# Patient Record
Sex: Female | Born: 1995 | Race: White | Hispanic: No | Marital: Single | Smoking: Current every day smoker
Health system: Southern US, Community
[De-identification: ages and names within clinical notes are randomized; demographics above are authoritative.]

## PROBLEM LIST (undated history)

## (undated) DIAGNOSIS — N390 Urinary tract infection, site not specified: Secondary | ICD-10-CM

## (undated) DIAGNOSIS — F4310 Post-traumatic stress disorder, unspecified: Secondary | ICD-10-CM

## (undated) DIAGNOSIS — I10 Essential (primary) hypertension: Secondary | ICD-10-CM

## (undated) DIAGNOSIS — T7840XA Allergy, unspecified, initial encounter: Secondary | ICD-10-CM

## (undated) DIAGNOSIS — F603 Borderline personality disorder: Secondary | ICD-10-CM

## (undated) DIAGNOSIS — F319 Bipolar disorder, unspecified: Secondary | ICD-10-CM

## (undated) DIAGNOSIS — F32A Depression, unspecified: Secondary | ICD-10-CM

## (undated) DIAGNOSIS — F419 Anxiety disorder, unspecified: Secondary | ICD-10-CM

## (undated) DIAGNOSIS — H1010 Acute atopic conjunctivitis, unspecified eye: Secondary | ICD-10-CM

## (undated) DIAGNOSIS — H52209 Unspecified astigmatism, unspecified eye: Secondary | ICD-10-CM

## (undated) DIAGNOSIS — F329 Major depressive disorder, single episode, unspecified: Secondary | ICD-10-CM

## (undated) DIAGNOSIS — E669 Obesity, unspecified: Secondary | ICD-10-CM

## (undated) HISTORY — DX: Unspecified astigmatism, unspecified eye: H52.209

## (undated) HISTORY — DX: Depression, unspecified: F32.A

## (undated) HISTORY — DX: Obesity, unspecified: E66.9

## (undated) HISTORY — DX: Allergy, unspecified, initial encounter: T78.40XA

## (undated) HISTORY — DX: Anxiety disorder, unspecified: F41.9

## (undated) HISTORY — DX: Urinary tract infection, site not specified: N39.0

## (undated) HISTORY — DX: Post-traumatic stress disorder, unspecified: F43.10

## (undated) HISTORY — DX: Essential (primary) hypertension: I10

## (undated) HISTORY — DX: Major depressive disorder, single episode, unspecified: F32.9

---
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
## 2015-04-05 ENCOUNTER — Emergency Department (HOSPITAL_COMMUNITY)
Admission: EM | Admit: 2015-04-05 | Discharge: 2015-04-05 | Disposition: A | Discharge status: Home / Self Care | Payer: Self-pay | Source: Home / Self Care | Attending: Emergency Medicine | Admitting: Emergency Medicine

## 2015-04-05 ENCOUNTER — Encounter (HOSPITAL_COMMUNITY): Payer: Self-pay | Admitting: *Deleted

## 2015-04-05 ENCOUNTER — Emergency Department (HOSPITAL_COMMUNITY)
Admission: EM | Admit: 2015-04-05 | Discharge: 2015-04-05 | Disposition: A | Discharge status: Home / Self Care | Payer: Self-pay | Attending: Emergency Medicine | Admitting: Emergency Medicine

## 2015-04-05 DIAGNOSIS — E669 Obesity, unspecified: Secondary | ICD-10-CM | POA: Insufficient documentation

## 2015-04-05 DIAGNOSIS — F419 Anxiety disorder, unspecified: Secondary | ICD-10-CM

## 2015-04-05 DIAGNOSIS — F4310 Post-traumatic stress disorder, unspecified: Secondary | ICD-10-CM

## 2015-04-05 DIAGNOSIS — F410 Panic disorder [episodic paroxysmal anxiety] without agoraphobia: Secondary | ICD-10-CM | POA: Insufficient documentation

## 2015-04-05 MED ORDER — HYDROXYZINE HCL 50 MG PO TABS: 50.0000 mg | ORAL_TABLET | Freq: Four times a day (QID) | ORAL | Status: DC

## 2015-04-05 MED ORDER — QUETIAPINE FUMARATE ER 300 MG PO TB24
300.0000 mg | ORAL_TABLET | Freq: Every day | ORAL | Status: DC
Start: 2015-04-05 — End: 2016-11-07
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
## 2015-04-05 NOTE — ED Provider Notes (Signed)
CSN: [id number]     Arrival date & time 04/05/15  1405 History   First MD Initiated Contact with Patient 04/05/15 1514     Chief Complaint  Patient presents with  . Panic Attack   (Consider location/radiation/quality/duration/timing/severity/associated sxs/prior Treatment) HPI History of PTSD, anxiety. Moved from [place]. Out of meds. Not sleeping. Anxiety attack/panic attack last night.  Continues to feel some left over this symptoms today. Unable to see PCP, as she does not have one at this time. States she has not been seen at mental health. Looking for 2 meds. Seroquerl, and visteril.  Past Medical History  Diagnosis Date  . Allergy   . Obesity   . Urinary tract infection 11/16/93    recurrent, nl U/S, [facility])  . Anxiety 06/04/2011    panic attacks  . Depression   . Astigmatism     glasses  . Post traumatic stress disorder    Past Surgical History  Procedure Laterality Date  . Inguinal hernia repair  7/95    bilat exploration, left repair  . Umbilical hernia repair  7/95    repaired as infant   Family History  Problem Relation Age of Onset  . Hypertension Mother   . Hypothyroidism Mother   . Hypertension Father   . Brain cancer Maternal Grandfather   . Cancer Other    Social History  Substance Use Topics  . Smoking status: Passive Smoke Exposure - Never Smoker  . Smokeless tobacco: Never Used  . Alcohol Use: No   OB History    No data available     Review of Systems Anxiety, ptsd, medication refill Allergies  Penicillins  Home Medications   Prior to Admission medications   Medication Sig Start Date End Date Taking? Authorizing Provider  aspirin 325 MG tablet Take 325 mg by mouth every 6 (six) hours as needed. For pain.    Historical Provider, MD  citalopram (CELEXA) 40 MG tablet Take 40 mg by mouth daily.    Historical Provider, MD  hydrOXYzine (ATARAX/VISTARIL) 50 MG tablet Take 1 tablet (50 mg total) by mouth every 6 (six) hours. 04/05/15   Tharon Aquas, PA   [facility] 1/35 1-35 MG-MCG tablet take 1 tablet by mouth once daily 09/27/11   Preston Fleeting, MD  Olopatadine HCl 0.2 % SOLN Apply 1 drop to eye 2 (two) times daily as needed (for itchy , watery, irritated eyes). 10/10/11   Preston Fleeting, MD  PRESCRIPTION MEDICATION Take 1 capsule by mouth at bedtime. "Nightmare" medication, starts with a P.    Historical Provider, MD  QUEtiapine (SEROQUEL XR) 300 MG 24 hr tablet Take 1 tablet (300 mg total) by mouth at bedtime. 04/05/15   Tharon Aquas, PA  traZODone (DESYREL) 100 MG tablet Take 100 mg by mouth at bedtime.    Historical Provider, MD   Meds Ordered and Administered this Visit  Medications - No data to display  BP 110/70 mmHg  Pulse 107  Temp(Src) 98.6 F (37 C) (Oral)  Resp 18  SpO2 98%  LMP 04/05/2015 No data found.   Physical Exam NURSES NOTES AND VITAL SIGNS REVIEWED. CONSTITUTIONAL: Well developed, well nourished, no acute distress HEENT: normocephalic, atraumatic, right and left TM's are normal EYES: Conjunctiva normal NECK:normal ROM, supple, no adenopathy PULMONARY:No respiratory distress, normal effort, Lungs: CTAb/l, no wheezes, or increased work of breathing CARDIOVASCULAR: RRR, no murmur ABDOMEN: soft, ND, NT, +'ve BS MUSCULOSKELETAL: Normal ROM of all extremities,  SKIN: warm and dry  without rash PSYCHIATRIC: Mood and affect, behavior are normal, may be a bit depressed, no suidical or homicidal thoughts. No desire to hurt self.  ED Course  Procedures (including critical care time)  Labs Review Labs Reviewed - No data to display  Imaging Review No results found.   Visual Acuity Review  Right Eye Distance:   Left Eye Distance:   Bilateral Distance:    Right Eye Near:   Left Eye Near:    Bilateral Near:         MDM   1. Anxiety   2. PTSD (post-traumatic stress disorder)     Patient is reassured that there are no issues that require transfer to higher level of care at this time or additional  tests. Patient is advised to continue home symptomatic treatment. Patient is advised that if there are new or worsening symptoms to attend the emergency department, contact primary care provider, or return to UC. Instructions of care provided discharged home in stable condition. Return to work/school note provided.   THIS NOTE WAS GENERATED USING A VOICE RECOGNITION SOFTWARE PROGRAM. ALL REASONABLE EFFORTS  WERE MADE TO PROOFREAD THIS DOCUMENT FOR ACCURACY.  I have verbally reviewed the discharge instructions with the patient. A printed AVS was given to the patient.  All questions were answered prior to discharge.      Tharon AquasFrank C Donja Tipping, PA 04/05/15 1742
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
## 2015-04-05 NOTE — ED Notes (Signed)
Pt reports    Feeling  Anxious  Reports  Recent  Stressors to  [employer]  Financial    Domestic  And      Not   On  Her meds    For  About  1  Month      Pt   Reports  A  Sensation of  heavyness in her  Chest    And  Nervous   She  Was  In the  Er     Today  And  Left  Without  Being  Seen  Due  To the  Long  Wait

## 2016-08-17 ENCOUNTER — Emergency Department: Payer: Self-pay

## 2016-08-17 ENCOUNTER — Emergency Department
Admission: EM | Admit: 2016-08-17 | Discharge: 2016-08-17 | Disposition: A | Discharge status: Home / Self Care | Payer: Self-pay | Attending: Emergency Medicine | Admitting: Emergency Medicine

## 2016-08-17 ENCOUNTER — Encounter: Payer: Self-pay | Admitting: Emergency Medicine

## 2016-08-17 DIAGNOSIS — R109 Unspecified abdominal pain: Secondary | ICD-10-CM

## 2016-08-17 DIAGNOSIS — N3000 Acute cystitis without hematuria: Secondary | ICD-10-CM | POA: Insufficient documentation

## 2016-08-17 DIAGNOSIS — R300 Dysuria: Secondary | ICD-10-CM | POA: Insufficient documentation

## 2016-08-17 DIAGNOSIS — F17210 Nicotine dependence, cigarettes, uncomplicated: Secondary | ICD-10-CM | POA: Insufficient documentation

## 2016-08-17 DIAGNOSIS — Z79899 Other long term (current) drug therapy: Secondary | ICD-10-CM | POA: Insufficient documentation

## 2016-08-17 DIAGNOSIS — Z7982 Long term (current) use of aspirin: Secondary | ICD-10-CM | POA: Insufficient documentation

## 2016-08-17 HISTORY — DX: Acute atopic conjunctivitis, unspecified eye: H10.10

## 2016-08-17 LAB — URINALYSIS, COMPLETE (UACMP) WITH MICROSCOPIC
BACTERIA UA: NONE SEEN
BILIRUBIN URINE: NEGATIVE
GLUCOSE, UA: NEGATIVE mg/dL
HGB URINE DIPSTICK: NEGATIVE
Ketones, ur: 20 mg/dL — AB
LEUKOCYTES UA: NEGATIVE
NITRITE: NEGATIVE
PH: 5 (ref 5.0–8.0)
Protein, ur: 30 mg/dL — AB
Specific Gravity, Urine: 1.026 (ref 1.005–1.030)

## 2016-08-17 LAB — BASIC METABOLIC PANEL
Anion gap: 10 (ref 5–15)
BUN: 14 mg/dL (ref 6–20)
CO2: 24 mmol/L (ref 22–32)
Calcium: 9.5 mg/dL (ref 8.9–10.3)
Chloride: 105 mmol/L (ref 101–111)
Creatinine, Ser: 0.74 mg/dL (ref 0.44–1.00)
GFR calc Af Amer: >60 mL/min (ref >60)
GFR calc non Af Amer: >60 mL/min (ref >60)
GLUCOSE: 88 mg/dL (ref 65–99)
Potassium: 3.9 mmol/L (ref 3.5–5.1)
Sodium: 139 mmol/L (ref 135–145)

## 2016-08-17 LAB — CBC
HEMATOCRIT: 41.1 % (ref 35.0–47.0)
Hemoglobin: 14.3 g/dL (ref 12.0–16.0)
MCH: 32 pg (ref 26.0–34.0)
MCHC: 34.8 g/dL (ref 32.0–36.0)
MCV: 91.9 fL (ref 80.0–100.0)
Platelets: 245 10*3/uL (ref 150–440)
RBC: 4.48 MIL/uL (ref 3.80–5.20)
RDW: 13.5 % (ref 11.5–14.5)
WBC: 8.3 10*3/uL (ref 3.6–11.0)

## 2016-08-17 LAB — POCT PREGNANCY, URINE: Preg Test, Ur: NEGATIVE

## 2016-08-17 MED ORDER — CIPROFLOXACIN HCL 500 MG PO TABS
500.0000 mg | ORAL_TABLET | Freq: Two times a day (BID) | ORAL | 0 refills | Status: DC
Start: 2016-08-17 — End: 2016-11-07

## 2016-08-17 NOTE — ED Triage Notes (Signed)
Pt c/o left flank pain that started about 3 days ago.  C/o dysuria with urination. Pt states she has trouble urinating as well.  Pt c/o nausea as well.  Denies any vomiting

## 2016-08-17 NOTE — ED Notes (Signed)
Pt also states she may be pregnant as well

## 2016-08-17 NOTE — ED Provider Notes (Signed)
[facility] Emergency Department Provider Note   ____________________________________________    I have reviewed the triage vital signs and the nursing notes.   HISTORY  Chief Complaint Flank Pain     HPI [name] is a 21 y.o. female complains of dysuria and mild right flank pain. Patient reports the symptoms started 2 days ago. She has had urinary tract infections in the past but reports this was different because she has no discomfort in her right side. No history of abdominal surgeries. No fevers or chills. No nausea or vomiting. No history of kidney stones. She describes the pain as cramping and mild to moderate. She does report urinary frequency dysuria and urgency   Past Medical History:  Diagnosis Date  . Allergic conjunctivitis   . Allergy   . Anxiety 06/04/2011   panic attacks  . Astigmatism    glasses  . Depression   . Obesity   . Post traumatic stress disorder   . Urinary tract infection 01/31/1993   recurrent, nl [facility], [facility])    Patient Active Problem List   Diagnosis Date Noted  . Allergic conjunctivitis 10/13/2011  . Well adolescent visit 07/14/2011  . PTSD (post-traumatic stress disorder) 07/14/2011  . Dysmenorrhea in the adolescent 07/14/2011  . Allergic rhinitis, mild 07/14/2011  . Anxiety 06/04/2011  . Depression 06/04/2011  . BMI (body mass index), pediatric, 95-99% for age 105/08/2011    Past Surgical History:  Procedure Laterality Date  . INGUINAL HERNIA REPAIR  7/95   bilat exploration, left repair  . UMBILICAL HERNIA REPAIR  7/95   repaired as infant    Prior to Admission medications   Medication Sig Start Date End Date Taking? Authorizing Provider  aspirin 325 MG tablet Take 325 mg by mouth every 6 (six) hours as needed. For pain.    [provider]  ciprofloxacin (CIPRO) 500 MG tablet Take 1 tablet (500 mg total) by mouth 2 (two) times daily. 08/17/16   Jene Every, MD  citalopram (CELEXA) 40  MG tablet Take 40 mg by mouth daily.    [provider]  hydrOXYzine (ATARAX/VISTARIL) 50 MG tablet Take 1 tablet (50 mg total) by mouth every 6 (six) hours. 04/05/15   Tharon Aquas, PA  [facility] 1/35 1-35 MG-MCG tablet take 1 tablet by mouth once daily 09/27/11   Preston Fleeting, MD  Olopatadine HCl 0.2 % SOLN Apply 1 drop to eye 2 (two) times daily as needed (for itchy , watery, irritated eyes). 10/10/11   Preston Fleeting, MD  PRESCRIPTION MEDICATION Take 1 capsule by mouth at bedtime. "Nightmare" medication, starts with a P.    [provider]  QUEtiapine (SEROQUEL XR) 300 MG 24 hr tablet Take 1 tablet (300 mg total) by mouth at bedtime. 04/05/15   Tharon Aquas, PA  traZODone (DESYREL) 100 MG tablet Take 100 mg by mouth at bedtime.    [provider]     Allergies Penicillins  Family History  Problem Relation Age of Onset  . Hypertension Mother   . Hypothyroidism Mother   . Hypertension Father   . Brain cancer Maternal Grandfather   . Cancer Other     Social History Social History  Substance Use Topics  . Smoking status: Current Every Day Smoker    Packs/day: 1.00    Types: Cigarettes  . Smokeless tobacco: Never Used  . Alcohol use No    Review of Systems  Constitutional: No fever/chills Eyes:  No visual changes.  ENT: No sore throat. Cardiovascular: Denies chest pain. Respiratory: Denies shortness of breath. Gastrointestinal No nausea, no vomiting.   Genitourinary:As above Musculoskeletal: Negative for back pain. Skin: Negative for rash. Neurological: Negative for headaches   ____________________________________________   PHYSICAL EXAM:  VITAL SIGNS: ED Triage Vitals  Enc Vitals Group     BP 08/17/16 0941 125/84     Pulse Rate 08/17/16 0941 90     Resp 08/17/16 0941 16     Temp 08/17/16 0941 98.6 F (37 C)     Temp Source 08/17/16 0941 Oral     SpO2 08/17/16 0941 100 %     Weight 08/17/16 0941 59 kg (130 lb)     Height  08/17/16 0941 1.651 m (5\' 5" )     Head Circumference --      Peak Flow --      Pain Score 08/17/16 1319 0     Pain Loc --      Pain Edu? --      Excl. in GC? --     Constitutional: Alert and oriented. No acute distress. Pleasant and interactive Eyes: Conjunctivae are normal.   Nose: No congestion/rhinnorhea. Mouth/Throat: Mucous membranes are moist.    Cardiovascular: Normal rate, regular rhythm.   Good peripheral circulation. Respiratory: Normal respiratory effort.  No retractions. Gastrointestinal: Soft and nontender. No distention.  No CVA tenderness Genitourinary: deferred Musculoskeletal: No lower extremity tenderness nor edema.  Warm and well perfused Neurologic:  Normal speech and language. No gross focal neurologic deficits are appreciated.  Skin:  Skin is warm, dry and intact. No rash noted. Psychiatric: Mood and affect are normal. Speech and behavior are normal.  ____________________________________________   LABS (all labs ordered are listed, but only abnormal results are displayed)  Labs Reviewed  URINALYSIS, COMPLETE (UACMP) WITH MICROSCOPIC - Abnormal; Notable for the following:       Result Value   Color, Urine YELLOW (*)    APPearance HAZY (*)    Ketones, ur 20 (*)    Protein, ur 30 (*)    Squamous Epithelial / LPF 0-5 (*)    All other components within normal limits  URINE CULTURE  BASIC METABOLIC PANEL  CBC  POC URINE PREG, ED  POCT PREGNANCY, URINE   ____________________________________________  EKG  None ____________________________________________  RADIOLOGY  CT renal stone study normal ____________________________________________   PROCEDURES  Procedure(s) performed: No    Critical Care performed: No ____________________________________________   INITIAL IMPRESSION / ASSESSMENT AND PLAN / ED COURSE  Pertinent labs & imaging results that were available during my care of the patient were reviewed by me and considered in my medical  decision making (see chart for details).  Patient well-appearing no acute distress, lab work is reassuring, urinalysis shows some white blood cells, no obvious bacteria however her symptoms are consistent with UTI. CT stone study is negative will treat with antibiotics and have her follow-up with PCP. Return precautions discussed    ____________________________________________   FINAL CLINICAL IMPRESSION(S) / ED DIAGNOSES  Final diagnoses:  Acute right flank pain  Acute cystitis without hematuria      NEW MEDICATIONS STARTED DURING THIS VISIT:  Discharge Medication List as of 08/17/2016  1:46 PM    START taking these medications   Details  ciprofloxacin (CIPRO) 500 MG tablet Take 1 tablet (500 mg total) by mouth 2 (two) times daily., Starting Sun 08/17/2016, Print         Note:  This document was  prepared using Conservation officer, historic buildings and may include unintentional dictation errors.    Jene Every, MD 08/17/16 2013

## 2016-08-19 LAB — URINE CULTURE

## 2016-11-06 ENCOUNTER — Encounter (HOSPITAL_COMMUNITY): Payer: Self-pay

## 2016-11-06 ENCOUNTER — Inpatient Hospital Stay (HOSPITAL_COMMUNITY)
Admission: RE | Admit: 2016-11-06 | Discharge: 2016-11-11 | DRG: 885 | Disposition: A | Discharge status: Home / Self Care | Payer: Federal, State, Local not specified - Other | Attending: Psychiatry | Admitting: Psychiatry

## 2016-11-06 DIAGNOSIS — F17210 Nicotine dependence, cigarettes, uncomplicated: Secondary | ICD-10-CM | POA: Diagnosis present

## 2016-11-06 DIAGNOSIS — R45851 Suicidal ideations: Secondary | ICD-10-CM | POA: Diagnosis present

## 2016-11-06 DIAGNOSIS — F3132 Bipolar disorder, current episode depressed, moderate: Secondary | ICD-10-CM | POA: Diagnosis not present

## 2016-11-06 DIAGNOSIS — F1210 Cannabis abuse, uncomplicated: Secondary | ICD-10-CM | POA: Diagnosis present

## 2016-11-06 DIAGNOSIS — Z818 Family history of other mental and behavioral disorders: Secondary | ICD-10-CM | POA: Diagnosis not present

## 2016-11-06 DIAGNOSIS — F314 Bipolar disorder, current episode depressed, severe, without psychotic features: Principal | ICD-10-CM | POA: Diagnosis present

## 2016-11-06 DIAGNOSIS — F4310 Post-traumatic stress disorder, unspecified: Secondary | ICD-10-CM | POA: Diagnosis present

## 2016-11-06 DIAGNOSIS — G4700 Insomnia, unspecified: Secondary | ICD-10-CM | POA: Diagnosis present

## 2016-11-06 DIAGNOSIS — Z560 Unemployment, unspecified: Secondary | ICD-10-CM

## 2016-11-06 DIAGNOSIS — F419 Anxiety disorder, unspecified: Secondary | ICD-10-CM | POA: Diagnosis not present

## 2016-11-06 DIAGNOSIS — F329 Major depressive disorder, single episode, unspecified: Secondary | ICD-10-CM | POA: Diagnosis present

## 2016-11-06 DIAGNOSIS — Z23 Encounter for immunization: Secondary | ICD-10-CM

## 2016-11-06 DIAGNOSIS — Z915 Personal history of self-harm: Secondary | ICD-10-CM

## 2016-11-06 DIAGNOSIS — K5900 Constipation, unspecified: Secondary | ICD-10-CM | POA: Diagnosis present

## 2016-11-06 DIAGNOSIS — F410 Panic disorder [episodic paroxysmal anxiety] without agoraphobia: Secondary | ICD-10-CM | POA: Diagnosis present

## 2016-11-06 DIAGNOSIS — F39 Unspecified mood [affective] disorder: Secondary | ICD-10-CM | POA: Diagnosis not present

## 2016-11-06 MED ORDER — ALUM & MAG HYDROXIDE-SIMETH 200-200-20 MG/5ML PO SUSP: 30.0000 mL | ORAL | Status: DC | PRN

## 2016-11-06 MED ORDER — ACETAMINOPHEN 325 MG PO TABS
650.0000 mg | ORAL_TABLET | Freq: Four times a day (QID) | ORAL | Status: DC | PRN
  Administered 2016-11-09 – 2016-11-11 (×2): 650 mg via ORAL
  Filled 2016-11-06 (×2): qty 2

## 2016-11-06 MED ORDER — MAGNESIUM HYDROXIDE 400 MG/5ML PO SUSP
30.0000 mL | Freq: Every day | ORAL | Status: DC | PRN
  Administered 2016-11-10: 30 mL via ORAL
  Filled 2016-11-06: qty 30

## 2016-11-06 MED ORDER — TRAZODONE HCL 50 MG PO TABS
50.0000 mg | ORAL_TABLET | Freq: Every evening | ORAL | Status: DC | PRN
  Administered 2016-11-06 – 2016-11-10 (×4): 50 mg via ORAL
  Filled 2016-11-06: qty 1
  Filled 2016-11-06: qty 7
  Filled 2016-11-06 (×2): qty 1

## 2016-11-06 MED ORDER — HYDROXYZINE HCL 25 MG PO TABS
25.0000 mg | ORAL_TABLET | Freq: Three times a day (TID) | ORAL | Status: DC | PRN
  Administered 2016-11-06 – 2016-11-10 (×4): 25 mg via ORAL
  Filled 2016-11-06 (×2): qty 1
  Filled 2016-11-06: qty 10
  Filled 2016-11-06: qty 1

## 2016-11-06 NOTE — H&P (Signed)
Behavioral Health Medical Screening Exam  [name] is an 20 y.o. female.  Total Time spent with patient: 15 minutes  Psychiatric Specialty Exam: Physical Exam  Constitutional: She is oriented to person, place, and time. She appears well-developed and well-nourished. No distress.  HENT:  Head: Normocephalic and atraumatic.  Right Ear: External ear normal.  Left Ear: External ear normal.  Eyes: Conjunctivae are normal. Right eye exhibits no discharge. Left eye exhibits no discharge. No scleral icterus.  Cardiovascular: Normal rate, regular rhythm and normal heart sounds.   Respiratory: Breath sounds normal. No respiratory distress.  Musculoskeletal: Normal range of motion.  Neurological: She is alert and oriented to person, place, and time.  Skin: Skin is warm and dry. She is not diaphoretic.  Psychiatric: Her mood appears anxious. Her affect is blunt. Her affect is not labile. She is not aggressive, not hyperactive and not actively hallucinating. Thought content is not paranoid and not delusional. She expresses impulsivity and inappropriate judgment. She exhibits a depressed mood. She expresses suicidal ideation. She expresses no homicidal ideation. She expresses suicidal plans. She is attentive.    Review of Systems  Psychiatric/Behavioral: Positive for depression, substance abuse and suicidal ideas. Negative for hallucinations and memory loss. The patient is nervous/anxious and has insomnia.   All other systems reviewed and are negative.   Blood pressure 115/72, pulse (!) 110, resp. rate 18, SpO2 100 %.There is no height or weight on file to calculate BMI.  General Appearance: Casual and Well Groomed  Eye Contact:  Fair  Speech:  Clear and Coherent and Normal Rate  Volume:  Decreased  Mood:  Anxious, Depressed, Dysphoric, Hopeless, Irritable and Worthless  Affect:  Blunt  Thought Process:  Coherent and Goal Directed  Orientation:  Full (Time, Place, and Person)  Thought  Content:  Logical and Hallucinations: None  Suicidal Thoughts:  Yes.  with intent/plan  Homicidal Thoughts:  No  Memory:  Immediate;   Good Recent;   Good Remote;   Good  Judgement:  Fair  Insight:  Fair  Psychomotor Activity:  Normal  Concentration: Concentration: Fair and Attention Span: Fair  Recall:  Good  Fund of Knowledge:Good  Language: Good  Akathisia:  No  Handed:  Right  AIMS (if indicated):     Assets:  Communication Skills Desire for Improvement Financial Resources/Insurance Housing Intimacy Leisure Time Physical Health  Sleep:       Musculoskeletal: Strength & Muscle Tone: within normal limits Gait & Station: normal   Blood pressure 115/72, pulse (!) 110, resp. rate 18, SpO2 100 %.  Recommendations:  Based on my evaluation the patient does not appear to have an emergency medical condition.  Jackelyn Poling, NP 11/06/2016, 9:57 PM
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
## 2016-11-07 DIAGNOSIS — R4587 Impulsiveness: Secondary | ICD-10-CM

## 2016-11-07 DIAGNOSIS — F410 Panic disorder [episodic paroxysmal anxiety] without agoraphobia: Secondary | ICD-10-CM

## 2016-11-07 DIAGNOSIS — Z62811 Personal history of psychological abuse in childhood: Secondary | ICD-10-CM

## 2016-11-07 DIAGNOSIS — Z62810 Personal history of physical and sexual abuse in childhood: Secondary | ICD-10-CM

## 2016-11-07 DIAGNOSIS — F1210 Cannabis abuse, uncomplicated: Secondary | ICD-10-CM

## 2016-11-07 DIAGNOSIS — R450 Nervousness: Secondary | ICD-10-CM

## 2016-11-07 DIAGNOSIS — Z91410 Personal history of adult physical and sexual abuse: Secondary | ICD-10-CM

## 2016-11-07 DIAGNOSIS — G4700 Insomnia, unspecified: Secondary | ICD-10-CM

## 2016-11-07 DIAGNOSIS — F17210 Nicotine dependence, cigarettes, uncomplicated: Secondary | ICD-10-CM

## 2016-11-07 DIAGNOSIS — R5383 Other fatigue: Secondary | ICD-10-CM

## 2016-11-07 DIAGNOSIS — F3132 Bipolar disorder, current episode depressed, moderate: Secondary | ICD-10-CM

## 2016-11-07 DIAGNOSIS — F419 Anxiety disorder, unspecified: Secondary | ICD-10-CM

## 2016-11-07 DIAGNOSIS — F4010 Social phobia, unspecified: Secondary | ICD-10-CM

## 2016-11-07 DIAGNOSIS — Z560 Unemployment, unspecified: Secondary | ICD-10-CM

## 2016-11-07 LAB — CBC
HEMATOCRIT: 42.7 % (ref 36.0–46.0)
HEMOGLOBIN: 14.4 g/dL (ref 12.0–15.0)
MCH: 31.9 pg (ref 26.0–34.0)
MCHC: 33.7 g/dL (ref 30.0–36.0)
MCV: 94.7 fL (ref 78.0–100.0)
Platelets: 260 10*3/uL (ref 150–400)
RBC: 4.51 MIL/uL (ref 3.87–5.11)
RDW: 13 % (ref 11.5–15.5)
WBC: 7.7 10*3/uL (ref 4.0–10.5)

## 2016-11-07 LAB — COMPREHENSIVE METABOLIC PANEL
ALBUMIN: 4.6 g/dL (ref 3.5–5.0)
ALK PHOS: 57 U/L (ref 38–126)
ALT: 10 U/L — ABNORMAL LOW (ref 14–54)
ANION GAP: 11 (ref 5–15)
AST: 13 U/L — ABNORMAL LOW (ref 15–41)
BILIRUBIN TOTAL: 1.8 mg/dL — AB (ref 0.3–1.2)
BUN: 21 mg/dL — ABNORMAL HIGH (ref 6–20)
CALCIUM: 9.6 mg/dL (ref 8.9–10.3)
CO2: 28 mmol/L (ref 22–32)
Chloride: 101 mmol/L (ref 101–111)
Creatinine, Ser: 0.82 mg/dL (ref 0.44–1.00)
GFR calc Af Amer: >60 mL/min (ref >60)
GLUCOSE: 108 mg/dL — AB (ref 65–99)
POTASSIUM: 3.9 mmol/L (ref 3.5–5.1)
Sodium: 140 mmol/L (ref 135–145)
TOTAL PROTEIN: 7.3 g/dL (ref 6.5–8.1)

## 2016-11-07 LAB — LIPID PANEL
CHOLESTEROL: 168 mg/dL (ref 0–200)
HDL: 54 mg/dL (ref >40)
LDL CALC: 96 mg/dL (ref 0–99)
Total CHOL/HDL Ratio: 3.1 RATIO
Triglycerides: 92 mg/dL (ref <150)
VLDL: 18 mg/dL (ref 0–40)

## 2016-11-07 LAB — HEMOGLOBIN A1C
HEMOGLOBIN A1C: 4.9 % (ref 4.8–5.6)
Mean Plasma Glucose: 93.93 mg/dL

## 2016-11-07 LAB — TSH: TSH: 1.554 u[IU]/mL (ref 0.350–4.500)

## 2016-11-07 MED ORDER — LORAZEPAM 0.5 MG PO TABS
0.5000 mg | ORAL_TABLET | Freq: Four times a day (QID) | ORAL | Status: DC | PRN
  Administered 2016-11-09: 0.5 mg via ORAL
  Filled 2016-11-07: qty 1

## 2016-11-07 MED ORDER — BOOST / RESOURCE BREEZE PO LIQD
1.0000 | Freq: Two times a day (BID) | ORAL | Status: DC
  Administered 2016-11-07 – 2016-11-09 (×4): 1 via ORAL
  Filled 2016-11-07 (×13): qty 1

## 2016-11-07 MED ORDER — VENLAFAXINE HCL ER 37.5 MG PO CP24
37.5000 mg | ORAL_CAPSULE | Freq: Every day | ORAL | Status: DC
  Administered 2016-11-07 – 2016-11-11 (×5): 37.5 mg via ORAL
  Filled 2016-11-07 (×3): qty 1
  Filled 2016-11-07: qty 7
  Filled 2016-11-07 (×4): qty 1

## 2016-11-07 MED ORDER — NICOTINE POLACRILEX 2 MG MT GUM
2.0000 mg | CHEWING_GUM | OROMUCOSAL | Status: DC | PRN
  Administered 2016-11-07 – 2016-11-09 (×3): 2 mg via ORAL
  Filled 2016-11-07 (×2): qty 1

## 2016-11-07 MED ORDER — ZIPRASIDONE HCL 20 MG PO CAPS
20.0000 mg | ORAL_CAPSULE | Freq: Two times a day (BID) | ORAL | Status: DC
  Administered 2016-11-07 – 2016-11-11 (×8): 20 mg via ORAL
  Filled 2016-11-07 (×6): qty 1
  Filled 2016-11-07: qty 14
  Filled 2016-11-07 (×2): qty 1
  Filled 2016-11-07: qty 14
  Filled 2016-11-07 (×2): qty 1

## 2016-11-07 NOTE — Tx Team (Signed)
Interdisciplinary Treatment and Diagnostic Plan Update 11/07/2016 Time of Session: 9:30am  [name]  MRN: [id number]  Principal Diagnosis: Bipolar 1 disorder, depressed, severe ([place])  Secondary Diagnoses: Principal Problem:   Bipolar 1 disorder, depressed, severe ([place]) Active Problems:   Bipolar affective disorder, currently depressed, moderate ([place])   Current Medications:  Current Facility-Administered Medications  Medication Dose Route Frequency Provider Last Rate Last Dose  . acetaminophen (TYLENOL) tablet 650 mg  650 mg Oral Q6H PRN Lindon Romp A, NP      . alum & mag hydroxide-simeth (MAALOX/MYLANTA) 200-200-20 MG/5ML suspension 30 mL  30 mL Oral Q4H PRN Lindon Romp A, NP      . feeding supplement (BOOST / RESOURCE BREEZE) liquid 1 Container  1 Container Oral BID BM Cobos, Myer Peer, MD   1 Container at 11/07/16 0949  . hydrOXYzine (ATARAX/VISTARIL) tablet 25 mg  25 mg Oral TID PRN Rozetta Nunnery, NP   25 mg at 11/06/16 2347  . LORazepam (ATIVAN) tablet 0.5 mg  0.5 mg Oral Q6H PRN Cobos, Fernando A, MD      . magnesium hydroxide (MILK OF MAGNESIA) suspension 30 mL  30 mL Oral Daily PRN Lindon Romp A, NP      . nicotine polacrilex (NICORETTE) gum 2 mg  2 mg Oral PRN Cobos, Myer Peer, MD   2 mg at 11/07/16 1255  . traZODone (DESYREL) tablet 50 mg  50 mg Oral QHS PRN Lindon Romp A, NP   50 mg at 11/06/16 2347  . venlafaxine XR (EFFEXOR-XR) 24 hr capsule 37.5 mg  37.5 mg Oral Q breakfast Cobos, Myer Peer, MD   37.5 mg at 11/07/16 1253  . ziprasidone (GEODON) capsule 20 mg  20 mg Oral BID WC Cobos, Myer Peer, MD        PTA Medications: Prescriptions Prior to Admission  Medication Sig Dispense Refill Last Dose  . traZODone (DESYREL) 100 MG tablet Take 100 mg by mouth at bedtime.   Past Month at Unknown time    Treatment Modalities: Medication Management, Group therapy, Case management,  1 to 1 session with clinician, Psychoeducation, Recreational therapy.  Patient  Stressors: Arts development officer issue Medication change or noncompliance Occupational concerns Patient Strengths: Capable of independent living Armed forces logistics/support/administrative officer Physical Health Supportive family/friends  Physician Treatment Plan for Primary Diagnosis: Bipolar 1 disorder, depressed, severe ([place]) Long Term Goal(s): Improvement in symptoms so as ready for discharge Short Term Goals: Ability to identify and develop effective coping behaviors will improve Compliance with prescribed medications will improve Ability to verbalize feelings will improve Ability to disclose and discuss suicidal ideas  Medication Management: Evaluate patient's response, side effects, and tolerance of medication regimen.  Therapeutic Interventions: 1 to 1 sessions, Unit Group sessions and Medication administration.  Evaluation of Outcomes: Not Met  Physician Treatment Plan for Secondary Diagnosis: Principal Problem:   Bipolar 1 disorder, depressed, severe ([place]) Active Problems:   Bipolar affective disorder, currently depressed, moderate ([place])  Long Term Goal(s): Improvement in symptoms so as ready for discharge  Short Term Goals: Ability to identify and develop effective coping behaviors will improve Compliance with prescribed medications will improve Ability to verbalize feelings will improve Ability to disclose and discuss suicidal ideas  Medication Management: Evaluate patient's response, side effects, and tolerance of medication regimen.  Therapeutic Interventions: 1 to 1 sessions, Unit Group sessions and Medication administration.  Evaluation of Outcomes: Not Met  RN Treatment Plan for Primary Diagnosis: Bipolar 1 disorder, depressed, severe ([place]) Long  Term Goal(s): Knowledge of disease and therapeutic regimen to maintain health will improve  Short Term Goals: Ability to disclose and discuss suicidal ideas and Compliance with prescribed medications will improve  Medication Management:  RN will administer medications as ordered by provider, will assess and evaluate patient's response and provide education to patient for prescribed medication. RN will report any adverse and/or side effects to prescribing provider.  Therapeutic Interventions: 1 on 1 counseling sessions, Psychoeducation, Medication administration, Evaluate responses to treatment, Monitor vital signs and CBGs as ordered, Perform/monitor CIWA, COWS, AIMS and Fall Risk screenings as ordered, Perform wound care treatments as ordered.  Evaluation of Outcomes: Not Met  LCSW Treatment Plan for Primary Diagnosis: Bipolar 1 disorder, depressed, severe ([place]) Long Term Goal(s): Safe transition to appropriate next level of care at discharge, Engage patient in therapeutic group addressing interpersonal concerns. Short Term Goals: Engage patient in aftercare planning with referrals and resources, Increase emotional regulation, Identify triggers associated with mental health/substance abuse issues and Increase skills for wellness and recovery  Therapeutic Interventions: Assess for all discharge needs, 1 to 1 time with Social worker, Explore available resources and support systems, Assess for adequacy in community support network, Educate family and significant other(s) on suicide prevention, Complete Psychosocial Assessment, Interpersonal group therapy.  Evaluation of Outcomes: Not Met  Progress in Treatment: Attending groups: Pt is new to milieu, continuing to assess  Participating in groups: Pt is new to milieu, continuing to assess  Taking medication as prescribed: Yes, MD continues to assess for medication changes as needed Toleration medication: Yes, no side effects reported at this time Family/Significant other contact made: No, CSW assessing for appropriate contact Patient understands diagnosis: Continuing to assess Discussing patient identified problems/goals with staff: Yes Medical problems stabilized or resolved:  Yes Denies suicidal/homicidal ideation: No, pt recently admitted with SI. Issues/concerns per patient self-inventory: None Other: N/A  New problem(s) identified: None identified at this time.   New Short Term/Long Term Goal(s): None identified at this time.   Discharge Plan or Barriers: CSW still assessing for an appropriate plan.  Reason for Continuation of Hospitalization:  Anxiety  Depression Medication stabilization Suicidal ideation  Estimated Length of Stay: 3-5 days; Estimated discharge date 11/12/16  Attendees: Patient: 11/07/2016 1:22 PM  Physician: Dr. Parke Poisson 11/07/2016 1:22 PM  Nursing: Kieth Brightly, RN 11/07/2016 1:22 PM  RN Care Manager: Lars Pinks, RN 11/07/2016 1:22 PM  Social Worker: Matthew Saras, [place] 11/07/2016 1:22 PM  Recreational Therapist:  11/07/2016 1:22 PM  Other: Lindell Spar, NP 11/07/2016 1:22 PM  Other:  11/07/2016 1:22 PM  Other: 11/07/2016 1:22 PM  Scribe for Treatment Team: Georga Kaufmann, MSW,LCSWA 11/07/2016 1:22 PM
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
## 2016-11-07 NOTE — BHH Group Notes (Signed)
LCSW Group Therapy Note  11/07/2016 1:15pm  Type of Therapy and Topic:  Group Therapy:  Feelings around Relapse and Recovery  Participation Level: Active   Description of Group:    Patients in this group will discuss emotions they experience before and after a relapse. They will process how experiencing these feelings, or avoidance of experiencing them, relates to having a relapse. Facilitator will guide patients to explore emotions they have related to recovery. Patients will be encouraged to process which emotions are more powerful. They will be guided to discuss the emotional reaction significant others in their lives may have to their relapse or recovery. Patients will be assisted in exploring ways to respond to the emotions of others without this contributing to a relapse.  Therapeutic Goals: 1. Patient will identify two or more emotions that lead to a relapse for them 2. Patient will identify two emotions that result when they relapse 3. Patient will identify two emotions related to recovery 4. Patient will demonstrate ability to communicate their needs through discussion and/or role plays   Summary of Patient Progress: Pt was present for the duration of the group and was an active participant. Pt states that she continues to relapse with her mental health because she feels she doesn't have a support system that truly understands what she is going through. Pt states that she has struggled with Anxiety and Borderline Personality Disorder for years and she feels like she is in a "black hole" because of them. Pt showed a great deal of insight and her affect was appropriate.     Therapeutic Modalities:   Cognitive Behavioral Therapy Solution-Focused Therapy Assertiveness Training Relapse Prevention Therapy   Jonathon Jordan, MSW, LCSWA 11/07/2016 3:20 PM

## 2016-11-07 NOTE — Tx Team (Signed)
Initial Treatment Plan 11/07/2016 1:36 AM [name] ZOX:[id number]    PATIENT STRESSORS: Financial difficulties Legal issue Medication change or noncompliance Occupational concerns   PATIENT STRENGTHS: Capable of independent living Communication skills Physical Health Supportive family/friends   PATIENT IDENTIFIED PROBLEMS: "Get my med straight"  "Need help with my coping skills"   Depression  Anxiety  Risk for suicide             DISCHARGE CRITERIA:  Ability to meet basic life and health needs Safe-care adequate arrangements made Verbal commitment to aftercare and medication compliance  PRELIMINARY DISCHARGE PLAN: Return to previous living arrangement  PATIENT/FAMILY INVOLVEMENT: This treatment plan has been presented to and reviewed with the patient, [name], and/or family member.  The patient and family have been given the opportunity to ask questions and make suggestions.  Eveline Keto, RN 11/07/2016, 1:36 AM

## 2016-11-07 NOTE — Progress Notes (Signed)
Recreation Therapy Notes  Date: 11/07/16 Time: 0930 Location: 300 Hall Group Room  Group Topic: Stress Management  Goal Area(s) Addresses:  Patient will verbalize importance of using healthy stress management.  Patient will identify positive emotions associated with healthy stress management.   Intervention: Stress Management  Activity :  Meditation.  LRT introduced the concept of meditation to patients.  Patients were to listen and follow along as the meditation played to fully engage in the activity.  Education:  Stress Management, Discharge Planning.   Education Outcome: Acknowledges edcuation/In group clarification offered/Needs additional education  Clinical Observations/Feedback: Pt did not attend group.   [name], LRT/CTRS         [name] A 11/07/2016 12:08 PM 
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
## 2016-11-07 NOTE — Progress Notes (Signed)
Admission Note    Pt is a 21 y/o female admitted to the adult I/P unit. Pt who had been admitted several times as an adolescent at [facility] calmed to have been off her medications for about a year. Pt at the time of admission endorsed moderate anxiety and depression; Pt denied SI at this time. Pt however, admitted to feeling suicidal earlier in the day; states, "I feel much safer in here than I felt outside; I know I feel this way because I stopped taking my meds for about a year." Pt stated goal were "get my med straight" and "need help with my coping skills." Pt is an occasional drinker however, smokes about 2 packs a day. Support, encouragement, and safe environment provided.  15-minute safety checks initiated and continued. Pt remained calm and cooperative through the admission process. Pt searched and no contrabands found.

## 2016-11-07 NOTE — Progress Notes (Signed)
D:Pt has been tearful after talking with her boyfriend's mom on the phone. She talked about missing her four year old daughter that is in [place] and being upset that her boyfriend's mom for trying to adopt the child.  A:Supported pt to discuss feelings. Offered encouragement and 15 minute checks. Completed EKG as ordered. R:Safety maintained on the unit.

## 2016-11-07 NOTE — Progress Notes (Signed)
NUTRITION ASSESSMENT  Pt identified as at risk on the Malnutrition Screen Tool  INTERVENTION: 1. Educated patient on the importance of nutrition and encouraged intake of food and beverages. 2. Discussed weight goals. 3. Supplements: will order Boost Breeze BID, each supplement provides 250 kcal and 9 grams of protein   NUTRITION DIAGNOSIS: Unintentional weight loss related to sub-optimal intake as evidenced by pt report.   Goal: Pt to meet >/= 90% of their estimated nutrition needs.  Monitor:  PO intake  Assessment:  Pt admitted anxiety, depression, and SI. Pt reports being off of her medications x1 year. She smokes 2 packs/day. Per review of Care Everywhere, she weighed 168 lbs on 02/01/16 at [facility]. Compared to current weight, this indicates 43 lb weight loss (25.6% body weight) in the past 9 months; this is significant for time frame.  Will order supplements as outlined above. Continue to encourage PO intakes of meals, supplements, and snacks.    21 y.o. female  Height: Ht Readings from Last 1 Encounters:  11/06/16  (1.651 m)    Weight: Wt Readings from Last 1 Encounters:  11/06/16 125 lb (56.7 kg)    Weight Hx: Wt Readings from Last 10 Encounters:  11/06/16 125 lb (56.7 kg)  08/17/16 130 lb (59 kg)  04/05/15 205 lb 2 oz (93 kg)  10/10/11 191 lb 11.2 oz (87 kg) (97 %, Z= 1.85)*  07/14/11 185 lb 8 oz (84.1 kg) (96 %, Z= 1.77)*  06/04/11 186 lb 12.8 oz (84.7 kg) (96 %, Z= 1.79)*  05/08/11 187 lb 6.3 oz (85 kg) (96 %, Z= 1.80)*  08/08/08 139 lb 9.6 oz (63.3 kg) (82 %, Z= 0.93)*  04/13/11 188 lb (85.3 kg) (97 %, Z= 1.82)*  01/01/11 189 lb 9.6 oz (86 kg) (97 %, Z= 1.85)*   * Growth percentiles are based on CDC 2-20 Years data.    BMI:  Body mass index is 20.8 kg/m. Pt meets criteria for normal weight based on current BMI.  Estimated Nutritional Needs: Kcal: 25-30 kcal/kg Protein: > 1 gram protein/kg Fluid: 1 ml/kcal  Diet Order: Diet regular Room  service appropriate? Yes; Fluid consistency: Thin Pt is also offered choice of unit snacks mid-morning and mid-afternoon.  Pt is eating as desired.   Lab results and medications reviewed.      Trenton Gammon, MS, RD, LDN, [facility] Inpatient Clinical Dietitian Pager # [phone] After hours/weekend pager # [phone]

## 2016-11-08 DIAGNOSIS — F314 Bipolar disorder, current episode depressed, severe, without psychotic features: Principal | ICD-10-CM

## 2016-11-08 DIAGNOSIS — F39 Unspecified mood [affective] disorder: Secondary | ICD-10-CM

## 2016-11-08 LAB — RAPID URINE DRUG SCREEN, HOSP PERFORMED
Amphetamines: NOT DETECTED
Barbiturates: NOT DETECTED
Benzodiazepines: NOT DETECTED
Cocaine: NOT DETECTED
OPIATES: NOT DETECTED
Tetrahydrocannabinol: POSITIVE — AB

## 2016-11-08 LAB — PREGNANCY, URINE: PREG TEST UR: NEGATIVE

## 2016-11-08 MED ORDER — PNEUMOCOCCAL VAC POLYVALENT 25 MCG/0.5ML IJ INJ
0.5000 mL | INJECTION | INTRAMUSCULAR | Status: AC
  Administered 2016-11-09: 0.5 mL via INTRAMUSCULAR

## 2016-11-08 MED ORDER — INFLUENZA VAC SPLIT QUAD 0.5 ML IM SUSY
0.5000 mL | PREFILLED_SYRINGE | INTRAMUSCULAR | Status: AC
  Administered 2016-11-09: 0.5 mL via INTRAMUSCULAR
  Filled 2016-11-08: qty 0.5

## 2016-11-08 NOTE — Progress Notes (Signed)
B[facility] Group Notes:  (Nursing/MHT/Case Management/Adjunct)  Date:  11/08/2016  Time:  1115 Type of Therapy:  Nurse Education  Participation Level:  Active  Participation Quality:  Appropriate and Drowsy  Affect:  Flat  Cognitive:  Oriented  Insight:  Appropriate  Engagement in Group:  Developing/Improving  Modes of Intervention:  Activity, Discussion, Education, Socialization and Support  Summary of Progress/Problems:The purpose of this group is to educate and introduce patients to the benefits of aromatherapy. Pt was drowsy but participated and answered questions appropriately.   [name] 11/08/2016, 2:09 PM

## 2016-11-08 NOTE — BHH Group Notes (Signed)
B[facility] Group Notes: (Clinical Social Work)   11/08/2016      Type of Therapy:  Group Therapy   Participation Level:  Did Not Attend despite MHT prompting   Ambrose Mantle, LCSW 11/08/2016, 12:41 PM

## 2016-11-08 NOTE — Progress Notes (Signed)
[facility] MD Progress Note  11/08/2016 11:00 AM [name]  MRN:  [id number]   Subjective:  Patient reports that she slept very well and feels okay this morning. She reports being very sleepy and denies any SI/HI/AVH and contracts for safety.  Objective: Patient's chart and findings reviewed with treatment team. Patient is cooperative and very sleepy today due to starting Geodon. She does answer question of denying SI/HI/AVH and depression currently.   Principal Problem: Bipolar 1 disorder, depressed, severe ([place]) Diagnosis:   Patient Active Problem List   Diagnosis Date Noted  . Bipolar affective disorder, currently depressed, moderate ([place]) [F31.32]   . Bipolar 1 disorder, depressed, severe ([place]) [F31.4] 11/06/2016  . Allergic conjunctivitis [H10.10] 10/13/2011  . Well adolescent visit [Z00.129] 07/14/2011  . PTSD (post-traumatic stress disorder) [F43.10] 07/14/2011  . Dysmenorrhea in the adolescent [N94.6] 07/14/2011  . Allergic rhinitis, mild [J30.9] 07/14/2011  . Anxiety [F41.9] 06/04/2011  . Depression [F32.9] 06/04/2011  . BMI (body mass index), pediatric, 95-99% for age [facility] 06/04/2011   Total Time spent with patient: 15 minutes  Past Psychiatric History: See H&P  Past Medical History:  Past Medical History:  Diagnosis Date  . Allergic conjunctivitis   . Allergy   . Anxiety 06/04/2011   panic attacks  . Astigmatism    glasses  . Depression   . Obesity   . Post traumatic stress disorder   . Urinary tract infection 09/20/1993   recurrent, nl [facility], [facility])    Past Surgical History:  Procedure Laterality Date  . INGUINAL HERNIA REPAIR  7/95   bilat exploration, left repair  . UMBILICAL HERNIA REPAIR  7/95   repaired as infant   Family History:  Family History  Problem Relation Age of Onset  . Hypertension Mother   . Hypothyroidism Mother   . Hypertension Father   . Brain cancer Maternal Grandfather   . Cancer Other    Family Psychiatric  History: See  H&P Social History:  History  Alcohol Use No     History  Drug Use No    Social History   Social History  . Marital status: Single    Spouse name: N/A  . Number of children: N/A  . Years of education: N/A   Social History Main Topics  . Smoking status: Current Every Day Smoker    Packs/day: 1.00    Types: Cigarettes  . Smokeless tobacco: Never Used  . Alcohol use No  . Drug use: No  . Sexual activity: Yes    Birth control/ protection: Pill, Condom   Other Topics Concern  . None   Social History Narrative   Graduated HS   21 years old   Living with grandmother   Moving to [place]         Additional Social History:    Pain Medications: None Prescriptions: Has not been on meds for a year. Over the Counter: None History of alcohol / drug use?: Yes Negative Consequences of Use: Legal, Financial, Work / School Name of Substance 1: Marijuana 1 - Age of First Use: 21 years of age 40 - Amount (size/oz): "A few bowls a day." 1 - Frequency: Daily use  1 - Duration: On-going for last couple of years. 1 - Last Use / Amount: 11/06/16                  Sleep: Good  Appetite:  Good  Current Medications: Current Facility-Administered Medications  Medication Dose Route Frequency Provider Last  Rate Last Dose  . acetaminophen (TYLENOL) tablet 650 mg  650 mg Oral Q6H PRN Lindon Romp A, NP      . alum & mag hydroxide-simeth (MAALOX/MYLANTA) 200-200-20 MG/5ML suspension 30 mL  30 mL Oral Q4H PRN Lindon Romp A, NP      . feeding supplement (BOOST / RESOURCE BREEZE) liquid 1 Container  1 Container Oral BID BM Cobos, Myer [place], MD   1 Container at 11/07/16 1549  . hydrOXYzine (ATARAX/VISTARIL) tablet 25 mg  25 mg Oral TID PRN Lindon Romp A, NP   25 mg at 11/07/16 1549  . [START ON 11/09/2016] Influenza vac split quadrivalent PF (FLUARIX) injection 0.5 mL  0.5 mL Intramuscular Tomorrow-1000 Cobos, Fernando A, MD      . LORazepam (ATIVAN) tablet 0.5 mg  0.5 mg Oral Q6H  PRN Cobos, Fernando A, MD      . magnesium hydroxide (MILK OF MAGNESIA) suspension 30 mL  30 mL Oral Daily PRN Lindon Romp A, NP      . nicotine polacrilex (NICORETTE) gum 2 mg  2 mg Oral PRN Cobos, Myer [place], MD   2 mg at 11/07/16 1255  . [START ON 11/09/2016] pneumococcal 23 valent vaccine (PNU-IMMUNE) injection 0.5 mL  0.5 mL Intramuscular Tomorrow-1000 Cobos, Fernando A, MD      . traZODone (DESYREL) tablet 50 mg  50 mg Oral QHS PRN Lindon Romp A, NP   50 mg at 11/07/16 2253  . venlafaxine XR (EFFEXOR-XR) 24 hr capsule 37.5 mg  37.5 mg Oral Q breakfast Cobos, Myer [place], MD   37.5 mg at 11/08/16 0808  . ziprasidone (GEODON) capsule 20 mg  20 mg Oral BID WC Cobos, Myer [place], MD   20 mg at 11/08/16 0808    Lab Results:  Results for orders placed or performed during the hospital encounter of 11/06/16 (from the past 48 hour(s))  Pregnancy, urine     Status: None   Collection Time: 11/06/16  9:55 PM  Result Value Ref Range   Preg Test, Ur NEGATIVE NEGATIVE    Comment:        THE SENSITIVITY OF THIS METHODOLOGY IS >20 mIU/mL. Performed at [facility], [place] [address], [place], [place] [address]   CBC     Status: None   Collection Time: 11/07/16  6:41 AM  Result Value Ref Range   WBC 7.7 4.0 - 10.5 K/uL   RBC 4.51 3.87 - 5.11 MIL/uL   Hemoglobin 14.4 12.0 - 15.0 g/dL   HCT 42.7 36.0 - 46.0 %   MCV 94.7 78.0 - 100.0 fL   MCH 31.9 26.0 - 34.0 pg   MCHC 33.7 30.0 - 36.0 g/dL   RDW 13.0 11.5 - 15.5 %   Platelets 260 150 - 400 K/uL    Comment: Performed at [facility], [place] [address] [place], [place] [address]  Comprehensive metabolic panel     Status: Abnormal   Collection Time: 11/07/16  6:41 AM  Result Value Ref Range   Sodium 140 135 - 145 mmol/L   Potassium 3.9 3.5 - 5.1 mmol/L   Chloride 101 101 - 111 mmol/L   CO2 28 22 - 32 mmol/L   Glucose, Bld 108 (H) 65 - 99 mg/dL   BUN 21 (H) 6 - 20 mg/dL   Creatinine, Ser 0.82 0.44 - 1.00 mg/dL    Calcium 9.6 8.9 - 10.3 mg/dL   Total Protein 7.3 6.5 - 8.1 g/dL   Albumin 4.6  3.5 - 5.0 g/dL   AST 13 (L) 15 - 41 U/L   ALT 10 (L) 14 - 54 U/L   Alkaline Phosphatase 57 38 - 126 U/L   Total Bilirubin 1.8 (H) 0.3 - 1.2 mg/dL   GFR calc non Af Amer >60 >60 mL/min   GFR calc Af Amer >60 >60 mL/min    Comment: (NOTE) The eGFR has been calculated using the CKD EPI equation. This calculation has not been validated in all clinical situations. eGFR's persistently <60 mL/min signify possible Chronic Kidney Disease.    Anion gap 11 5 - 15    Comment: Performed at [facility], [place] [address] [place], [place] [address]  Hemoglobin A1c     Status: None   Collection Time: 11/07/16  6:41 AM  Result Value Ref Range   Hgb A1c MFr Bld 4.9 4.8 - 5.6 %    Comment: (NOTE) Pre diabetes:          5.7%-6.4% Diabetes:              >6.4% Glycemic control for   <7.0% adults with diabetes    Mean Plasma Glucose 93.93 mg/dL    Comment: Performed at [place] [address] [place], [place] [address]  Lipid panel     Status: None   Collection Time: 11/07/16  6:41 AM  Result Value Ref Range   Cholesterol 168 0 - 200 mg/dL   Triglycerides 92 <150 mg/dL   HDL 54 >40 mg/dL   Total CHOL/HDL Ratio 3.1 RATIO   VLDL 18 0 - 40 mg/dL   LDL Cholesterol 96 0 - 99 mg/dL    Comment:        Total Cholesterol/HDL:CHD Risk Coronary Heart Disease Risk Table                     Men   Women  1/2 Average Risk   3.4   3.3  Average Risk       5.0   4.4  2 X Average Risk   9.6   7.1  3 X Average Risk  23.4   11.0        Use the calculated Patient Ratio above and the CHD Risk Table to determine the patient's CHD Risk.        ATP III CLASSIFICATION (LDL):  <100     mg/dL   Optimal  100-129  mg/dL   Near or Above                    Optimal  130-159  mg/dL   Borderline  160-189  mg/dL   High  >190     mg/dL   Very High Performed at [place] [address]  [place], [place] [address]   TSH     Status: None   Collection Time: 11/07/16  6:41 AM  Result Value Ref Range   TSH 1.554 0.350 - 4.500 uIU/mL    Comment: Performed by a 3rd Generation assay with a functional sensitivity of <=0.01 uIU/mL. Performed at [facility], [place] [address] [place],  [address]     Blood Alcohol level:  Lab Results  Component Value Date   [facility] <11 05/08/2011   ETH <11 62/83/1517    Metabolic Disorder Labs: Lab Results  Component Value Date   HGBA1C 4.9 11/07/2016   MPG 93.93 11/07/2016   MPG 108 07/10/2007   No results  found for: PROLACTIN Lab Results  Component Value Date   CHOL 168 11/07/2016   TRIG 92 11/07/2016   HDL 54 11/07/2016   CHOLHDL 3.1 11/07/2016   VLDL 18 11/07/2016   LDLCALC 96 11/07/2016   LDLCALC  07/10/2007    109        Total Cholesterol/HDL:CHD Risk Coronary Heart Disease Risk Table                     Men   Women  1/2 Average Risk   3.4   3.3    Physical Findings: AIMS:  , ,  ,  ,    CIWA:    COWS:     Musculoskeletal: Strength & Muscle Tone: within normal limits Gait & Station: normal Patient leans: N/A  Psychiatric Specialty Exam: Physical Exam  Nursing note and vitals reviewed. Constitutional: She is oriented to person, place, and time. She appears well-developed and well-nourished.  Cardiovascular: Normal rate.   Respiratory: Effort normal.  Musculoskeletal: Normal range of motion.  Neurological: She is alert and oriented to person, place, and time.  Skin: Skin is warm.    Review of Systems  Constitutional: Negative.   HENT: Negative.   Eyes: Negative.   Respiratory: Negative.   Cardiovascular: Negative.   Gastrointestinal: Negative.   Genitourinary: Negative.   Musculoskeletal: Negative.   Skin: Negative.   Neurological: Negative.   Endo/Heme/Allergies: Negative.   Psychiatric/Behavioral: Negative for hallucinations and suicidal ideas. The patient does not have insomnia.      Blood pressure 92/63, pulse 95, temperature 98.7 F (37.1 C), temperature source Oral, resp. rate 16, height '5\' 5"'  (1.651 m), weight 56.7 kg (125 lb), last menstrual period 11/06/2016, SpO2 100 %.Body mass index is 20.8 kg/m.  General Appearance: Casual  Eye Contact:  Minimal patient is sleepy  Speech:  Clear and Coherent  Volume:  Decreased  Mood:  Depressed improving  Affect:  Flat  Thought Process:  Coherent and Descriptions of Associations: Intact  Orientation:  Full (Time, Place, and Person)  Thought Content:  WDL  Suicidal Thoughts:  No  Homicidal Thoughts:  No  Memory:  Immediate;   Good Recent;   Good Remote;   Good  Judgement:  Good  Insight:  Good  Psychomotor Activity:  Normal  Concentration:  Concentration: Good and Attention Span: Good  Recall:  Good  Fund of Knowledge:  Good  Language:  Good  Akathisia:  No  Handed:  Right  AIMS (if indicated):     Assets:  Communication Skills Desire for Improvement Financial Resources/Insurance Housing Physical Health Social Support Transportation  ADL's:  Intact  Cognition:  WNL  Sleep:  Number of Hours: 5.25     Treatment Plan Summary: Daily contact with patient to assess and evaluate symptoms and progress in treatment, Medication management and Plan is to:  -Continue Effexor-XR 37.5 mg PO Daily for mood stability -Continue Geodon 20 mg PO BID for mood stability -Continue 50 mg PO QHS PRN for insomnia -Continue Vistaril 25 mg PO TID PRN for anxiety -Continue Ativan 0.5 mg PO Q6H PRN for anxiety -Encourage group therapy participation  Lewis Shock, FNP 11/08/2016, 11:00 AM

## 2016-11-08 NOTE — Progress Notes (Signed)
D:Pt has been in her room resting much of the day. Pt did come to writer's group this morning. She was sleepy and came to group late. Pt participated appropriately. Pt reports feeling sleepy with starting a new medication yesterday. A:Offered support, encouragement and 15 minute checks. R:Pt denies si and hi. Safety maintained on the unit.

## 2016-11-08 NOTE — Plan of Care (Signed)
Problem: Medication: Goal: Compliance with prescribed medication regimen will improve Outcome: Progressing Pt is taking medications as prescribed.   

## 2016-11-08 NOTE — Progress Notes (Signed)
[name] had been up and visible in milieu, attended and participated in evening group activity. She did endorse anxiety this evening as well as depression. She was seen interacting appropriately with peers in milieu, did not verbalize any complaints of pain and was able to receive bedtime medication without incident. A. Support and encouragement provided. R. Safety maintained, will continue to monitor.

## 2016-11-09 MED ORDER — NICOTINE 21 MG/24HR TD PT24
21.0000 mg | MEDICATED_PATCH | Freq: Every day | TRANSDERMAL | Status: DC
  Administered 2016-11-10 – 2016-11-11 (×2): 21 mg via TRANSDERMAL
  Filled 2016-11-09 (×6): qty 1

## 2016-11-09 NOTE — [facility] Group Notes (Signed)
[facility] LCSW Group Therapy Note  Date/Time:    11/09/2016   10:00 - 11:00 AM  Type of Therapy and Topic:  Group Therapy:  Healthy Self Image and Positive Change  Participation Level:  Active   Description of Group:  In this group, patients compared and contrasted their current "I am...." statements to the visions they identified as desirable for their lives.  Patients discussed their tendency toward cognitive distortions, and how they can go about making positive changes in their cognitions that will positively impact their behaviors.  Many expressions of similarities and mutual support were provided among group members.  Facilitator played a motivational 3-minute speech and a discussion was held regarding reactions.  Patients were left with the task of thinking about what "I am...." statements they can start using in their lives immediately.  Therapeutic Goals: 1. Patient will state their current self-perception as expressed in an "I Am" statement 2. Patient will contrast this with their desired vision for their lives 3. Patient will discuss cognitive distortions and how these affect their ongoing "I Am" thoughts 4. Patient will verbalize statements that challenge their cognitive distortions  Summary of Patient Progress:  The patient expressed initially "I am confused", then by the end of group was able to state "I am learning" and explain why she was able to make this change.  She expressed her misgiving honestly and appreciated the group's feedback which helped her to look at things differently.   Therapeutic Modalities Cognitive Behavioral Therapy Motivational Interviewing  Ambrose Mantle, LCSW 11/09/2016 8:25 AM

## 2016-11-09 NOTE — Progress Notes (Signed)
D. Pt has been calm and cooperative throughout shift.  Pt currently denies SI/HI and AVH and agrees to contact staff before acting on any harmful thoughts.  A. Labs and vitals monitored.Pt compliant with meds. Pt supported emotionally and encouraged to express concerns and ask questions.   R. Pt remains safe with 15 minute checks. Will continue POC.

## 2016-11-09 NOTE — Progress Notes (Signed)
[facility] MD Progress Note  11/09/2016 1:42 PM [name]  MRN:  [id number]   Subjective:  Patient reports that she is doing ok today. She did not sleep well last, but admit that she was asleep most of the day yesterday and felt tired at bedtime and did not take any Trazodone last night. She reports depression at 4/10 and Anxiety 0/10. She denies any SI/HI/AVH and very happy with the medication. She contracts for safety on the unit  Objective: Patient's chart and findings reviewed and discussed with treatment team. Patient was very pleasant and alert today. She agrees to take the Trazodone tonight and will continue the current medications as well.   Principal Problem: Bipolar 1 disorder, depressed, severe ([facility]) Diagnosis:   Patient Active Problem List   Diagnosis Date Noted  . Bipolar affective disorder, currently depressed, moderate ([facility]) [F31.32]   . Bipolar 1 disorder, depressed, severe ([facility]) [F31.4] 11/06/2016  . Allergic conjunctivitis [H10.10] 10/13/2011  . Well adolescent visit [Z00.129] 07/14/2011  . PTSD (post-traumatic stress disorder) [F43.10] 07/14/2011  . Dysmenorrhea in the adolescent [N94.6] 07/14/2011  . Allergic rhinitis, mild [J30.9] 07/14/2011  . Anxiety [F41.9] 06/04/2011  . Depression [F32.9] 06/04/2011  . BMI (body mass index), pediatric, 95-99% for age [facility] 06/04/2011   Total Time spent with patient: 25 minutes  Past Psychiatric History: See H&P  Past Medical History:  Past Medical History:  Diagnosis Date  . Allergic conjunctivitis   . Allergy   . Anxiety 06/04/2011   panic attacks  . Astigmatism    glasses  . Depression   . Obesity   . Post traumatic stress disorder   . Urinary tract infection 02-14-1993   recurrent, nl [facility], [facility])    Past Surgical History:  Procedure Laterality Date  . INGUINAL HERNIA REPAIR  7/95   bilat exploration, left repair  . UMBILICAL HERNIA REPAIR  7/95   repaired as infant   Family History:  Family History   Problem Relation Age of Onset  . Hypertension Mother   . Hypothyroidism Mother   . Hypertension Father   . Brain cancer Maternal Grandfather   . Cancer Other    Family Psychiatric  History: See H&P Social History:  History  Alcohol Use No     History  Drug Use No    Social History   Social History  . Marital status: Single    Spouse name: N/A  . Number of children: N/A  . Years of education: N/A   Social History Main Topics  . Smoking status: Current Every Day Smoker    Packs/day: 1.00    Types: Cigarettes  . Smokeless tobacco: Never Used  . Alcohol use No  . Drug use: No  . Sexual activity: Yes    Birth control/ protection: Pill, Condom   Other Topics Concern  . None   Social History Narrative   Graduated HS   21 years old   Living with grandmother   Moving to [place]         Additional Social History:    Pain Medications: None Prescriptions: Has not been on meds for a year. Over the Counter: None History of alcohol / drug use?: Yes Negative Consequences of Use: Legal, Financial, Work / School Name of Substance 1: Marijuana 1 - Age of First Use: 21 years of age 71 - Amount (size/oz): "A few bowls a day." 1 - Frequency: Daily use  1 - Duration: On-going for last couple of years. 1 -  Last Use / Amount: 11/06/16                  Sleep: Fair  Appetite:  Good  Current Medications: Current Facility-Administered Medications  Medication Dose Route Frequency Provider Last Rate Last Dose  . acetaminophen (TYLENOL) tablet 650 mg  650 mg Oral Q6H PRN Nira Conn A, NP      . alum & mag hydroxide-simeth (MAALOX/MYLANTA) 200-200-20 MG/5ML suspension 30 mL  30 mL Oral Q4H PRN Nira Conn A, NP      . feeding supplement (BOOST / RESOURCE BREEZE) liquid 1 Container  1 Container Oral BID BM Cobos, Rockey Situ, MD   Stopped at 11/09/16 1151  . hydrOXYzine (ATARAX/VISTARIL) tablet 25 mg  25 mg Oral TID PRN Nira Conn A, NP   25 mg at 11/07/16 1549  .  Influenza vac split quadrivalent PF (FLUARIX) injection 0.5 mL  0.5 mL Intramuscular Tomorrow-1000 Cobos, Fernando A, MD      . LORazepam (ATIVAN) tablet 0.5 mg  0.5 mg Oral Q6H PRN Cobos, Rockey Situ, MD   0.5 mg at 11/09/16 0615  . magnesium hydroxide (MILK OF MAGNESIA) suspension 30 mL  30 mL Oral Daily PRN Nira Conn A, NP      . nicotine (NICODERM CQ - dosed in mg/24 hours) patch 21 mg  21 mg Transdermal Daily Money, Feliz Beam B, FNP      . pneumococcal 23 valent vaccine (PNU-IMMUNE) injection 0.5 mL  0.5 mL Intramuscular Tomorrow-1000 Cobos, Fernando A, MD      . traZODone (DESYREL) tablet 50 mg  50 mg Oral QHS PRN Nira Conn A, NP   50 mg at 11/07/16 2253  . venlafaxine XR (EFFEXOR-XR) 24 hr capsule 37.5 mg  37.5 mg Oral Q breakfast Cobos, Rockey Situ, MD   37.5 mg at 11/09/16 0828  . ziprasidone (GEODON) capsule 20 mg  20 mg Oral BID WC Cobos, Rockey Situ, MD   20 mg at 11/09/16 1610    Lab Results: No results found for this or any previous visit (from the past 48 hour(s)).  Blood Alcohol level:  Lab Results  Component Value Date   [facility] <11 05/08/2011   ETH <11 08/31/2010    Metabolic Disorder Labs: Lab Results  Component Value Date   HGBA1C 4.9 11/07/2016   MPG 93.93 11/07/2016   MPG 108 07/10/2007   No results found for: PROLACTIN Lab Results  Component Value Date   CHOL 168 11/07/2016   TRIG 92 11/07/2016   HDL 54 11/07/2016   CHOLHDL 3.1 11/07/2016   VLDL 18 11/07/2016   LDLCALC 96 11/07/2016   LDLCALC  07/10/2007    109        Total Cholesterol/HDL:CHD Risk Coronary Heart Disease Risk Table                     Men   Women  1/2 Average Risk   3.4   3.3    Physical Findings: AIMS:  , ,  ,  ,    CIWA:    COWS:     Musculoskeletal: Strength & Muscle Tone: within normal limits Gait & Station: normal Patient leans: N/A  Psychiatric Specialty Exam: Physical Exam  Nursing note and vitals reviewed. Constitutional: She is oriented to person, place, and time.  She appears well-developed and well-nourished.  Cardiovascular: Normal rate.   Respiratory: Effort normal.  Musculoskeletal: Normal range of motion.  Neurological: She is alert and oriented to person, place, and  time.  Skin: Skin is warm.  Psychiatric: She has a normal mood and affect. Her behavior is normal. Judgment and thought content normal.    Review of Systems  Constitutional: Negative.   HENT: Negative.   Eyes: Negative.   Respiratory: Negative.   Cardiovascular: Negative.   Gastrointestinal: Negative.   Genitourinary: Negative.   Musculoskeletal: Negative.   Skin: Negative.   Neurological: Negative.   Endo/Heme/Allergies: Negative.   Psychiatric/Behavioral: Negative for depression, hallucinations and suicidal ideas. The patient has insomnia. The patient is not nervous/anxious.     Blood pressure 114/73, pulse 63, temperature 98.4 F (36.9 C), temperature source Oral, resp. rate 16, height  (1.651 m), weight 56.7 kg (125 lb), last menstrual period 11/06/2016, SpO2 100 %.Body mass index is 20.8 kg/m.  General Appearance: Casual  Eye Contact:  Good  Speech:  Clear and Coherent and Normal Rate  Volume:  Normal  Mood:  Euthymic  Affect:  Appropriate  Thought Process:  Coherent and Descriptions of Associations: Intact  Orientation:  Full (Time, Place, and Person)  Thought Content:  WDL  Suicidal Thoughts:  No  Homicidal Thoughts:  No  Memory:  Immediate;   Good Recent;   Good Remote;   Good  Judgement:  Good  Insight:  Good  Psychomotor Activity:  Normal  Concentration:  Concentration: Good and Attention Span: Good  Recall:  Good  Fund of Knowledge:  Good  Language:  Good  Akathisia:  No  Handed:  Right  AIMS (if indicated):     Assets:  Communication Skills Desire for Improvement Financial Resources/Insurance Housing Physical Health Social Support Transportation  ADL's:  Intact  Cognition:  WNL  Sleep:  Number of Hours: 6.5     Treatment Plan  Summary: Daily contact with patient to assess and evaluate symptoms and progress in treatment, Medication management and Plan is to:  -Continue Geodon 20 mg PO BID with meals for mood stability -Continue Effexor-XR 37.5 mg PO Daily for mood stability -Continue Vistaril 25 mg PO TID PRN for anxiety -Continue Ativan 0.5 mg PO Q6H PRN for anxiety -Continue Trazodone 50 mg PO QHS PRN for insomnia -Encourage group therapy participation   Maryfrances Bunnell, FNP 11/09/2016, 1:42 PM

## 2016-11-09 NOTE — Progress Notes (Signed)
Adult Psychoeducational Group Note  Date:  11/09/2016 Time:  2:20 AM  Group Topic/Focus:  Wrap-Up Group:   The focus of this group is to help patients review their daily goal of treatment and discuss progress on daily workbooks.  Participation Level:  Active  Participation Quality:  Appropriate  Affect:  Appropriate  Cognitive:  Appropriate  Insight: Appropriate  Engagement in Group:  Engaged  Modes of Intervention:  Discussion  Additional Comments: Pt stated her goal for today was to talk with her doctor about her medication. Pt stated she accomplished her goal today. Pt rated her overall day a 5 out of 10. Pt stated he attended all groups held today.  Felipa Furnace 11/09/2016, 2:20 AM

## 2016-11-09 NOTE — Progress Notes (Signed)
[name] had been up and visible in milieu this evening, attended and participated in group activity. She has appeared drowsy today and spoke about feeling that way due to the medications that she received. She was seen interacting appropriately with peers in milieu, and retired to her room early this evening. She did not request nor receive any medications this evening and did not verbalize any complaints of pain. A. Support and encouragement provided. R. Safety maintained, will continue to monitor.

## 2016-11-10 NOTE — Progress Notes (Signed)
[facility] MD Progress Note  11/10/2016 3:00 PM [name]  MRN:  [id number]   Subjective:  Patient reports that she slept really good last night by using the Trazodone, Geodon, and Vistaril. She is happy with that combination. She reports 0/10 depression and 0/10 anxiety rating. She reports that things in her head are quiet and "as in not racing and I can think about one thing." She denies any SI/HI/AVH and contracts for safety.  Objective: Patient's chart and findings reviewed and discussed with treatment team. Patient has been attending group and participating appropriately. She has been interacting with staff and other patients a[ppropriately. She has a pleasant mood and affect upon approach. Discussed discharge planing for tomorrow possibly.  Principal Problem: Bipolar 1 disorder, depressed, severe (HCC) Diagnosis:   Patient Active Problem List   Diagnosis Date Noted  . Bipolar affective disorder, currently depressed, moderate (HCC) [F31.32]   . Bipolar 1 disorder, depressed, severe (HCC) [F31.4] 11/06/2016  . Allergic conjunctivitis [H10.10] 10/13/2011  . Well adolescent visit [Z00.129] 07/14/2011  . PTSD (post-traumatic stress disorder) [F43.10] 07/14/2011  . Dysmenorrhea in the adolescent [N94.6] 07/14/2011  . Allergic rhinitis, mild [J30.9] 07/14/2011  . Anxiety [F41.9] 06/04/2011  . Depression [F32.9] 06/04/2011  . BMI (body mass index), pediatric, 95-99% for age [facility] 06/04/2011   Total Time spent with patient: 25 minutes  Past Psychiatric History: See H&P  Past Medical History:  Past Medical History:  Diagnosis Date  . Allergic conjunctivitis   . Allergy   . Anxiety 06/04/2011   panic attacks  . Astigmatism    glasses  . Depression   . Obesity   . Post traumatic stress disorder   . Urinary tract infection 07-23-1993   recurrent, nl [facility], [facility])    Past Surgical History:  Procedure Laterality Date  . INGUINAL HERNIA REPAIR  7/95   bilat exploration, left repair   . UMBILICAL HERNIA REPAIR  7/95   repaired as infant   Family History:  Family History  Problem Relation Age of Onset  . Hypertension Mother   . Hypothyroidism Mother   . Hypertension Father   . Brain cancer Maternal Grandfather   . Cancer Other    Family Psychiatric  History: See H&P Social History:  History  Alcohol Use No     History  Drug Use No    Social History   Social History  . Marital status: Single    Spouse name: N/A  . Number of children: N/A  . Years of education: N/A   Social History Main Topics  . Smoking status: Current Every Day Smoker    Packs/day: 1.00    Types: Cigarettes  . Smokeless tobacco: Never Used  . Alcohol use No  . Drug use: No  . Sexual activity: Yes    Birth control/ protection: Pill, Condom   Other Topics Concern  . None   Social History Narrative   Graduated HS   21 years old   Living with grandmother   Moving to [place]         Additional Social History:    Pain Medications: None Prescriptions: Has not been on meds for a year. Over the Counter: None History of alcohol / drug use?: Yes Negative Consequences of Use: Legal, Financial, Work / School Name of Substance 1: Marijuana 1 - Age of First Use: 21 years of age 75 - Amount (size/oz): "A few bowls a day." 1 - Frequency: Daily use  1 - Duration: On-going for  last couple of years. 1 - Last Use / Amount: 11/06/16                  Sleep: Good  Appetite:  Good  Current Medications: Current Facility-Administered Medications  Medication Dose Route Frequency Provider Last Rate Last Dose  . acetaminophen (TYLENOL) tablet 650 mg  650 mg Oral Q6H PRN Nira Conn A, NP   650 mg at 11/09/16 1851  . alum & mag hydroxide-simeth (MAALOX/MYLANTA) 200-200-20 MG/5ML suspension 30 mL  30 mL Oral Q4H PRN Nira Conn A, NP      . feeding supplement (BOOST / RESOURCE BREEZE) liquid 1 Container  1 Container Oral BID BM Cobos, Rockey Situ, MD   1 Container at 11/09/16  1532  . hydrOXYzine (ATARAX/VISTARIL) tablet 25 mg  25 mg Oral TID PRN Jackelyn Poling, NP   25 mg at 11/09/16 2215  . LORazepam (ATIVAN) tablet 0.5 mg  0.5 mg Oral Q6H PRN Cobos, Rockey Situ, MD   0.5 mg at 11/09/16 0615  . magnesium hydroxide (MILK OF MAGNESIA) suspension 30 mL  30 mL Oral Daily PRN Nira Conn A, NP   30 mL at 11/10/16 1206  . nicotine (NICODERM CQ - dosed in mg/24 hours) patch 21 mg  21 mg Transdermal Daily Money, Gerlene Burdock, FNP   21 mg at 11/10/16 0759  . traZODone (DESYREL) tablet 50 mg  50 mg Oral QHS PRN Nira Conn A, NP   50 mg at 11/09/16 2215  . venlafaxine XR (EFFEXOR-XR) 24 hr capsule 37.5 mg  37.5 mg Oral Q breakfast Cobos, Rockey Situ, MD   37.5 mg at 11/10/16 0759  . ziprasidone (GEODON) capsule 20 mg  20 mg Oral BID WC Cobos, Rockey Situ, MD   20 mg at 11/10/16 0759    Lab Results: No results found for this or any previous visit (from the past 48 hour(s)).  Blood Alcohol level:  Lab Results  Component Value Date   [facility] <11 05/08/2011   ETH <11 08/31/2010    Metabolic Disorder Labs: Lab Results  Component Value Date   HGBA1C 4.9 11/07/2016   MPG 93.93 11/07/2016   MPG 108 07/10/2007   No results found for: PROLACTIN Lab Results  Component Value Date   CHOL 168 11/07/2016   TRIG 92 11/07/2016   HDL 54 11/07/2016   CHOLHDL 3.1 11/07/2016   VLDL 18 11/07/2016   LDLCALC 96 11/07/2016   LDLCALC  07/10/2007    109        Total Cholesterol/HDL:CHD Risk Coronary Heart Disease Risk Table                     Men   Women  1/2 Average Risk   3.4   3.3    Physical Findings: AIMS:  , ,  ,  ,    CIWA:    COWS:     Musculoskeletal: Strength & Muscle Tone: within normal limits Gait & Station: normal Patient leans: N/A  Psychiatric Specialty Exam: Physical Exam  Nursing note and vitals reviewed. Constitutional: She is oriented to person, place, and time. She appears well-developed and well-nourished.  Cardiovascular: Normal rate.   Respiratory:  Effort normal.  Musculoskeletal: Normal range of motion.  Neurological: She is alert and oriented to person, place, and time.  Skin: Skin is warm.  Psychiatric: She has a normal mood and affect. Her behavior is normal. Judgment and thought content normal.    Review of Systems  Constitutional: Negative.   HENT: Negative.   Eyes: Negative.   Respiratory: Negative.   Cardiovascular: Negative.   Gastrointestinal: Negative.   Genitourinary: Negative.   Musculoskeletal: Negative.   Skin: Negative.   Neurological: Negative.   Endo/Heme/Allergies: Negative.   Psychiatric/Behavioral: Negative for depression, hallucinations and suicidal ideas. The patient is not nervous/anxious and does not have insomnia.     Blood pressure 95/62, pulse (!) 101, temperature 98.3 F (36.8 C), temperature source Oral, resp. rate 16, height  (1.651 m), weight 56.7 kg (125 lb), last menstrual period 11/06/2016, SpO2 100 %.Body mass index is 20.8 kg/m.  General Appearance: Casual  Eye Contact:  Good  Speech:  Clear and Coherent and Normal Rate  Volume:  Normal  Mood:  Euthymic  Affect:  Appropriate  Thought Process:  Coherent and Descriptions of Associations: Intact  Orientation:  Full (Time, Place, and Person)  Thought Content:  WDL  Suicidal Thoughts:  No  Homicidal Thoughts:  No  Memory:  Immediate;   Good Recent;   Good Remote;   Good  Judgement:  Good  Insight:  Good  Psychomotor Activity:  Normal  Concentration:  Concentration: Good and Attention Span: Good  Recall:  Good  Fund of Knowledge:  Good  Language:  Good  Akathisia:  No  Handed:  Right  AIMS (if indicated):     Assets:  Communication Skills Desire for Improvement Financial Resources/Insurance Housing Physical Health Social Support Transportation  ADL's:  Intact  Cognition:  WNL  Sleep:  Number of Hours: 6.25     Treatment Plan Summary: Daily contact with patient to assess and evaluate symptoms and progress in  treatment, Medication management and Plan is to:   -Continue Geodon 20 mg PO BID for mood stability -Continue Effexor-XR 37.5 mg PO Daily for mood stability -Continue Ativan 0.5 mg PO Q6H PRN for anxiety -Continue Vistaril 25 mg PO TID PRN for anxiety -Encourage group therapy participation   Maryfrances Bunnell, FNP 11/10/2016, 3:00 PM   Agree with NP Progress Note

## 2016-11-10 NOTE — [facility] Group Notes (Signed)
LCSW Group Therapy Note   11/10/2016 1:15pm   Type of Therapy and Topic:  Group Therapy:  Overcoming Obstacles   Participation Level:  Did Not Attend   Description of Group:    In this group patients will be encouraged to explore what they see as obstacles to their own wellness and recovery. They will be guided to discuss their thoughts, feelings, and behaviors related to these obstacles. The group will process together ways to cope with barriers, with attention given to specific choices patients can make. Each patient will be challenged to identify changes they are motivated to make in order to overcome their obstacles. This group will be process-oriented, with patients participating in exploration of their own experiences as well as giving and receiving support and challenge from other group members.   Therapeutic Goals: 1. Patient will identify personal and current obstacles as they relate to admission. 2. Patient will identify barriers that currently interfere with their wellness or overcoming obstacles.  3. Patient will identify feelings, thought process and behaviors related to these barriers. 4. Patient will identify two changes they are willing to make to overcome these obstacles:       Therapeutic Modalities:   Cognitive Behavioral Therapy Solution Focused Therapy Motivational Interviewing Relapse Prevention Therapy  Verdene Lennert, LCSW 11/10/2016 4:54 PM

## 2016-11-10 NOTE — Progress Notes (Signed)
DAR NOTE: Pt present with bright affect and jovial mood in the unit. Pt has been observed playing cards with peer in the day room. Pt reported for the first time since she got here she is feel much better and medications seems to be working. Complained of onstipation. Pt denies physical pain, took all her meds as scheduled. As per self inventory, pt had a good night sleep,fair appetite, high energy, and good concentration. Pt rate depression at 0, hopeless ness at 0, and anxiety at 0. Pt's safety ensured with 15 minute and environmental checks. Pt currently denies SI/HI and A/V hallucinations. Pt verbally agrees to seek staff if SI/HI or A/VH occurs and to consult with staff before acting on these thoughts. Will continue POC.

## 2016-11-10 NOTE — Progress Notes (Signed)
Recreation Therapy Notes  Date:  11/10/16  Time: 0930 Location: [address] Dayroom  Group Topic: Stress Management  Goal Area(s) Addresses:  Patient will verbalize importance of using healthy stress management.  Patient will identify positive emotions associated with healthy stress management.   Behavioral Response: Engaged  Intervention: Stress Management  Activity :  Progressive Muscle Relaxation.  LRT introduced the stress management technique of progressive muscle relaxation.  LRT read a script to allow patients to focus on each muscle group individually to tense and then relax the muscles.  Patients were to follow along as the script was read to engage in the activity.  Education:  Stress Management, Discharge Planning.   Education Outcome: Acknowledges edcuation/In group clarification offered/Needs additional education  Clinical Observations/Feedback: Pt attended group.   Caroll Rancher, LRT/CTRS         Caroll Rancher A 11/10/2016 11:38 AM

## 2016-11-10 NOTE — Progress Notes (Signed)
[name] had been up and visible in milieu this evening, did attend and participate in group activity. She was seen using the phone as well as interacting appropriately with peers in milieu. She spoke of her day and spoke about taking a nap earlier and spoke about how her day got better as it went along. She did endorse anxiety but not any agitation as she was feeling earlier in the morning. She was able to receive all bedtime medication without incident and did not verbalize any complaints of pain. A. Support and encouragement provided. R. Safety maintained, will continue to monitor.

## 2016-11-11 MED ORDER — HYDROXYZINE HCL 25 MG PO TABS: 25.0000 mg | ORAL_TABLET | Freq: Three times a day (TID) | ORAL | 0 refills | Status: DC | PRN

## 2016-11-11 MED ORDER — TRAZODONE HCL 50 MG PO TABS: 50.0000 mg | ORAL_TABLET | Freq: Every evening | ORAL | 0 refills | Status: DC | PRN

## 2016-11-11 MED ORDER — VENLAFAXINE HCL ER 37.5 MG PO CP24: 37.5000 mg | ORAL_CAPSULE | Freq: Every day | ORAL | 0 refills | Status: DC

## 2016-11-11 MED ORDER — ONDANSETRON 4 MG PO TBDP
4.0000 mg | ORAL_TABLET | Freq: Once | ORAL | Status: AC
  Administered 2016-11-11: 4 mg via ORAL
  Filled 2016-11-11 (×2): qty 1

## 2016-11-11 MED ORDER — LINACLOTIDE 145 MCG PO CAPS
145.0000 ug | ORAL_CAPSULE | Freq: Once | ORAL | Status: AC
  Administered 2016-11-11: 145 ug via ORAL
  Filled 2016-11-11: qty 1

## 2016-11-11 MED ORDER — ZIPRASIDONE HCL 20 MG PO CAPS: 20.0000 mg | ORAL_CAPSULE | Freq: Two times a day (BID) | ORAL | 0 refills | Status: DC

## 2016-11-11 NOTE — [facility] Suicide Risk Assessment (Signed)
[facility] Discharge Suicide Risk Assessment   Principal Problem: Bipolar 1 disorder, depressed, severe (HCC) Discharge Diagnoses:  Patient Active Problem List   Diagnosis Date Noted  . Bipolar affective disorder, currently depressed, moderate (HCC) [F31.32]   . Bipolar 1 disorder, depressed, severe (HCC) [F31.4] 11/06/2016  . Allergic conjunctivitis [H10.10] 10/13/2011  . Well adolescent visit [Z00.129] 07/14/2011  . PTSD (post-traumatic stress disorder) [F43.10] 07/14/2011  . Dysmenorrhea in the adolescent [N94.6] 07/14/2011  . Allergic rhinitis, mild [J30.9] 07/14/2011  . Anxiety [F41.9] 06/04/2011  . Depression [F32.9] 06/04/2011  . BMI (body mass index), pediatric, 95-99% for age [facility] 06/04/2011    Total Time spent with patient: 30 minutes  Musculoskeletal: Strength & Muscle Tone: within normal limits Gait & Station: normal Patient leans: N/A  Psychiatric Specialty Exam: ROS no headache, no chest pain, mild nausea, no vomiting,  reports constipation  Blood pressure 101/74, pulse 77, temperature 98.7 F (37.1 C), temperature source Oral, resp. rate 16, height  (1.651 m), weight 56.7 kg (125 lb), last menstrual period 11/06/2016, SpO2 100 %.Body mass index is 20.8 kg/m.  General Appearance: Well Groomed  Eye Contact::  Good  Speech:  Normal Rate409  Volume:  Normal  Mood:  reports mood is improved , states feels " a lot better", and describes mood as 8/10  Affect:  Appropriate and reactive   Thought Process:  Linear and Descriptions of Associations: Intact  Orientation:  Full (Time, Place, and Person)  Thought Content:  denies hallucinations, no delusions   Suicidal Thoughts:  No denies suicidal or self injurious ideations, denies homicidal or violent ideations  Homicidal Thoughts:  No  Memory:  recent and remote grossly intact   Judgement:  Other:  improved   Insight:  improved   Psychomotor Activity:  Normal  Concentration:  Good  Recall:  Good  Fund of  Knowledge:Good  Language: Good  Akathisia:  Negative  Handed:  Right  AIMS (if indicated):     Assets:  Communication Skills Desire for Improvement Resilience  Sleep:  Number of Hours: 5.25  Cognition: WNL  ADL's:  Intact   Mental Status Per Nursing Assessment::   On Admission:  NA  Demographic Factors:  21 year old single female, lives with BF and aunt.   Loss Factors: Recent trip to [place] to see her daughter. Recent job loss  Historical Factors: Has history of prior psychiatric admissions for depression, history of a prior suicide attempt by overdose 2015.   Risk Reduction Factors:   resilience, sense of responsibility to family, support network  Continued Clinical Symptoms:  At this time reports improved mood and presents with improving affect. Denies suicidal or self injurious ideations, no homicidal ideations, no psychotic symptoms, future oriented .  Denies medication side effects. Has been participative in milieu, presents pleasant on approach.    Cognitive Features That Contribute To Risk:  No gross cognitive deficits noted upon discharge. Is alert , attentive, and oriented x 3     Suicide Risk:  Mild:  Suicidal ideation of limited frequency, intensity, duration, and specificity.  There are no identifiable plans, no associated intent, mild dysphoria and related symptoms, good self-control (both objective and subjective assessment), few other risk factors, and identifiable protective factors, including available and accessible social support.  Follow-up Information    [facility] Follow up on 11/13/2016.   Why:  Hospital discharge appointment 10/18 at 9:45am. Please bring a photo ID, copy of insurance. and proof of income if you have  it. Contact information: [address] [place] [address] [phone]           Plan Of Care/Follow-up recommendations:  Activity:  as tolerated  Diet:  regular Tests:  NA Other:  see  below Patient is expressing readiness for discharge and is leaving unit in good spirits  Plans to return home  Plans to follow up as above  Craige Cotta, MD 11/11/2016, 12:46 PM

## 2016-11-11 NOTE — [facility] Suicide Risk Assessment (Signed)
[facility] INPATIENT:  Family/Significant Other Suicide Prevention Education  Suicide Prevention Education:  Patient Refusal for Family/Significant Other Suicide Prevention Education: The patient [name] has refused to provide written consent for family/significant other to be provided Family/Significant Other Suicide Prevention Education during admission and/or prior to discharge.  Physician notified.  Verdene Lennert 11/11/2016, 10:23 AM

## 2016-11-11 NOTE — Progress Notes (Signed)
Patient ID: [name], female   DOB: 1995-04-01, 21 y.o.   MRN: [id number] Patient educated on all of her discharge instructions and verbalized understanding.  Patient left hospital with all of her discharge instructions, medication samples, scripts for medications and all of her belongings.  Transportation was provided by her boyfriend who met her at the entrance of the hospital.

## 2016-11-11 NOTE — Progress Notes (Signed)
Recreation Therapy Notes  Animal-Assisted Activity (AAA) Program Checklist/Progress Notes Patient Eligibility Criteria Checklist & Daily Group note for Rec TxIntervention  Date: 10.16.2018 Time: 2:45pm Location: [address]   AAA/T Program Assumption of Risk Form signed by Patient/ or Parent Legal Guardian Yes  Patient is free of allergies or sever asthma Yes  Patient reports no fear of animals Yes  Patient reports no history of cruelty to animals Yes  Patient understands his/her participation is voluntary Yes  Patient washes hands before animal contact Yes  Patient washes hands after animal contact Yes  Behavioral Response: Appropriate   Education:Hand Washing, Appropriate Animal Interaction   Education Outcome: Acknowledges education.   Clinical Observations/Feedback: Patient attended session and interacted appropriately with therapy dog and peers. Patient asked appropriate questions about therapy dog and his training.   Marykay Lex Isadore Palecek, LRT/CTRS        Shalie Schremp L 11/11/2016 3:03 PM
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
## 2016-11-11 NOTE — Plan of Care (Signed)
Problem: Safety: Goal: Ability to identify and utilize support systems that promote safety will improve Outcome: Progressing Patient denies SI/HI/AVH, took all meds as scheduled.  Pt reports that she has not had a bowel movement in 4 days, complained of nausea and abdominal cramping.  Patient was given Zofran  per orders, Linzess capsule  ordered, Pharmacist notified, currently awaiting med delivery.  Patient verbalizes readiness for discharge, will continue to monitor.

## 2016-11-11 NOTE — Progress Notes (Signed)
Adult Psychoeducational Group Note  Date:  11/11/2016 Time:  2:21 AM  Group Topic/Focus:  Wrap-Up Group:   The focus of this group is to help patients review their daily goal of treatment and discuss progress on daily workbooks.  Participation Level:  Active  Participation Quality:  Appropriate  Affect:  Appropriate  Cognitive:  Appropriate  Insight: Appropriate  Engagement in Group:  Engaged  Modes of Intervention:  Discussion  Additional Comments:  Pt stated her goal for today was to talk with her doctor about her discharge plan. Pt stated she accomplished her goal and the plan is for her to be discharged tomorrow. Pt rated her overall day a 10.  [name] 11/11/2016, 2:21 AM

## 2016-11-11 NOTE — Progress Notes (Signed)
  [facility] Adult Case Management Discharge Plan :  Will you be returning to the same living situation after discharge:  Yes,  Pt returning home At discharge, do you have transportation home?: Yes,  Pt boyfriend to pick up Do you have the ability to pay for your medications: Yes,  Pt provided with samples and prescriptions  Release of information consent forms completed and in the chart;  Patient's signature needed at discharge.  Patient to Follow up at: Follow-up Information    [facility] Follow up on 11/13/2016.   Why:  Hospital discharge appointment 10/18 at 9:45am. Please bring a photo ID, copy of insurance. and proof of income if you have it. Contact information: [address] [place] [address] [phone]           Next level of care provider has access to [facility] Link:no  Safety Planning and Suicide Prevention discussed: Yes,  with Pt; declines family contact  Have you used any form of tobacco in the last 30 days? (Cigarettes, Smokeless Tobacco, Cigars, and/or Pipes): Yes  Has patient been referred to the Quitline?: Patient refused referral  Patient has been referred for addiction treatment: Yes  Verdene Lennert, LCSW 11/11/2016, 10:22 AM

## 2016-11-11 NOTE — Discharge Instructions (Addendum)
Reviewed with patient and she verbalizes understanding

## 2016-11-11 NOTE — Progress Notes (Signed)
D- Patient alert and oriented. Patient observed in milieu interacting well with peers.  Patient currently denies SI, HI, AVH, and pain.  Patient describes her day as "awesome" giving it a rating of "9.5" out of 10 with 10 being the best.  Patient states "I have a great peer group, meds working, no anxiety, and I had a good night sleep".  Patient reports that she is "happy and anxious at the same time" in regards to feelings about discharge.  Patient is due to discharge today and reports that she will be going back to a stressful situation.  Patient states "we have bills due on the 17th and my boyfriend tells me not to worry about them but I quit my job a month ago and just don't see how everything is going to work out".  No further complaints. A- Scheduled medications administered to patient, per MD orders. Support and encouragement provided.  Routine safety checks conducted every 15 minutes.  Patient informed to notify staff with problems or concerns. R- No adverse drug reactions noted. Patient contracts for safety at this time. Patient compliant with medications and treatment plan. Patient receptive, calm, and cooperative. Patient remains safe at this time.

## 2017-10-09 ENCOUNTER — Ambulatory Visit: Payer: Self-pay | Admitting: Clinical

## 2017-10-09 ENCOUNTER — Ambulatory Visit (INDEPENDENT_AMBULATORY_CARE_PROVIDER_SITE_OTHER): Payer: Self-pay | Admitting: Nurse Practitioner

## 2017-10-09 ENCOUNTER — Other Ambulatory Visit (HOSPITAL_COMMUNITY)
Admission: RE | Admit: 2017-10-09 | Discharge: 2017-10-09 | Disposition: A | Discharge status: Home / Self Care | Payer: Medicaid Other | Source: Ambulatory Visit | Attending: Nurse Practitioner | Admitting: Nurse Practitioner

## 2017-10-09 ENCOUNTER — Encounter: Payer: Self-pay | Admitting: Nurse Practitioner

## 2017-10-09 ENCOUNTER — Ambulatory Visit (INDEPENDENT_AMBULATORY_CARE_PROVIDER_SITE_OTHER): Payer: Self-pay | Admitting: Clinical

## 2017-10-09 VITALS — BP 123/78 | HR 88 | Wt 152.7 lb

## 2017-10-09 DIAGNOSIS — Z8759 Personal history of other complications of pregnancy, childbirth and the puerperium: Secondary | ICD-10-CM

## 2017-10-09 DIAGNOSIS — Z3481 Encounter for supervision of other normal pregnancy, first trimester: Secondary | ICD-10-CM

## 2017-10-09 DIAGNOSIS — F32A Depression, unspecified: Secondary | ICD-10-CM

## 2017-10-09 DIAGNOSIS — Z3480 Encounter for supervision of other normal pregnancy, unspecified trimester: Secondary | ICD-10-CM

## 2017-10-09 DIAGNOSIS — Z659 Problem related to unspecified psychosocial circumstances: Secondary | ICD-10-CM

## 2017-10-09 DIAGNOSIS — F314 Bipolar disorder, current episode depressed, severe, without psychotic features: Secondary | ICD-10-CM

## 2017-10-09 DIAGNOSIS — F329 Major depressive disorder, single episode, unspecified: Secondary | ICD-10-CM

## 2017-10-09 DIAGNOSIS — Z62819 Personal history of unspecified abuse in childhood: Secondary | ICD-10-CM

## 2017-10-09 DIAGNOSIS — Z3A09 9 weeks gestation of pregnancy: Secondary | ICD-10-CM | POA: Insufficient documentation

## 2017-10-09 DIAGNOSIS — F17200 Nicotine dependence, unspecified, uncomplicated: Secondary | ICD-10-CM

## 2017-10-09 DIAGNOSIS — F419 Anxiety disorder, unspecified: Secondary | ICD-10-CM

## 2017-10-09 DIAGNOSIS — F3163 Bipolar disorder, current episode mixed, severe, without psychotic features: Secondary | ICD-10-CM

## 2017-10-09 DIAGNOSIS — O2341 Unspecified infection of urinary tract in pregnancy, first trimester: Secondary | ICD-10-CM

## 2017-10-09 LAB — POCT URINALYSIS DIP (DEVICE)
Bilirubin Urine: NEGATIVE
Glucose, UA: NEGATIVE mg/dL
Hgb urine dipstick: NEGATIVE
Nitrite: POSITIVE — AB
PH: 7 (ref 5.0–8.0)
PROTEIN: NEGATIVE mg/dL
SPECIFIC GRAVITY, URINE: 1.01 (ref 1.005–1.030)
UROBILINOGEN UA: 0.2 mg/dL (ref 0.0–1.0)

## 2017-10-09 MED ORDER — ZIPRASIDONE HCL 40 MG PO CAPS: 40.0000 mg | ORAL_CAPSULE | Freq: Every day | ORAL | 1 refills | Status: DC

## 2017-10-09 MED ORDER — ZIPRASIDONE HCL 20 MG PO CAPS: 20.0000 mg | ORAL_CAPSULE | Freq: Every day | ORAL | 0 refills | Status: DC

## 2017-10-09 NOTE — Progress Notes (Signed)
8Pt not sure if can Breast fee due to Mental Disorder Meds.Pt states only pees twice a day with pain on her left side.

## 2017-10-09 NOTE — BH Specialist Note (Signed)
  Dr Lucianne MussKumar recommends pt take Geodon, 20mg  at night for 1 week, then increase to 40 mg daily  Integrated Behavioral Health Initial Visit  MRN: [id number] Name: [name]  Number of Integrated Behavioral Health Clinician visits:: 1/6 Session Start time: 4:29  Session End time: 4:55  Total time: 20 minutes  Type of Service: Integrated Behavioral Health- Individual/Family Interpretor:No. Interpretor Name and Language: n/a   Warm Hand Off Completed.       SUBJECTIVE: [name] is a 22 y.o. female accompanied by n/a Patient was referred by Nolene Bernheimerri Burleson,  for unmedicated bipolar disorder. Patient reports the following symptoms/concerns: Pt states her primary concern today is being unmedicated for her Bipolar 1 disorder for 2 months; pt is feeling nauseous today, and would like to come back and talk further at her next visit.  Duration of problem: Current pregnancy unmedicated; Severity of problem: severe  OBJECTIVE: Mood: Normal(with first trimester sickness) and Affect: Appropriate Risk of harm to self or others: No plan to harm self or others  LIFE CONTEXT: Family and Social: Pt moved recently from [place] to [place]; lives with FOB  School/Work: - Self-Care: - Life Changes: Current pregnancy; move to [place] from [place]iginally from [place]  GOALS ADDRESSED: Patient will: 1. Reduce symptoms of: mood instability  2. Demonstrate ability to: Increase healthy adjustment to current life circumstances and Improve medication compliance  INTERVENTIONS: Interventions utilized: Supportive Counseling and Medication Monitoring  Standardized Assessments completed: Patient declined screening Feeling very nauseous today  ASSESSMENT: Patient currently experiencing Bipolar affective disorder, currently active, mixed, severe, without psychotic features.   Patient may benefit from  Brief therapeutic interventions today.  PLAN: 1. Follow up with behavioral health clinician on : [facility] 2. Behavioral recommendations:  -Take medication, as prescribed by medical provider today  3. Referral(s[facility] 4. "From scale of 1-10, how likely are you to follow plan?": 10  Jamie C McMannes, LCSW
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
## 2017-10-12 DIAGNOSIS — Z659 Problem related to unspecified psychosocial circumstances: Secondary | ICD-10-CM | POA: Insufficient documentation

## 2017-10-12 DIAGNOSIS — F17200 Nicotine dependence, unspecified, uncomplicated: Secondary | ICD-10-CM | POA: Insufficient documentation

## 2017-10-12 DIAGNOSIS — O2341 Unspecified infection of urinary tract in pregnancy, first trimester: Secondary | ICD-10-CM | POA: Insufficient documentation

## 2017-10-12 DIAGNOSIS — Z62819 Personal history of unspecified abuse in childhood: Secondary | ICD-10-CM | POA: Insufficient documentation

## 2017-10-12 LAB — CYTOLOGY - PAP
BACTERIAL VAGINITIS: POSITIVE — AB
CANDIDA VAGINITIS: NEGATIVE
Chlamydia: NEGATIVE
DIAGNOSIS: NEGATIVE
NEISSERIA GONORRHEA: NEGATIVE
Trichomonas: NEGATIVE

## 2017-10-12 LAB — URINE CULTURE, OB REFLEX

## 2017-10-12 LAB — CULTURE, OB URINE

## 2017-10-12 MED ORDER — METRONIDAZOLE 500 MG PO TABS: 500.0000 mg | ORAL_TABLET | Freq: Two times a day (BID) | ORAL | 0 refills | Status: DC

## 2017-10-12 MED ORDER — NITROFURANTOIN MONOHYD MACRO 100 MG PO CAPS: 100.0000 mg | ORAL_CAPSULE | Freq: Two times a day (BID) | ORAL | 0 refills | Status: DC

## 2017-10-12 NOTE — Progress Notes (Signed)
UTI noted on urine culture from NOB visit.  Client reports allergy to Penicillins - severity of this allergy was not discussed at recent visit so will not prescribe Keflex at this time due to the possible allergy.  Will prescribe Macrobid BID for 7 days and have clinical staff call client with this info.  Additionally on other labs received today, client has BV.  Medication sent to her pharmacy.  Note to clinical staff to call her.  Plan will be to complete meds for UTI first and then take meds for BV due to nausea and once a day vomiting she is having.  Nolene BernheimERRI Cherise Fedder, RN, MSN, NP-BC Nurse Practitioner, [facility], [facility] 10/12/2017 2:55 PM

## 2017-10-13 ENCOUNTER — Telehealth: Payer: Self-pay | Admitting: Clinical

## 2017-10-13 DIAGNOSIS — Z23 Encounter for immunization: Secondary | ICD-10-CM | POA: Diagnosis present

## 2017-10-13 NOTE — Progress Notes (Signed)
Error

## 2017-10-13 NOTE — Telephone Encounter (Signed)
[facility] Medication Management Phone Note  MRN: [id number] NAME: [name]  [name] C [name], LCSW   Left HIPPA-compliant message to call back Asher MuirJamie from [facility]  at [phone].

## 2017-10-19 LAB — OBSTETRIC PANEL, INCLUDING HIV
Antibody Screen: NEGATIVE
Basophils Absolute: 0 10*3/uL (ref 0.0–0.2)
Basos: 0 %
EOS (ABSOLUTE): 0.2 10*3/uL (ref 0.0–0.4)
EOS: 1 %
HEMOGLOBIN: 14.6 g/dL (ref 11.1–15.9)
HEP B S AG: NEGATIVE
HIV Screen 4th Generation wRfx: REACTIVE — AB
Hematocrit: 42.3 % (ref 34.0–46.6)
IMMATURE GRANS (ABS): 0 10*3/uL (ref 0.0–0.1)
IMMATURE GRANULOCYTES: 0 %
Lymphocytes Absolute: 1.5 10*3/uL (ref 0.7–3.1)
Lymphs: 14 %
MCH: 31.5 pg (ref 26.6–33.0)
MCHC: 34.5 g/dL (ref 31.5–35.7)
MCV: 91 fL (ref 79–97)
MONOS ABS: 0.7 10*3/uL (ref 0.1–0.9)
Monocytes: 7 %
NEUTROS PCT: 78 %
Neutrophils Absolute: 8 10*3/uL — ABNORMAL HIGH (ref 1.4–7.0)
Platelets: 288 10*3/uL (ref 150–450)
RBC: 4.64 x10E6/uL (ref 3.77–5.28)
RDW: 13.6 % (ref 12.3–15.4)
RH TYPE: POSITIVE
RPR: NONREACTIVE
Rubella Antibodies, IGG: 1.78 index (ref >0.99)
WBC: 10.4 10*3/uL (ref 3.4–10.8)

## 2017-10-19 LAB — SMN1 COPY NUMBER ANALYSIS (SMA CARRIER SCREENING)

## 2017-10-19 LAB — HIV 1/2 AB DIFFERENTIATION
HIV 1 AB: NEGATIVE
HIV 2 Ab: NEGATIVE
NOTE (HIV CONF MULTIP: NEGATIVE

## 2017-10-19 LAB — HEMOGLOBINOPATHY EVALUATION
FERRITIN: 42 ng/mL (ref 15–150)
HGB SOLUBILITY: NEGATIVE
HGB VARIANT: 0 %
Hgb A2 Quant: 2.2 % (ref 1.8–3.2)
Hgb A: 97.8 % (ref 96.4–98.8)
Hgb C: 0 %
Hgb F Quant: 0 % (ref 0.0–2.0)
Hgb S: 0 %

## 2017-10-19 LAB — RNA QUALITATIVE: HIV 1 RNA QUALITATIVE: NEGATIVE

## 2017-10-19 LAB — CYSTIC FIBROSIS GENE TEST

## 2017-10-22 ENCOUNTER — Encounter: Payer: Self-pay | Admitting: *Deleted

## 2017-11-05 ENCOUNTER — Encounter: Payer: Self-pay | Admitting: Student

## 2017-11-05 DIAGNOSIS — Z880 Allergy status to penicillin: Secondary | ICD-10-CM | POA: Insufficient documentation

## 2017-11-06 ENCOUNTER — Ambulatory Visit (INDEPENDENT_AMBULATORY_CARE_PROVIDER_SITE_OTHER): Payer: Self-pay | Admitting: Student

## 2017-11-06 VITALS — BP 97/66 | HR 82 | Wt 170.5 lb

## 2017-11-06 DIAGNOSIS — Z3481 Encounter for supervision of other normal pregnancy, first trimester: Secondary | ICD-10-CM

## 2017-11-06 DIAGNOSIS — O2341 Unspecified infection of urinary tract in pregnancy, first trimester: Secondary | ICD-10-CM

## 2017-11-06 DIAGNOSIS — Z3480 Encounter for supervision of other normal pregnancy, unspecified trimester: Secondary | ICD-10-CM
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
## 2017-11-06 NOTE — Patient Instructions (Addendum)
Second Trimester of Pregnancy The second trimester is from week 13 through week 28, month 4 through 6. This is often the time in pregnancy that you feel your best. Often times, morning sickness has lessened or quit. You may have more energy, and you may get hungry more often. Your unborn baby (fetus) is growing rapidly. At the end of the sixth month, he or she is about 9 inches long and weighs about 1 pounds. You will likely feel the baby move (quickening) between 18 and 20 weeks of pregnancy.  Research childbirth classes and hospital preregistration at [email or web address]  Follow these instructions at home:  Avoid all smoking, herbs, and alcohol. Avoid drugs not approved by your doctor.  Do not use any tobacco products, including cigarettes, chewing tobacco, and electronic cigarettes. If you need help quitting, ask your doctor. You may get counseling or other support to help you quit.  Only take medicine as told by your doctor. Some medicines are safe and some are not during pregnancy.  Exercise only as told by your doctor. Stop exercising if you start having cramps.  Eat regular, healthy meals.  Wear a good support bra if your breasts are tender.  Do not use hot tubs, steam rooms, or saunas.  Wear your seat belt when driving.  Avoid raw meat, uncooked cheese, and liter boxes and soil used by cats.  Take your prenatal vitamins.  Take 1500-2000 milligrams of calcium daily starting at the 20th week of pregnancy until you deliver your baby.  Try taking medicine that helps you poop (stool softener) as needed, and if your doctor approves. Eat more fiber by eating fresh fruit, vegetables, and whole grains. Drink enough fluids to keep your pee (urine) clear or pale yellow.  Take warm water baths (sitz baths) to soothe pain or discomfort caused by hemorrhoids. Use hemorrhoid cream if your doctor approves.  If you have puffy, bulging veins (varicose veins), wear support hose. Raise  (elevate) your feet for 15 minutes, 3-4 times a day. Limit salt in your diet.  Avoid heavy lifting, wear low heals, and sit up straight.  Rest with your legs raised if you have leg cramps or low back pain.  Visit your dentist if you have not gone during your pregnancy. Use a soft toothbrush to brush your teeth. Be gentle when you floss.  You can have sex (intercourse) unless your doctor tells you not to.  Go to your doctor visits.  Get help if:  You feel dizzy.  You have mild cramps or pressure in your lower belly (abdomen).  You have a nagging pain in your belly area.  You continue to feel sick to your stomach (nauseous), throw up (vomit), or have watery poop (diarrhea).  You have bad smelling fluid coming from your vagina.  You have pain with peeing (urination). Get help right away if:  You have a fever.  You are leaking fluid from your vagina.  You have spotting or bleeding from your vagina.  You have severe belly cramping or pain.  You lose or gain weight rapidly.  You have trouble catching your breath and have chest pain.  You notice sudden or extreme puffiness (swelling) of your face, hands, ankles, feet, or legs.  You have not felt the baby move in over an hour.  You have severe headaches that do not go away with medicine.  You have vision changes. This information is not intended to replace advice given to you by your health care provider. Make   sure you discuss any questions you have with your health care provider. Document Released: 04/09/2009 Document Revised: 06/21/2015 Document Reviewed: 03/16/2012 Elsevier Interactive Patient Education  2017 [employer]    

## 2017-11-08 LAB — URINE CULTURE, OB REFLEX: Organism ID, Bacteria: NO GROWTH

## 2017-11-08 LAB — CULTURE, OB URINE

## 2017-11-26 ENCOUNTER — Encounter: Payer: Self-pay | Admitting: Student

## 2017-11-27 ENCOUNTER — Ambulatory Visit (INDEPENDENT_AMBULATORY_CARE_PROVIDER_SITE_OTHER): Payer: Self-pay | Admitting: Student

## 2017-11-27 ENCOUNTER — Telehealth: Payer: Self-pay | Admitting: Clinical

## 2017-11-27 VITALS — BP 107/54 | HR 86 | Wt 177.7 lb

## 2017-11-27 DIAGNOSIS — Z3482 Encounter for supervision of other normal pregnancy, second trimester: Secondary | ICD-10-CM

## 2017-11-27 DIAGNOSIS — F419 Anxiety disorder, unspecified: Secondary | ICD-10-CM

## 2017-11-27 DIAGNOSIS — F3163 Bipolar disorder, current episode mixed, severe, without psychotic features: Secondary | ICD-10-CM

## 2017-11-27 DIAGNOSIS — F5105 Insomnia due to other mental disorder: Secondary | ICD-10-CM

## 2017-11-27 DIAGNOSIS — Z3480 Encounter for supervision of other normal pregnancy, unspecified trimester: Secondary | ICD-10-CM

## 2017-11-27 DIAGNOSIS — F99 Mental disorder, not otherwise specified: Secondary | ICD-10-CM

## 2017-11-27 MED ORDER — QUETIAPINE FUMARATE 100 MG PO TABS: 100.0000 mg | ORAL_TABLET | Freq: Every day | ORAL | 0 refills | Status: DC

## 2017-11-27 NOTE — Patient Instructions (Addendum)
Safe Medications in Pregnancy   Acne: Benzoyl Peroxide Salicylic Acid  Backache/Headache: Tylenol: 2 regular strength every 4 hours OR              2 Extra strength every 6 hours  Colds/Coughs/Allergies: Benadryl (alcohol free) 25 mg every 6 hours as needed Breath right strips Claritin Cepacol throat lozenges Chloraseptic throat spray Cold-Eeze- up to three times per day Cough drops, alcohol free Flonase (by prescription only) Guaifenesin Mucinex Robitussin DM (plain only, alcohol free) Saline nasal spray/drops Sudafed (pseudoephedrine) & Actifed ** use only after [redacted] weeks gestation and if you do not have high blood pressure Tylenol Vicks Vaporub Zinc lozenges Zyrtec   Constipation: Colace Ducolax suppositories Fleet enema Glycerin suppositories Metamucil Milk of magnesia Miralax Senokot Smooth move tea  Diarrhea: Kaopectate Imodium A-D  *NO pepto Bismol  Hemorrhoids: Anusol Anusol HC Preparation H Tucks  Indigestion: Tums Maalox Mylanta Zantac  Pepcid  Insomnia: Benadryl (alcohol free) 21m every 6 hours as needed Tylenol PM Unisom, no Gelcaps  Leg Cramps: Tums MagGel  Nausea/Vomiting:  Bonine Dramamine Emetrol Ginger extract Sea bands Meclizine  Nausea medication to take during pregnancy:  Unisom (doxylamine succinate 25 mg tablets) Take one tablet daily at bedtime. If symptoms are not adequately controlled, the dose can be increased to a maximum recommended dose of two tablets daily (1/2 tablet in the morning, 1/2 tablet mid-afternoon and one at bedtime). Vitamin B6 1069mtablets. Take one tablet twice a day (up to 200 mg per day).  Skin Rashes: Aveeno products Benadryl cream or 2576mvery 6 hours as needed Calamine Lotion 1% cortisone cream  Yeast infection: Gyne-lotrimin 7 Monistat 7  Gum/tooth pain: Anbesol  **If taking multiple medications, please check labels to avoid duplicating the same active ingredients **take  medication as directed on the label ** Do not exceed 4000 mg of tylenol in 24 hours **Do not take medications that contain aspirin or ibuprofen         Childbirth Education Options: [facility] Classes:  Childbirth education classes can help you get ready for a positive parenting experience. You can also meet other expectant parents and get free stuff for your baby. Each class runs for five weeks on the same night and costs $45 for the mother-to-be and her support person. Medicaid covers the cost if you are eligible. Call [phone] register. [facility] Education:  [phone] [email or web address]  Baby & Me Class: Discuss newborn & infant parenting and family adjustment issues with other new mothers in a relaxed environment. Each week brings a new speaker or baby-centered activity. We encourage new mothers to join us [place] Thursday at 11:00am. Babies birth until crawling. No registration or fee. Daddy BooWESCO Internationalhis course offers Dads-to-be the tools and knowledge needed to feel confident on their journey to becoming new fathers. Experienced dads, who have been trained as coaches, teach dads-to-be how to hold, comfort, diaper, swaddle and play with their infant while being able to support the new mom as well. A class for men taught by men. $25/dad Big Brother/Big Sister: Let your children share in the joy of a new brother or sister in this special class designed just for them. Class includes discussion about how families care for babies: swaddling, holding, diapering, safety as well as how they can be helpful in their new role. This class is designed for children ages 2 t65 6, 21ut any age is welcome. Please register each child individually. $5/child  Mom  Talk: This mom-led group offers support and connection to mothers as they journey through the adjustments and struggles of that sometimes overwhelming first year after the  birth of a child. Tuesdays at 10:00am and Thursdays at 6:00pm. Babies welcome. No registration or fee. Breastfeeding Support Group: This group is a mother-to-mother support circle where moms have the opportunity to share their breastfeeding experiences. A Lactation Consultant is present for questions and concerns. Meets each Tuesday at 11:00am. No fee or registration. Breastfeeding Your Baby: Learn what to expect in the first days of breastfeeding your newborn.  This class will help you feel more confident with the skills needed to begin your breastfeeding experience. Many new mothers are concerned about breastfeeding after leaving the hospital. This class will also address the most common fears and challenges about breastfeeding during the first few weeks, months and beyond. (call for fee) Comfort Techniques and Tour: This 2 hour interactive class will provide you the opportunity to learn & practice hands-on techniques that can help relieve some of the discomfort of labor and encourage your baby to rotate toward the best position for birth. You and your partner will be able to try a variety of labor positions with birth balls and rebozos as well as practice breathing, relaxation, and visualization techniques. A tour of the [facility] is included with this class. $20 per registrant and support person Childbirth Class- Weekend Option: This class is a Weekend version of our Birth & Baby series. It is designed for parents who have a difficult time fitting several weeks of classes into their schedule. It covers the care of your newborn and the basics of labor and childbirth. It also includes a [place] [facility] and lunch. The class is held two consecutive days: beginning on Friday evening from 6:30 - 8:30 p.m. and the next day, Saturday from 9 a.m. - 4 p.m. (call for fee) Doren Custard Class: Interested in a waterbirth?  This informational class will help you  discover whether waterbirth is the right fit for you. Education about waterbirth itself, supplies you would need and how to assemble your support team is what you can expect from this class. Some obstetrical practices require this class in order to pursue a waterbirth. (Not all obstetrical practices offer waterbirth-check with your healthcare provider.) Register only the expectant mom, but you are encouraged to bring your partner to class! Required if planning waterbirth, no fee. Infant/Child CPR: Parents, grandparents, babysitters, and friends learn Cardio-Pulmonary Resuscitation skills for infants and children. You will also learn how to treat both conscious and unconscious choking in infants and children. This Family & Friends program does not offer certification. Register each participant individually to ensure that enough mannequins are available. (Call for fee) Grandparent Love: Expecting a grandbaby? This class is for you! Learn about the latest infant care and safety recommendations and ways to support your own child as he or she transitions into the parenting role. Taught by Registered Nurses who are childbirth instructors, but most importantly...they are grandmothers too! $10/person. Childbirth Class- Natural Childbirth: This series of 5 weekly classes is for expectant parents who want to learn and practice natural methods of coping with the process of labor and childbirth. Relaxation, breathing, massage, visualization, role of the partner, and helpful positioning are highlighted. Participants learn how to be confident in their body's ability to give birth. This class will empower and help parents make informed decisions about their own care. Includes discussion that will help  new parents transition into the immediate postpartum period. [facility] is included. We suggest taking this class between 25-32 weeks, but it's only a recommendation. $75 per registrant and one  support person or $30 Medicaid. Childbirth Class- 3 week Series: This option of 3 weekly classes helps you and your labor partner prepare for childbirth. Newborn care, labor & birth, cesarean birth, pain management, and comfort techniques are discussed and a [place] [facility] is included. The class meets at the same time, on the same day of the week for 3 consecutive weeks beginning with the starting date you choose. $60 for registrant and one support person.  Marvelous Multiples: Expecting twins, triplets, or more? This class covers the differences in labor, birth, parenting, and breastfeeding issues that face multiples' parents. NICU tour is included. Led by a Certified Childbirth Educator who is the mother of twins. No fee. Caring for Baby: This class is for expectant and adoptive parents who want to learn and practice the most up-to-date newborn care for their babies. Focus is on birth through the first six weeks of life. Topics include feeding, bathing, diapering, crying, umbilical cord care, circumcision care and safe sleep. Parents learn to recognize symptoms of illness and when to call the pediatrician. Register only the mom-to-be and your partner or support person can plan to come with you! $10 per registrant and support person Childbirth Class- online option: This online class offers you the freedom to complete a Birth and Baby series in the comfort of your own home. The flexibility of this option allows you to review sections at your own pace, at times convenient to you and your support people. It includes additional video information, animations, quizzes, and extended activities. Get organized with helpful eClass tools, checklists, and trackers. Once you register online for the class, you will receive an email within a few days to accept the invitation and begin the class when the time is right for you. The content will be available to you for 60 days. $60 for 60 days of  online access for you and your support people.  Local Doulas: Natural Baby Doulas [email or web address] Tel: [phone] [email or web address] [email or web address] [phone] [email or web address] The Labor Hassell Halim  (also do waterbirth tub rental) [phone] [email or web address] [email or web address] Birth Doula [phone] [email or web address] [facility] Rhythms  [phone] [email or web address] [employer] (PADA) [email or web address] [place] [facility]  [email or web address] Considering Waterbirth? Guide for patients at [facility] [employer]  Why consider waterbirth?  . Gentle birth for babies . Less pain medicine used in labor . May allow for passive descent/less pushing . May reduce perineal tears  . More mobility and instinctive maternal position changes . Increased maternal relaxation . Reduced blood pressure in labor  Is waterbirth safe? What are the risks of infection, drowning or other complications?  . Infection: o Very low risk (3.7 % for tub vs 4.8% for bed) o 7 in 8000 waterbirths with documented infection o Poorly cleaned equipment most common cause o Slightly lower group B strep transmission rate  . Drowning o Maternal:  - Very low risk   - Related to seizures or fainting o Newborn:  - Very low risk. No evidence of increased risk of respiratory problems in multiple large studies - Physiological protection from breathing under water - Avoid underwater birth if there are any fetal complications - Once baby's head is out of the water, keep it out.  . Birth complication o  Some reports of cord trauma, but risk decreased by bringing baby to surface gradually o No evidence of increased risk of shoulder dystocia. Mothers can usually change positions faster in  water than in a bed, possibly aiding the maneuvers to free the shoulder.   You must attend a Doren Custard class at [facility]  3rd Wednesday of every month from 7-9pm  [employer] by calling [phone] or online at [email or web address]  Bring [place] the certificate from the class to your prenatal appointment  Meet with a midwife at 36 weeks to see if you can still plan a waterbirth and to sign the consent.   Purchase or rent the following supplies:   Water Birth Pool (Birth Pool in a Box or [place] for instance)  (Tubs start ~$125)  Single-use disposable tub liner designed for your brand of tub  New garden hose labeled "lead-free", "suitable for drinking water",  Electric drain pump to remove water (We recommend 792 gallon per hour or greater pump.)   Separate garden hose to remove the dirty water  Fish net  Bathing suit top (optional)  Long-handled mirror (optional)  Places to purchase or rent supplies  GotWebTools.is for tub purchases and supplies  Waterbirthsolutions.com for tub purchases and supplies  The Labor Ladies (www.[email or web address]) $275 for tub rental/set-up & take down/kit   [employer] (http://www.fleming.com/.htm) Information regarding doulas (labor support) who provide pool rentals  Our practice has a Birth Pool in a Box tub at the hospital that you may borrow on a first-come-first-served basis. It is your responsibility to to set up, clean and break down the tub. We cannot guarantee the availability of this tub in advance. You are responsible for bringing all accessories listed above. If you do not have all necessary supplies you cannot have a waterbirth.    Things that would prevent you from having a waterbirth:  Premature, <37wks  Previous cesarean birth  Presence of thick meconium-stained fluid  Multiple gestation (Twins, triplets, etc.)  Uncontrolled diabetes or gestational diabetes requiring  medication  Hypertension requiring medication or diagnosis of pre-eclampsia  Heavy vaginal bleeding  Non-reassuring fetal heart rate  Active infection (MRSA, etc.). Group B Strep is NOT a contraindication for  waterbirth.  If your labor has to be induced and induction method requires continuous  monitoring of the baby's heart rate  Other risks/issues identified by your obstetrical provider  Please remember that birth is unpredictable. Under certain unforeseeable circumstances your provider may advise against giving birth in the tub. These decisions will be made on a case-by-case basis and with the safety of you and your baby as our highest priority.
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
## 2017-11-27 NOTE — Telephone Encounter (Signed)
As recommended by Dr Lucianne Muss, pt will discontinue Geodon and begin taking Seroquel at bedtime. Pt agrees to begin taking Seroquel, and says she will go back to [facility] for ongoing [facility] treatment.

## 2017-11-30 ENCOUNTER — Institutional Professional Consult (permissible substitution): Payer: Self-pay

## 2017-11-30 LAB — AFP, SERUM, OPEN SPINA BIFIDA
AFP MOM: 0.91
AFP Value: 29.8 ng/mL
GEST. AGE ON COLLECTION DATE: 16.7 wk
Maternal Age At EDD: 24.9 yr
OSBR RISK 1 IN: 10000
Test Results:: NEGATIVE
Weight: 177 [lb_av]

## 2017-12-03 ENCOUNTER — Encounter: Payer: Self-pay | Admitting: *Deleted

## 2017-12-07 ENCOUNTER — Encounter (HOSPITAL_COMMUNITY): Payer: Self-pay

## 2017-12-14 ENCOUNTER — Encounter: Payer: Self-pay | Admitting: *Deleted

## 2017-12-15 ENCOUNTER — Ambulatory Visit (INDEPENDENT_AMBULATORY_CARE_PROVIDER_SITE_OTHER): Payer: Self-pay | Admitting: Obstetrics & Gynecology

## 2017-12-15 ENCOUNTER — Ambulatory Visit (HOSPITAL_COMMUNITY)
Admission: RE | Admit: 2017-12-15 | Discharge: 2017-12-15 | Disposition: A | Discharge status: Home / Self Care | Payer: Medicaid Other | Source: Ambulatory Visit | Attending: Nurse Practitioner | Admitting: Nurse Practitioner

## 2017-12-15 ENCOUNTER — Other Ambulatory Visit: Payer: Self-pay | Admitting: Nurse Practitioner

## 2017-12-15 ENCOUNTER — Ambulatory Visit (INDEPENDENT_AMBULATORY_CARE_PROVIDER_SITE_OTHER): Payer: Self-pay | Admitting: Clinical

## 2017-12-15 VITALS — BP 136/62 | HR 100 | Wt 173.3 lb

## 2017-12-15 DIAGNOSIS — Z3482 Encounter for supervision of other normal pregnancy, second trimester: Secondary | ICD-10-CM

## 2017-12-15 DIAGNOSIS — O99342 Other mental disorders complicating pregnancy, second trimester: Secondary | ICD-10-CM | POA: Diagnosis not present

## 2017-12-15 DIAGNOSIS — Z3A19 19 weeks gestation of pregnancy: Secondary | ICD-10-CM

## 2017-12-15 DIAGNOSIS — Z3480 Encounter for supervision of other normal pregnancy, unspecified trimester: Secondary | ICD-10-CM

## 2017-12-15 DIAGNOSIS — O09892 Supervision of other high risk pregnancies, second trimester: Secondary | ICD-10-CM | POA: Diagnosis not present

## 2017-12-15 DIAGNOSIS — Z363 Encounter for antenatal screening for malformations: Secondary | ICD-10-CM | POA: Insufficient documentation

## 2017-12-15 DIAGNOSIS — F314 Bipolar disorder, current episode depressed, severe, without psychotic features: Secondary | ICD-10-CM

## 2017-12-15 DIAGNOSIS — F3163 Bipolar disorder, current episode mixed, severe, without psychotic features: Secondary | ICD-10-CM

## 2017-12-15 MED ORDER — SERTRALINE HCL 50 MG PO TABS: 50.0000 mg | ORAL_TABLET | Freq: Every day | ORAL | 2 refills | Status: DC

## 2017-12-15 NOTE — Patient Instructions (Signed)
Second Trimester of Pregnancy The second trimester is from week 13 through week 28, month 4 through 6. This is often the time in pregnancy that you feel your best. Often times, morning sickness has lessened or quit. You may have more energy, and you may get hungry more often. Your unborn baby (fetus) is growing rapidly. At the end of the sixth month, he or she is about 9 inches long and weighs about 1 pounds. You will likely feel the baby move (quickening) between 18 and 20 weeks of pregnancy. Follow these instructions at home:  Avoid all smoking, herbs, and alcohol. Avoid drugs not approved by your doctor.  Do not use any tobacco products, including cigarettes, chewing tobacco, and electronic cigarettes. If you need help quitting, ask your doctor. You may get counseling or other support to help you quit.  Only take medicine as told by your doctor. Some medicines are safe and some are not during pregnancy.  Exercise only as told by your doctor. Stop exercising if you start having cramps.  Eat regular, healthy meals.  Wear a good support bra if your breasts are tender.  Do not use hot tubs, steam rooms, or saunas.  Wear your seat belt when driving.  Avoid raw meat, uncooked cheese, and liter boxes and soil used by cats.  Take your prenatal vitamins.  Take 1500-2000 milligrams of calcium daily starting at the 20th week of pregnancy until you deliver your baby.  Try taking medicine that helps you poop (stool softener) as needed, and if your doctor approves. Eat more fiber by eating fresh fruit, vegetables, and whole grains. Drink enough fluids to keep your pee (urine) clear or pale yellow.  Take warm water baths (sitz baths) to soothe pain or discomfort caused by hemorrhoids. Use hemorrhoid cream if your doctor approves.  If you have puffy, bulging veins (varicose veins), wear support hose. Raise (elevate) your feet for 15 minutes, 3-4 times a day. Limit salt in your diet.  Avoid heavy  lifting, wear low heals, and sit up straight.  Rest with your legs raised if you have leg cramps or low back pain.  Visit your dentist if you have not gone during your pregnancy. Use a soft toothbrush to brush your teeth. Be gentle when you floss.  You can have sex (intercourse) unless your doctor tells you not to.  Go to your doctor visits. Get help if:  You feel dizzy.  You have mild cramps or pressure in your lower belly (abdomen).  You have a nagging pain in your belly area.  You continue to feel sick to your stomach (nauseous), throw up (vomit), or have watery poop (diarrhea).  You have bad smelling fluid coming from your vagina.  You have pain with peeing (urination). Get help right away if:  You have a fever.  You are leaking fluid from your vagina.  You have spotting or bleeding from your vagina.  You have severe belly cramping or pain.  You lose or gain weight rapidly.  You have trouble catching your breath and have chest pain.  You notice sudden or extreme puffiness (swelling) of your face, hands, ankles, feet, or legs.  You have not felt the baby move in over an hour.  You have severe headaches that do not go away with medicine.  You have vision changes. This information is not intended to replace advice given to you by your health care provider. Make sure you discuss any questions you have with your health care   provider. Document Released: 04/09/2009 Document Revised: 06/21/2015 Document Reviewed: 03/16/2012 Elsevier Interactive Patient Education  2017 Elsevier Inc.  
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
## 2017-12-15 NOTE — BH Specialist Note (Signed)
Integrated Behavioral Health Follow Up Visit  MRN: [id number] Name: [name]  Number of Integrated Behavioral Health Clinician visits: 2/6 Session Start time: 10:13  Session End time: 10:38 Total time: 30 minutes  Type of Service: Integrated Behavioral Health- Individual/Family Interpretor:No. Interpretor Name and Language: n/a  SUBJECTIVE: Michele Likesaomi C Caley is a 22 y.o. female accompanied by n/a Patient was referred by Scheryl DarterJames Arnold, MD for bipolar disorder. Patient reports the following symptoms/concerns: Pt states her primary concern today is that she wants to prevent the negative postpartum experience she had after her first child, with self-reported postpartum depression, anxiety, psychosis, breastfeeding issues, being "taken off all my medicine" abruptly, leading to difficulty bonding with baby. Pt eager to learn self-coping strategy to help with anxiety today.  Duration of problem: Current pregnancy escalation; Severity of problem: severe  OBJECTIVE: Mood: Anxious and Affect: Appropriate Risk of harm to self or others: No plan to harm self or others  LIFE CONTEXT: Family and Social: Pt lives with FOB School/Work: - Self-Care: - Life Changes: Current pregnancy; [place] from [place]  GOALS ADDRESSED: Patient will: 1.  Reduce symptoms of: anxiety, depression, mood instability and stress  2.  Increase knowledge and/or ability of: self-management skills  3.  Demonstrate ability to: Increase healthy adjustment to current life circumstances  INTERVENTIONS: Interventions utilized:  Mindfulness or Management consultantelaxation Training and Psychoeducation and/or Health Education Standardized Assessments completed: GAD-7 and PHQ 9  ASSESSMENT: Patient currently experiencing Bipolar disorder, current episode mixed, severe, without psychotic features.   Patient may benefit from continued brief therapeutic interventions regarding coping with symptoms of anxiety and depression with mood  instability.  PLAN: 1. Follow up with behavioral health clinician on : Two weeks 2. Behavioral recommendations:  -Continue taking BH medication as prescribed by medical provider -CALM relaxation breathing exercise twice daily(with morning prenatal vitamin; at bedtime) for as long as remains helpful -Keep track of caffeine intake daily; consider having small consistent amount, rather than fluctuating quantities. (Note if different amounts seem to affect anxiety and quality of sleep).  3. Referral(s): [facility] ([facility] 4. "From scale of 1-10, how likely are you to follow plan?": 10  Rae LipsJamie C Timaya Bojarski, LCSW  [facility] 12/15/2017 11/27/2017  Decreased Interest 2 2  Down, Depressed, Hopeless 3 2  PHQ - 2 Score 5 4  Altered sleeping 2 3  Tired, decreased energy 3 2  Change in appetite 2 2  Feeling bad or failure about yourself  3 2  Trouble concentrating 2 2  Moving slowly or fidgety/restless 0 0  Suicidal thoughts 0 0  PHQ-9 Score 17 15   GAD 7 : Generalized Anxiety Score 12/15/2017 11/27/2017  Nervous, Anxious, on Edge 3 3  Control/stop worrying 3 3  Worry too much - different things 3 3  Trouble relaxing 3 2  Restless 1 0  Easily annoyed or irritable 3 2  Afraid - awful might happen 2 2  Total GAD 7 Score 18 15

## 2017-12-16 ENCOUNTER — Encounter: Payer: Self-pay | Admitting: Family Medicine

## 2017-12-29 ENCOUNTER — Other Ambulatory Visit: Payer: Self-pay

## 2017-12-29 DIAGNOSIS — F3163 Bipolar disorder, current episode mixed, severe, without psychotic features: Secondary | ICD-10-CM

## 2017-12-29 DIAGNOSIS — F5105 Insomnia due to other mental disorder: Secondary | ICD-10-CM

## 2017-12-29 DIAGNOSIS — F99 Mental disorder, not otherwise specified: Secondary | ICD-10-CM

## 2017-12-29 NOTE — Telephone Encounter (Signed)
Medication request for Seroquel routed to Dr. Debroah LoopArnold for advisement.

## 2017-12-30 MED ORDER — QUETIAPINE FUMARATE 100 MG PO TABS: 100.0000 mg | ORAL_TABLET | Freq: Every day | ORAL | 0 refills | Status: DC

## 2018-01-12 ENCOUNTER — Ambulatory Visit (INDEPENDENT_AMBULATORY_CARE_PROVIDER_SITE_OTHER): Payer: Medicaid Other | Admitting: Obstetrics and Gynecology

## 2018-01-12 ENCOUNTER — Encounter: Payer: Self-pay | Admitting: Obstetrics and Gynecology

## 2018-01-12 VITALS — BP 117/67 | HR 90 | Wt 188.5 lb

## 2018-01-12 DIAGNOSIS — F314 Bipolar disorder, current episode depressed, severe, without psychotic features: Secondary | ICD-10-CM

## 2018-01-12 DIAGNOSIS — Z8759 Personal history of other complications of pregnancy, childbirth and the puerperium: Secondary | ICD-10-CM

## 2018-01-12 DIAGNOSIS — Z3480 Encounter for supervision of other normal pregnancy, unspecified trimester: Secondary | ICD-10-CM

## 2018-01-12 DIAGNOSIS — Z3482 Encounter for supervision of other normal pregnancy, second trimester: Secondary | ICD-10-CM
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
## 2018-01-18 ENCOUNTER — Other Ambulatory Visit: Payer: Self-pay

## 2018-01-18 DIAGNOSIS — F99 Mental disorder, not otherwise specified: Secondary | ICD-10-CM

## 2018-01-18 DIAGNOSIS — F5105 Insomnia due to other mental disorder: Secondary | ICD-10-CM

## 2018-01-18 DIAGNOSIS — F3163 Bipolar disorder, current episode mixed, severe, without psychotic features: Secondary | ICD-10-CM

## 2018-01-21 ENCOUNTER — Other Ambulatory Visit: Payer: Self-pay

## 2018-01-21 DIAGNOSIS — F5105 Insomnia due to other mental disorder: Secondary | ICD-10-CM

## 2018-01-21 DIAGNOSIS — F99 Mental disorder, not otherwise specified: Secondary | ICD-10-CM

## 2018-01-21 DIAGNOSIS — F3163 Bipolar disorder, current episode mixed, severe, without psychotic features: Secondary | ICD-10-CM

## 2018-01-22 MED ORDER — QUETIAPINE FUMARATE 100 MG PO TABS: 100.0000 mg | ORAL_TABLET | Freq: Every day | ORAL | 0 refills | Status: DC
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
## 2018-02-15 ENCOUNTER — Encounter: Payer: Self-pay | Admitting: Student

## 2018-02-16 ENCOUNTER — Other Ambulatory Visit: Payer: Self-pay | Admitting: *Deleted

## 2018-02-16 DIAGNOSIS — Z3480 Encounter for supervision of other normal pregnancy, unspecified trimester: Secondary | ICD-10-CM

## 2018-02-17 ENCOUNTER — Ambulatory Visit (INDEPENDENT_AMBULATORY_CARE_PROVIDER_SITE_OTHER): Payer: Medicaid Other | Admitting: Obstetrics & Gynecology

## 2018-02-17 ENCOUNTER — Ambulatory Visit (INDEPENDENT_AMBULATORY_CARE_PROVIDER_SITE_OTHER): Payer: Self-pay | Admitting: Clinical

## 2018-02-17 ENCOUNTER — Encounter: Payer: Self-pay | Admitting: Nurse Practitioner

## 2018-02-17 ENCOUNTER — Other Ambulatory Visit: Payer: Medicaid Other

## 2018-02-17 VITALS — BP 135/76 | HR 92 | Wt 204.0 lb

## 2018-02-17 DIAGNOSIS — O0993 Supervision of high risk pregnancy, unspecified, third trimester: Secondary | ICD-10-CM | POA: Diagnosis not present

## 2018-02-17 DIAGNOSIS — F3163 Bipolar disorder, current episode mixed, severe, without psychotic features: Secondary | ICD-10-CM

## 2018-02-17 DIAGNOSIS — Z3480 Encounter for supervision of other normal pregnancy, unspecified trimester: Secondary | ICD-10-CM

## 2018-02-17 DIAGNOSIS — F314 Bipolar disorder, current episode depressed, severe, without psychotic features: Secondary | ICD-10-CM | POA: Diagnosis not present

## 2018-02-17 DIAGNOSIS — R45851 Suicidal ideations: Secondary | ICD-10-CM | POA: Diagnosis not present

## 2018-02-17 DIAGNOSIS — Z23 Encounter for immunization: Secondary | ICD-10-CM

## 2018-02-17 LAB — POCT URINALYSIS DIP (DEVICE)
Bilirubin Urine: NEGATIVE
Glucose, UA: NEGATIVE mg/dL
Hgb urine dipstick: NEGATIVE
Ketones, ur: NEGATIVE mg/dL
Nitrite: NEGATIVE
Protein, ur: NEGATIVE mg/dL
Specific Gravity, Urine: 1.02 (ref 1.005–1.030)
Urobilinogen, UA: 0.2 mg/dL (ref 0.0–1.0)
pH: 7 (ref 5.0–8.0)

## 2018-02-17 NOTE — Patient Instructions (Signed)
Return to clinic for any scheduled appointments or obstetric concerns, or go to [facility] for evaluation    Third Trimester of Pregnancy The third trimester is from week 28 through week 40 (months 7 through 9). The third trimester is a time when the unborn baby (fetus) is growing rapidly. At the end of the ninth month, the fetus is about 20 inches in length and weighs 6-10 pounds. Body changes during your third trimester Your body will continue to go through many changes during pregnancy. The changes vary from woman to woman. During the third trimester:  Your weight will continue to increase. You can expect to gain 25-35 pounds (11-16 kg) by the end of the pregnancy.  You may begin to get stretch marks on your hips, abdomen, and breasts.  You may urinate more often because the fetus is moving lower into your pelvis and pressing on your bladder.  You may develop or continue to have heartburn. This is caused by increased hormones that slow down muscles in the digestive tract.  You may develop or continue to have constipation because increased hormones slow digestion and cause the muscles that push waste through your intestines to relax.  You may develop hemorrhoids. These are swollen veins (varicose veins) in the rectum that can itch or be painful.  You may develop swollen, bulging veins (varicose veins) in your legs.  You may have increased body aches in the pelvis, back, or thighs. This is due to weight gain and increased hormones that are relaxing your joints.  You may have changes in your hair. These can include thickening of your hair, rapid growth, and changes in texture. Some women also have hair loss during or after pregnancy, or hair that feels dry or thin. Your hair will most likely return to normal after your baby is born.  Your breasts will continue to grow and they will continue to become tender. A yellow fluid (colostrum) may leak from your breasts. This is the first milk you are  producing for your baby.  Your belly button may stick out.  You may notice more swelling in your hands, face, or ankles.  You may have increased tingling or numbness in your hands, arms, and legs. The skin on your belly may also feel numb.  You may feel short of breath because of your expanding uterus.  You may have more problems sleeping. This can be caused by the size of your belly, increased need to urinate, and an increase in your body's metabolism.  You may notice the fetus "dropping," or moving lower in your abdomen (lightening).  You may have increased vaginal discharge.  You may notice your joints feel loose and you may have pain around your pelvic bone. What to expect at prenatal visits You will have prenatal exams every 2 weeks until week 36. Then you will have weekly prenatal exams. During a routine prenatal visit:  You will be weighed to make sure you and the baby are growing normally.  Your blood pressure will be taken.  Your abdomen will be measured to track your baby's growth.  The fetal heartbeat will be listened to.  Any test results from the previous visit will be discussed.  You may have a cervical check near your due date to see if your cervix has softened or thinned (effaced).  You will be tested for Group B streptococcus. This happens between 35 and 37 weeks. Your health care provider may ask you:  What your birth plan is.    How you are feeling.  If you are feeling the baby move.  If you have had any abnormal symptoms, such as leaking fluid, bleeding, severe headaches, or abdominal cramping.  If you are using any tobacco products, including cigarettes, chewing tobacco, and electronic cigarettes.  If you have any questions. Other tests or screenings that may be performed during your third trimester include:  Blood tests that check for low iron levels (anemia).  Fetal testing to check the health, activity level, and growth of the fetus. Testing is  done if you have certain medical conditions or if there are problems during the pregnancy.  Nonstress test (NST). This test checks the health of your baby to make sure there are no signs of problems, such as the baby not getting enough oxygen. During this test, a belt is placed around your belly. The baby is made to move, and its heart rate is monitored during movement. What is false labor? False labor is a condition in which you feel small, irregular tightenings of the muscles in the womb (contractions) that usually go away with rest, changing position, or drinking water. These are called Braxton Hicks contractions. Contractions may last for hours, days, or even weeks before true labor sets in. If contractions come at regular intervals, become more frequent, increase in intensity, or become painful, you should see your health care provider. What are the signs of labor?  Abdominal cramps.  Regular contractions that start at 10 minutes apart and become stronger and more frequent with time.  Contractions that start on the top of the uterus and spread down to the lower abdomen and back.  Increased pelvic pressure and dull back pain.  A watery or bloody mucus discharge that comes from the vagina.  Leaking of amniotic fluid. This is also known as your "water breaking." It could be a slow trickle or a gush. Let your health care provider know if it has a color or strange odor. If you have any of these signs, call your health care provider right away, even if it is before your due date. Follow these instructions at home: Medicines  Follow your health care provider's instructions regarding medicine use. Specific medicines may be either safe or unsafe to take during pregnancy.  Take a prenatal vitamin that contains at least 600 micrograms (mcg) of folic acid.  If you develop constipation, try taking a stool softener if your health care provider approves. Eating and drinking   Eat a balanced diet  that includes fresh fruits and vegetables, whole grains, good sources of protein such as meat, eggs, or tofu, and low-fat dairy. Your health care provider will help you determine the amount of weight gain that is right for you.  Avoid raw meat and uncooked cheese. These carry germs that can cause birth defects in the baby.  If you have low calcium intake from food, talk to your health care provider about whether you should take a daily calcium supplement.  Eat four or five small meals rather than three large meals a day.  Limit foods that are high in fat and processed sugars, such as fried and sweet foods.  To prevent constipation: ? Drink enough fluid to keep your urine clear or pale yellow. ? Eat foods that are high in fiber, such as fresh fruits and vegetables, whole grains, and beans. Activity  Exercise only as directed by your health care provider. Most women can continue their usual exercise routine during pregnancy. Try to exercise for 30 minutes   at least 5 days a week. Stop exercising if you experience uterine contractions.  Avoid heavy lifting.  Do not exercise in extreme heat or humidity, or at high altitudes.  Wear low-heel, comfortable shoes.  Practice good posture.  You may continue to have sex unless your health care provider tells you otherwise. Relieving pain and discomfort  Take frequent breaks and rest with your legs elevated if you have leg cramps or low back pain.  Take warm sitz baths to soothe any pain or discomfort caused by hemorrhoids. Use hemorrhoid cream if your health care provider approves.  Wear a good support bra to prevent discomfort from breast tenderness.  If you develop varicose veins: ? Wear support pantyhose or compression stockings as told by your healthcare provider. ? Elevate your feet for 15 minutes, 3-4 times a day. Prenatal care  Write down your questions. Take them to your prenatal visits.  Keep all your prenatal visits as told by  your health care provider. This is important. Safety  Wear your seat belt at all times when driving.  Make a list of emergency phone numbers, including numbers for family, friends, the hospital, and police and fire departments. General instructions  Avoid cat litter boxes and soil used by cats. These carry germs that can cause birth defects in the baby. If you have a cat, ask someone to clean the litter box for you.  Do not travel far distances unless it is absolutely necessary and only with the approval of your health care provider.  Do not use hot tubs, steam rooms, or saunas.  Do not drink alcohol.  Do not use any products that contain nicotine or tobacco, such as cigarettes and e-cigarettes. If you need help quitting, ask your health care provider.  Do not use any medicinal herbs or unprescribed drugs. These chemicals affect the formation and growth of the baby.  Do not douche or use tampons or scented sanitary pads.  Do not cross your legs for long periods of time.  To prepare for the arrival of your baby: ? Take prenatal classes to understand, practice, and ask questions about labor and delivery. ? Make a trial run to the hospital. ? Visit the hospital and tour the maternity area. ? Arrange for maternity or paternity leave through employers. ? Arrange for family and friends to take care of pets while you are in the hospital. ? Purchase a rear-facing car seat and make sure you know how to install it in your car. ? Pack your hospital bag. ? Prepare the baby's nursery. Make sure to remove all pillows and stuffed animals from the baby's crib to prevent suffocation.  Visit your dentist if you have not gone during your pregnancy. Use a soft toothbrush to brush your teeth and be gentle when you floss. Contact a health care provider if:  You are unsure if you are in labor or if your water has broken.  You become dizzy.  You have mild pelvic cramps, pelvic pressure, or nagging  pain in your abdominal area.  You have lower back pain.  You have persistent nausea, vomiting, or diarrhea.  You have an unusual or bad smelling vaginal discharge.  You have pain when you urinate. Get help right away if:  Your water breaks before 37 weeks.  You have regular contractions less than 5 minutes apart before 37 weeks.  You have a fever.  You are leaking fluid from your vagina.  You have spotting or bleeding from your vagina.    You have severe abdominal pain or cramping.  You have rapid weight loss or weight gain.  You have shortness of breath with chest pain.  You notice sudden or extreme swelling of your face, hands, ankles, feet, or legs.  Your baby makes fewer than 10 movements in 2 hours.  You have severe headaches that do not go away when you take medicine.  You have vision changes. Summary  The third trimester is from week 28 through week 40, months 7 through 9. The third trimester is a time when the unborn baby (fetus) is growing rapidly.  During the third trimester, your discomfort may increase as you and your baby continue to gain weight. You may have abdominal, leg, and back pain, sleeping problems, and an increased need to urinate.  During the third trimester your breasts will keep growing and they will continue to become tender. A yellow fluid (colostrum) may leak from your breasts. This is the first milk you are producing for your baby.  False labor is a condition in which you feel small, irregular tightenings of the muscles in the womb (contractions) that eventually go away. These are called Braxton Hicks contractions. Contractions may last for hours, days, or even weeks before true labor sets in.  Signs of labor can include: abdominal cramps; regular contractions that start at 10 minutes apart and become stronger and more frequent with time; watery or bloody mucus discharge that comes from the vagina; increased pelvic pressure and dull back pain;  and leaking of amniotic fluid. This information is not intended to replace advice given to you by your health care provider. Make sure you discuss any questions you have with your health care provider. Document Released: 01/07/2001 Document Revised: 02/19/2016 Document Reviewed: 02/19/2016 Elsevier Interactive Patient Education  2019 [employer]  

## 2018-02-17 NOTE — BH Specialist Note (Signed)
Integrated Behavioral Health Follow Up Visit  MRN: [id number] Name: [name]  Number of Integrated Behavioral Health Clinician visits: 3/6 Session Start time: 10:01 Session End time: 10:18 Total time: 20 minutes  Type of Service: Integrated Behavioral Health- Individual/Family Interpretor:No. Interpretor Name and Language: n/a  SUBJECTIVE: [name] is a 23 y.o. female accompanied by n/a Patient was referred by Jaynie Collins, MD for SI. Patient reports the following symptoms/concerns: Pt states her primary concern today is recurring SI, and wants reassurance a voluntary commitment is a good choice today.  FOB and family are supportive, and pt agrees that [facility]per in-office call), she agrees to check in today voluntarily. Pt denies intent or plan, but "thoughts come and go".  Duration of problem: Increase in pregnancy; Severity of problem: severe  OBJECTIVE: Mood: Anxious and Depressed and Affect: Depressed Risk of harm to self or others: Suicidal ideation No plan to harm self or others  LIFE CONTEXT: Family and Social: Pt lives with FOB; has a 5yo daughter School/Work: - Self-Care: Pt prioritizes self-care/overall wellbeing Life Changes: Current pregnancy; move from [place] to [place]  GOALS ADDRESSED: Patient will: 1.  Reduce symptoms of: mood instability  2.  Increase knowledge and/or ability of: stress reduction  3.  Demonstrate ability to: Increase motivation to adhere to plan of care  INTERVENTIONS: Interventions utilized:  Solution-Focused Strategies Standardized Assessments completed: Not Needed  ASSESSMENT: Patient currently experiencing Bipolar disorder, currently mixed.   Patient may benefit from referral to psychiatry, and ongoing brief therapeutic interventions regarding coping with mood instability  PLAN: 1. Follow up with behavioral health clinician on : 2 weeks (next medical appointment) 2. Behavioral recommendations:  -Continue  with plans for voluntary commitment in next 24 hours -Continue utilizing self-coping strategies that have worked in the past 3. Referral(s): Integrated [facility] (In Clinic) and [facility] (LME/Outside Clinic) 4. "From scale of 1-10, how likely are you to follow plan?": 10   C , LCSW
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
## 2018-02-19 ENCOUNTER — Other Ambulatory Visit: Payer: Self-pay

## 2018-02-19 DIAGNOSIS — F5105 Insomnia due to other mental disorder: Secondary | ICD-10-CM

## 2018-02-19 DIAGNOSIS — F3163 Bipolar disorder, current episode mixed, severe, without psychotic features: Secondary | ICD-10-CM

## 2018-02-19 DIAGNOSIS — F99 Mental disorder, not otherwise specified: Secondary | ICD-10-CM

## 2018-02-20 LAB — CBC
Hematocrit: 32.4 % — ABNORMAL LOW (ref 34.0–46.6)
Hemoglobin: 11.1 g/dL (ref 11.1–15.9)
MCH: 29.7 pg (ref 26.6–33.0)
MCHC: 34.3 g/dL (ref 31.5–35.7)
MCV: 87 fL (ref 79–97)
Platelets: 289 10*3/uL (ref 150–450)
RBC: 3.74 x10E6/uL — ABNORMAL LOW (ref 3.77–5.28)
RDW: 13.9 % (ref 11.7–15.4)
WBC: 11.4 10*3/uL — ABNORMAL HIGH (ref 3.4–10.8)

## 2018-02-20 LAB — HIV 1/2 AB DIFFERENTIATION
HIV 1 Ab: NEGATIVE
HIV 2 Ab: NEGATIVE
NOTE (HIV CONF MULTIP: NEGATIVE

## 2018-02-20 LAB — GLUCOSE TOLERANCE, 2 HOURS W/ 1HR
GLUCOSE, 1 HOUR: 60 mg/dL — AB (ref 65–179)
Glucose, 2 hour: 82 mg/dL (ref 65–152)
Glucose, Fasting: 73 mg/dL (ref 65–91)

## 2018-02-20 LAB — RNA QUALITATIVE: HIV 1 RNA Qualitative: NEGATIVE

## 2018-02-20 LAB — RPR: RPR Ser Ql: NONREACTIVE

## 2018-02-20 LAB — HIV ANTIBODY (ROUTINE TESTING W REFLEX): HIV SCREEN 4TH GENERATION: REACTIVE — AB

## 2018-02-20 MED ORDER — QUETIAPINE FUMARATE 100 MG PO TABS: 100.0000 mg | ORAL_TABLET | Freq: Every day | ORAL | 0 refills | Status: DC

## 2018-02-22 ENCOUNTER — Other Ambulatory Visit: Payer: Self-pay | Admitting: *Deleted

## 2018-02-22 ENCOUNTER — Encounter: Payer: Self-pay | Admitting: Obstetrics & Gynecology

## 2018-02-22 DIAGNOSIS — Z789 Other specified health status: Secondary | ICD-10-CM | POA: Insufficient documentation

## 2018-02-22 MED ORDER — TERCONAZOLE 0.8 % VA CREA: TOPICAL_CREAM | VAGINAL | 0 refills | Status: DC

## 2018-03-03 ENCOUNTER — Ambulatory Visit (INDEPENDENT_AMBULATORY_CARE_PROVIDER_SITE_OTHER): Payer: Medicaid Other | Admitting: Obstetrics and Gynecology

## 2018-03-03 ENCOUNTER — Other Ambulatory Visit (HOSPITAL_COMMUNITY)
Admission: RE | Admit: 2018-03-03 | Discharge: 2018-03-03 | Disposition: A | Discharge status: Home / Self Care | Payer: Medicaid Other | Source: Ambulatory Visit | Attending: Obstetrics and Gynecology | Admitting: Obstetrics and Gynecology

## 2018-03-03 ENCOUNTER — Ambulatory Visit (INDEPENDENT_AMBULATORY_CARE_PROVIDER_SITE_OTHER): Payer: Medicaid Other | Admitting: Clinical

## 2018-03-03 ENCOUNTER — Encounter: Payer: Self-pay | Admitting: Obstetrics and Gynecology

## 2018-03-03 VITALS — BP 125/82 | HR 81 | Wt 199.0 lb

## 2018-03-03 DIAGNOSIS — Z3483 Encounter for supervision of other normal pregnancy, third trimester: Secondary | ICD-10-CM

## 2018-03-03 DIAGNOSIS — N909 Noninflammatory disorder of vulva and perineum, unspecified: Secondary | ICD-10-CM

## 2018-03-03 DIAGNOSIS — Z8759 Personal history of other complications of pregnancy, childbirth and the puerperium: Secondary | ICD-10-CM

## 2018-03-03 DIAGNOSIS — Z3480 Encounter for supervision of other normal pregnancy, unspecified trimester: Secondary | ICD-10-CM

## 2018-03-03 DIAGNOSIS — F3162 Bipolar disorder, current episode mixed, moderate: Secondary | ICD-10-CM | POA: Diagnosis not present

## 2018-03-03 MED ORDER — ZOLPIDEM TARTRATE 5 MG PO TABS: 5.0000 mg | ORAL_TABLET | Freq: Every evening | ORAL | 1 refills | Status: DC | PRN

## 2018-03-03 NOTE — BH Specialist Note (Signed)
Integrated Behavioral Health Follow Up Visit  MRN: [id number] Name: [name]  Number of Integrated Behavioral Health Clinician visits: 4/6 Session Start time: 1:54  Session End time: 2:14 Total time: 20 minutes  Type of Service: Integrated Behavioral Health- Individual/Family Interpretor:No. Interpretor Name and Language: n/a  SUBJECTIVE: [name] is a 23 y.o. female accompanied by n/a Patient was referred by [name], MD for anxiety today. Patient reports the following symptoms/concerns: Pt states her primary concern today is feeling so anxious as she gets closer to her due date that she has been unable to plan a baby shower for herself, and worries she will not be prepared for baby before birth. Pt also states she did not go to [facility], as her SI stopped. Pt feels that if her sleep improves and she knows her baby shower is happening, this will make her anxiety more manageable.  Duration of problem: Increase in pregnancy; Severity of problem: severe  OBJECTIVE: Mood: Anxious and Affect: Appropriate Risk of harm to self or others: No plan to harm self or others  LIFE CONTEXT: Family and Social: Pt lives with FOB, and has a 5yo daughter School/Work: - Self-Care: Pt advocates well for her own wellbeing Life Changes: Current pregnancy; move from [place] to [place]  GOALS ADDRESSED: Patient will: 1.  Reduce symptoms of: anxiety and mood instability  2.  Increase knowledge and/or ability of: self-management skills and stress reduction  3.  Demonstrate ability to: Increase healthy adjustment to current life circumstances and Increase motivation to adhere to plan of care  INTERVENTIONS: Interventions utilized:  Solution-Focused Strategies Standardized Assessments completed: Patient declined screening  ASSESSMENT: Patient currently experiencing Bipolar disorder, current episode mixed, moderate .   Patient may benefit from brief therapeutic intervention today to help manage  anxiety today.  PLAN: 1. Follow up with behavioral health clinician on : As needed 2. Behavioral recommendations:  -Begin taking Ambien for sleep, as prescribed by medical provider -Send group facebook message to three friends, asking them to take over baby shower planning for the weekend of March 14-15; allow friends to do all the planning (give input only as requested) -Prioritize healthy sleep nightly, for the remainder of the pregnancy -Reestablish care at [facility] asap, prior to birth, for ongoing therapy and BH medication management postpartum 3. Referral(s): Integrated Behavioral Health Services (In Clinic) 4. "From scale of 1-10, how likely are you to follow plan?": -  [name], LCSW  Depression screen [facility] 2/9 01/12/2018 12/15/2017 11/27/2017  Decreased Interest 1 2 2   Down, Depressed, Hopeless 2 3 2   PHQ - 2 Score 3 5 4   Altered sleeping 3 2 3   Tired, decreased energy 3 3 2   Change in appetite 3 2 2   Feeling bad or failure about yourself  2 3 2   Trouble concentrating 3 2 2   Moving slowly or fidgety/restless 0 0 0  Suicidal thoughts 0 0 0  PHQ-9 Score 17 17 15    GAD 7 : Generalized Anxiety Score 01/12/2018 12/15/2017 11/27/2017  Nervous, Anxious, on Edge 3 3 3   Control/stop worrying 3 3 3   Worry too much - different things 3 3 3   Trouble relaxing 2 3 2   Restless 2 1 0  Easily annoyed or irritable 3 3 2   Afraid - awful might happen 2 2 2   Total GAD 7 Score 18 18 15
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
## 2018-03-04 LAB — CERVICOVAGINAL ANCILLARY ONLY: Candida vaginitis: POSITIVE — AB

## 2018-03-04 MED ORDER — TERCONAZOLE 0.8 % VA CREA: 1.0000 | TOPICAL_CREAM | Freq: Every day | VAGINAL | 0 refills | Status: DC

## 2018-03-04 NOTE — Addendum Note (Signed)
Addended by: [name] on: 03/04/2018 03:49 PM   Modules accepted: Orders

## 2018-03-19 ENCOUNTER — Encounter: Payer: Self-pay | Admitting: Family Medicine

## 2018-03-30 ENCOUNTER — Encounter: Payer: Self-pay | Admitting: Family Medicine

## 2018-04-09 ENCOUNTER — Other Ambulatory Visit: Payer: Self-pay

## 2018-04-09 ENCOUNTER — Ambulatory Visit (INDEPENDENT_AMBULATORY_CARE_PROVIDER_SITE_OTHER): Payer: Self-pay | Admitting: Family Medicine

## 2018-04-09 VITALS — BP 130/86 | HR 89 | Wt 218.6 lb

## 2018-04-09 DIAGNOSIS — Z8759 Personal history of other complications of pregnancy, childbirth and the puerperium: Secondary | ICD-10-CM

## 2018-04-09 DIAGNOSIS — O99343 Other mental disorders complicating pregnancy, third trimester: Secondary | ICD-10-CM

## 2018-04-09 DIAGNOSIS — F314 Bipolar disorder, current episode depressed, severe, without psychotic features: Secondary | ICD-10-CM

## 2018-04-09 DIAGNOSIS — Z3480 Encounter for supervision of other normal pregnancy, unspecified trimester: Secondary | ICD-10-CM

## 2018-04-09 DIAGNOSIS — F32A Depression, unspecified: Secondary | ICD-10-CM

## 2018-04-09 DIAGNOSIS — F329 Major depressive disorder, single episode, unspecified: Secondary | ICD-10-CM

## 2018-04-09 DIAGNOSIS — Z3A35 35 weeks gestation of pregnancy: Secondary | ICD-10-CM

## 2018-04-09 NOTE — Patient Instructions (Signed)

## 2018-04-09 NOTE — Progress Notes (Signed)
Pt reports spotting last week but denies spotting now.  Pt reports contractions that start in the front of her abdomen and go to her back that come every 15 minutes and started this morning. Pt requests to have her cervix checked.
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
## 2018-04-14 ENCOUNTER — Other Ambulatory Visit: Payer: Self-pay

## 2018-04-14 MED ORDER — FLUCONAZOLE 150 MG PO TABS: 150.0000 mg | ORAL_TABLET | Freq: Once | ORAL | 1 refills | Status: AC

## 2018-04-14 NOTE — Progress Notes (Signed)
Pt requested to have Diflucan for tx of yeast infection due to the creams not working.  Dr. Macon Large permitted for pt to have Diflucan ordered.

## 2018-04-19 ENCOUNTER — Other Ambulatory Visit: Payer: Self-pay

## 2018-04-19 ENCOUNTER — Other Ambulatory Visit (HOSPITAL_COMMUNITY)
Admission: RE | Admit: 2018-04-19 | Discharge: 2018-04-19 | Disposition: A | Discharge status: Home / Self Care | Payer: Medicaid Other | Source: Ambulatory Visit | Attending: Family Medicine | Admitting: Family Medicine

## 2018-04-19 ENCOUNTER — Ambulatory Visit (INDEPENDENT_AMBULATORY_CARE_PROVIDER_SITE_OTHER): Payer: Medicaid Other | Admitting: Family Medicine

## 2018-04-19 VITALS — BP 118/71 | HR 114 | Temp 98.6°F | Wt 218.0 lb

## 2018-04-19 DIAGNOSIS — Z8759 Personal history of other complications of pregnancy, childbirth and the puerperium: Secondary | ICD-10-CM

## 2018-04-19 DIAGNOSIS — Z3480 Encounter for supervision of other normal pregnancy, unspecified trimester: Secondary | ICD-10-CM

## 2018-04-19 DIAGNOSIS — O99343 Other mental disorders complicating pregnancy, third trimester: Secondary | ICD-10-CM

## 2018-04-19 DIAGNOSIS — Z3A37 37 weeks gestation of pregnancy: Secondary | ICD-10-CM

## 2018-04-19 DIAGNOSIS — F314 Bipolar disorder, current episode depressed, severe, without psychotic features: Secondary | ICD-10-CM
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
## 2018-04-20 LAB — GC/CHLAMYDIA PROBE AMP (~~LOC~~) NOT AT ARMC
Chlamydia: NEGATIVE
NEISSERIA GONORRHEA: NEGATIVE

## 2018-04-23 LAB — STREP GP B CULTURE+RFLX: Strep Gp B Culture+Rflx: NEGATIVE

## 2018-04-26 ENCOUNTER — Encounter: Payer: Self-pay | Admitting: Obstetrics and Gynecology

## 2018-04-26 ENCOUNTER — Telehealth: Payer: Self-pay | Admitting: *Deleted

## 2018-04-26 NOTE — Telephone Encounter (Signed)
[name] sent a MyChart message c/o bad headache. I called [name]. We discussed since she is [redacted] weeks pregnant and hx GHTN . She states she is still  Having headache not quite as bad but unrelieved by tylenol. She denies edema. I advised her to go to MAU for evaluation for [facility] or preeclampsia asap. She voices understanding.

## 2018-04-27 ENCOUNTER — Encounter (HOSPITAL_COMMUNITY): Payer: Self-pay | Admitting: *Deleted

## 2018-04-27 ENCOUNTER — Inpatient Hospital Stay (HOSPITAL_COMMUNITY)
Admission: AD | Admit: 2018-04-27 | Discharge: 2018-04-30 | DRG: 806 | Disposition: A | Discharge status: Home / Self Care | Payer: Medicaid Other | Attending: Obstetrics & Gynecology | Admitting: Obstetrics & Gynecology

## 2018-04-27 DIAGNOSIS — F17210 Nicotine dependence, cigarettes, uncomplicated: Secondary | ICD-10-CM | POA: Diagnosis present

## 2018-04-27 DIAGNOSIS — O1414 Severe pre-eclampsia complicating childbirth: Principal | ICD-10-CM | POA: Diagnosis present

## 2018-04-27 DIAGNOSIS — Z3480 Encounter for supervision of other normal pregnancy, unspecified trimester: Secondary | ICD-10-CM

## 2018-04-27 DIAGNOSIS — F329 Major depressive disorder, single episode, unspecified: Secondary | ICD-10-CM | POA: Diagnosis present

## 2018-04-27 DIAGNOSIS — F17200 Nicotine dependence, unspecified, uncomplicated: Secondary | ICD-10-CM | POA: Diagnosis present

## 2018-04-27 DIAGNOSIS — Z789 Other specified health status: Secondary | ICD-10-CM

## 2018-04-27 DIAGNOSIS — F314 Bipolar disorder, current episode depressed, severe, without psychotic features: Secondary | ICD-10-CM | POA: Diagnosis present

## 2018-04-27 DIAGNOSIS — O99344 Other mental disorders complicating childbirth: Secondary | ICD-10-CM | POA: Diagnosis present

## 2018-04-27 DIAGNOSIS — Z3A38 38 weeks gestation of pregnancy: Secondary | ICD-10-CM

## 2018-04-27 DIAGNOSIS — Z880 Allergy status to penicillin: Secondary | ICD-10-CM

## 2018-04-27 DIAGNOSIS — Z8759 Personal history of other complications of pregnancy, childbirth and the puerperium: Secondary | ICD-10-CM

## 2018-04-27 DIAGNOSIS — F4310 Post-traumatic stress disorder, unspecified: Secondary | ICD-10-CM | POA: Diagnosis present

## 2018-04-27 DIAGNOSIS — O133 Gestational [pregnancy-induced] hypertension without significant proteinuria, third trimester: Secondary | ICD-10-CM

## 2018-04-27 DIAGNOSIS — O99334 Smoking (tobacco) complicating childbirth: Secondary | ICD-10-CM | POA: Diagnosis present

## 2018-04-27 DIAGNOSIS — F32A Depression, unspecified: Secondary | ICD-10-CM | POA: Diagnosis present

## 2018-04-27 DIAGNOSIS — F419 Anxiety disorder, unspecified: Secondary | ICD-10-CM | POA: Diagnosis present

## 2018-04-27 DIAGNOSIS — O139 Gestational [pregnancy-induced] hypertension without significant proteinuria, unspecified trimester: Secondary | ICD-10-CM | POA: Diagnosis present

## 2018-04-27 DIAGNOSIS — O1410 Severe pre-eclampsia, unspecified trimester: Secondary | ICD-10-CM | POA: Diagnosis not present

## 2018-04-27 DIAGNOSIS — O2341 Unspecified infection of urinary tract in pregnancy, first trimester: Secondary | ICD-10-CM

## 2018-04-27 HISTORY — DX: Bipolar disorder, unspecified: F31.9

## 2018-04-27 HISTORY — DX: Borderline personality disorder: F60.3

## 2018-04-27 LAB — COMPREHENSIVE METABOLIC PANEL
ALT: 18 U/L (ref 0–44)
AST: 18 U/L (ref 15–41)
Albumin: 2.8 g/dL — ABNORMAL LOW (ref 3.5–5.0)
Alkaline Phosphatase: 123 U/L (ref 38–126)
Anion gap: 11 (ref 5–15)
BUN: 10 mg/dL (ref 6–20)
CHLORIDE: 103 mmol/L (ref 98–111)
CO2: 20 mmol/L — ABNORMAL LOW (ref 22–32)
Calcium: 8.9 mg/dL (ref 8.9–10.3)
Creatinine, Ser: 0.51 mg/dL (ref 0.44–1.00)
GFR calc Af Amer: >60 mL/min (ref >60)
GFR calc non Af Amer: >60 mL/min (ref >60)
Glucose, Bld: 90 mg/dL (ref 70–99)
POTASSIUM: 3.7 mmol/L (ref 3.5–5.1)
SODIUM: 134 mmol/L — AB (ref 135–145)
Total Bilirubin: 0.4 mg/dL (ref 0.3–1.2)
Total Protein: 6.7 g/dL (ref 6.5–8.1)

## 2018-04-27 LAB — CBC
HCT: 38.4 % (ref 36.0–46.0)
Hemoglobin: 12.1 g/dL (ref 12.0–15.0)
MCH: 26.9 pg (ref 26.0–34.0)
MCHC: 31.5 g/dL (ref 30.0–36.0)
MCV: 85.5 fL (ref 80.0–100.0)
Platelets: 271 10*3/uL (ref 150–400)
RBC: 4.49 MIL/uL (ref 3.87–5.11)
RDW: 15 % (ref 11.5–15.5)
WBC: 9.9 10*3/uL (ref 4.0–10.5)
nRBC: 0 % (ref 0.0–0.2)

## 2018-04-27 LAB — TYPE AND SCREEN
ABO/RH(D): O POS
Antibody Screen: NEGATIVE

## 2018-04-27 LAB — URINALYSIS, ROUTINE W REFLEX MICROSCOPIC
Bilirubin Urine: NEGATIVE
GLUCOSE, UA: NEGATIVE mg/dL
Hgb urine dipstick: NEGATIVE
Ketones, ur: NEGATIVE mg/dL
Nitrite: NEGATIVE
Protein, ur: NEGATIVE mg/dL
Specific Gravity, Urine: 1.012 (ref 1.005–1.030)
pH: 6 (ref 5.0–8.0)

## 2018-04-27 LAB — PROTEIN / CREATININE RATIO, URINE
Creatinine, Urine: 66.69 mg/dL
Protein Creatinine Ratio: 0.24 mg/mg{Cre} — ABNORMAL HIGH (ref 0.00–0.15)
Total Protein, Urine: 16 mg/dL

## 2018-04-27 MED ORDER — METOCLOPRAMIDE HCL 5 MG/ML IJ SOLN: 10.0000 mg | Freq: Once | INTRAMUSCULAR | Status: DC

## 2018-04-27 MED ORDER — BUTALBITAL-APAP-CAFFEINE 50-325-40 MG PO TABS
2.0000 | ORAL_TABLET | Freq: Once | ORAL | Status: AC
  Administered 2018-04-27: 2 via ORAL
  Filled 2018-04-27: qty 2

## 2018-04-27 MED ORDER — DEXAMETHASONE SODIUM PHOSPHATE 10 MG/ML IJ SOLN: 10.0000 mg | Freq: Once | INTRAMUSCULAR | Status: DC

## 2018-04-27 MED ORDER — LACTATED RINGERS IV SOLN: INTRAVENOUS | Status: DC

## 2018-04-27 NOTE — MAU Note (Addendum)
Pt reports to MAU c/o a constant HA. Pt states with the onset of her HA's she has experienced blurry vision. No RUQ pain. Pt reports some SOB that is a new symptom x3 days ago. Pt reports +FM. Irregular ctxs. No bleeding or LOF. Pt states tylenol is not working for her HA and she has a hx of Gestational Hypertension. Pt denies chest pain.

## 2018-04-27 NOTE — MAU Provider Note (Signed)
Chief Complaint:  Headache   First Provider Initiated Contact with Patient 04/27/18 2048      HPI: [name] is a 23 y.o. G2P1001 at 64w2dwho presents to maternity admissions reporting headache and blurred vision "for several days", unresponsive to Tylenol.  No hx of migraines.  Also has shortness of breath.  States they have been watching her BPs in office.   Hx Gestational Htn.. She reports good fetal movement, denies LOF, vaginal bleeding, vaginal itching/burning, urinary symptoms, dizziness, n/v, diarrhea, constipation or fever/chills.  She denies RUQ abdominal pain.  Tearful, nervous. First baby placed for adoption so this will be her first baby she has taken home.   Past Medical History: Past Medical History:  Diagnosis Date  . Allergic conjunctivitis   . Allergy   . Anxiety 06/04/2011   panic attacks  . Astigmatism    glasses  . Bipolar 1 disorder (HCC)   . Borderline personality disorder (HCC)   . Depression   . Obesity   . Post traumatic stress disorder   . Urinary tract infection Nov 05, 1993   recurrent, nl [facility], [facility])    Past obstetric history: OB History  Gravida Para Term Preterm AB Living  2 1 1  0 0 1  SAB TAB Ectopic Multiple Live Births  0 0 0 0 1    # Outcome Date GA Lbr Len/2nd Weight Sex Delivery Anes PTL Lv  2 Current           1 Term             Obstetric Comments  G1- [facility], lives in [place]    Past Surgical History: Past Surgical History:  Procedure Laterality Date  . INGUINAL HERNIA REPAIR  7/95   bilat exploration, left repair  . UMBILICAL HERNIA REPAIR  7/95   repaired as infant    Family History: Family History  Problem Relation Age of Onset  . Hypertension Mother   . Hypothyroidism Mother   . Hypertension Father   . Brain cancer Maternal Grandfather   . Cancer Other     Social History: Social History   Tobacco Use  . Smoking status: Former Smoker    Packs/day: 1.00    Types: Cigarettes  . Smokeless tobacco: Never  Used  Substance Use Topics  . Alcohol use: No  . Drug use: No    Allergies:  Allergies  Allergen Reactions  . Penicillins Anaphylaxis and Hives    Meds:  Medications Prior to Admission  Medication Sig Dispense Refill Last Dose  . acetaminophen (TYLENOL) 500 MG tablet Take 1,000 mg by mouth every 6 (six) hours as needed.   04/26/2018 at Unknown time  . zolpidem (AMBIEN) 5 MG tablet Take 1 tablet (5 mg total) by mouth at bedtime as needed for sleep. 30 tablet 1 04/26/2018 at Unknown time  . Prenatal Vit-Fe Fumarate-FA (PRENATAL MULTIVITAMIN) TABS tablet Take 1 tablet by mouth daily at 12 noon.   More than a month at Unknown time  . sertraline (ZOLOFT) 50 MG tablet Take 1 tablet (50 mg total) by mouth daily. (Patient not taking: Reported on 04/09/2018) 30 tablet 2 Not Taking    I have reviewed patient's Past Medical Hx, Surgical Hx, Family Hx, Social Hx, medications and allergies.   ROS:  Review of Systems  Constitutional: Negative for chills and fever.  Eyes: Positive for visual disturbance.  Respiratory: Positive for shortness of breath. Negative for cough and chest tightness.   Gastrointestinal: Negative for abdominal pain, constipation,  diarrhea and nausea.  Genitourinary: Negative for dysuria and vaginal bleeding.   Other systems negative  Physical Exam   Patient Vitals for the past 24 hrs:  BP Temp Temp src Pulse Resp Height Weight  04/27/18 2012 (!) 149/85 98.5 F (36.9 C) Oral (!) 114 15 - -  04/27/18 2007 - - - - -  (1.676 m) 101 kg   Constitutional: Well-developed, well-nourished female in no acute distress.  Cardiovascular: normal rate and rhythm Respiratory: normal effort, clear to auscultation bilaterally GI: Abd soft, non-tender, gravid appropriate for gestational age.   No rebound or guarding. MS: Extremities nontender, no edema, normal ROM Neurologic: Alert and oriented x 4. DTRs brisk 3+, no clonus GU: Neg CVAT.  PELVIC EXAM:   Dilation:  Fingertip Effacement (%): 40 Exam by:: Wynelle Bourgeois CNM    FHT:  Baseline 140 , moderate variability, accelerations present, no decelerations Contractions: Rare   Labs: Results for orders placed or performed during the hospital encounter of 04/27/18 (from the past 24 hour(s))  Protein / creatinine ratio, urine     Status: Abnormal   Collection Time: 04/27/18  8:22 PM  Result Value Ref Range   Creatinine, Urine 66.69 mg/dL   Total Protein, Urine 16 mg/dL   Protein Creatinine Ratio 0.24 (H) 0.00 - 0.15 mg/mg[Cre]  Urinalysis, Routine w reflex microscopic     Status: Abnormal   Collection Time: 04/27/18  8:22 PM  Result Value Ref Range   Color, Urine YELLOW YELLOW   APPearance CLOUDY (A) CLEAR   Specific Gravity, Urine 1.012 1.005 - 1.030   pH 6.0 5.0 - 8.0   Glucose, UA NEGATIVE NEGATIVE mg/dL   Hgb urine dipstick NEGATIVE NEGATIVE   Bilirubin Urine NEGATIVE NEGATIVE   Ketones, ur NEGATIVE NEGATIVE mg/dL   Protein, ur NEGATIVE NEGATIVE mg/dL   Nitrite NEGATIVE NEGATIVE   Leukocytes,Ua LARGE (A) NEGATIVE   RBC / HPF 0-5 0 - 5 RBC/hpf   WBC, UA 11-20 0 - 5 WBC/hpf   Bacteria, UA MANY (A) NONE SEEN   Squamous Epithelial / LPF 6-10 0 - 5  CBC     Status: None   Collection Time: 04/27/18  8:28 PM  Result Value Ref Range   WBC 9.9 4.0 - 10.5 K/uL   RBC 4.49 3.87 - 5.11 MIL/uL   Hemoglobin 12.1 12.0 - 15.0 g/dL   HCT 40.9 81.1 - 91.4 %   MCV 85.5 80.0 - 100.0 fL   MCH 26.9 26.0 - 34.0 pg   MCHC 31.5 30.0 - 36.0 g/dL   RDW 78.2 95.6 - 21.3 %   Platelets 271 150 - 400 K/uL   nRBC 0.0 0.0 - 0.2 %  Comprehensive metabolic panel     Status: Abnormal   Collection Time: 04/27/18  8:28 PM  Result Value Ref Range   Sodium 134 (L) 135 - 145 mmol/L   Potassium 3.7 3.5 - 5.1 mmol/L   Chloride 103 98 - 111 mmol/L   CO2 20 (L) 22 - 32 mmol/L   Glucose, Bld 90 70 - 99 mg/dL   BUN 10 6 - 20 mg/dL   Creatinine, Ser 0.86 0.44 - 1.00 mg/dL   Calcium 8.9 8.9 - 57.8 mg/dL   Total  Protein 6.7 6.5 - 8.1 g/dL   Albumin 2.8 (L) 3.5 - 5.0 g/dL   AST 18 15 - 41 U/L   ALT 18 0 - 44 U/L   Alkaline Phosphatase 123 38 - 126 U/L   Total  Bilirubin 0.4 0.3 - 1.2 mg/dL   GFR calc non Af Amer >60 >60 mL/min   GFR calc Af Amer >60 >60 mL/min   Anion gap 11 5 - 15  Type and screen     Status: None   Collection Time: 04/27/18  8:28 PM  Result Value Ref Range   ABO/RH(D) O POS    Antibody Screen NEG    Sample Expiration      04/30/2018 Performed at [facility], [address], [place], [place] [address]   ABO/Rh     Status: None (Preliminary result)   Collection Time: 04/27/18  8:28 PM  Result Value Ref Range   ABO/RH(D)      O POS Performed at [facility], [address], [place], [place] [address]     Imaging:    MAU Course/MDM: I have ordered labs and reviewed results. Labs are normal NST reviewed, reactive Headache mostly relieved with Fioricet.  She told me it went from a 9 to a 5, but told RN it was totally relieved.     IV started for hydration and Reglan/Decadron ordered Consult Dr Vergie Living with presentation, exam findings and test results.  Treatments in MAU included EFM, Fioricet.    Assessment: Single intrauterine pregnnacy at [redacted]w[redacted]d Gestational hypertension Headache, tension vs severe feature  Plan: Admit to Labor and Delivery Routine orders Cytotec for ripening Anticipate SVD  Social Work consult   Wynelle Bourgeois CNM, MSN Certified Nurse-Midwife 04/27/2018 8:48 PM

## 2018-04-28 ENCOUNTER — Inpatient Hospital Stay (HOSPITAL_COMMUNITY): Payer: Medicaid Other | Admitting: Anesthesiology

## 2018-04-28 ENCOUNTER — Encounter (HOSPITAL_COMMUNITY): Payer: Self-pay | Admitting: *Deleted

## 2018-04-28 ENCOUNTER — Other Ambulatory Visit: Payer: Self-pay

## 2018-04-28 DIAGNOSIS — F314 Bipolar disorder, current episode depressed, severe, without psychotic features: Secondary | ICD-10-CM | POA: Diagnosis present

## 2018-04-28 DIAGNOSIS — R51 Headache: Secondary | ICD-10-CM | POA: Diagnosis present

## 2018-04-28 DIAGNOSIS — O99334 Smoking (tobacco) complicating childbirth: Secondary | ICD-10-CM | POA: Diagnosis present

## 2018-04-28 DIAGNOSIS — F4310 Post-traumatic stress disorder, unspecified: Secondary | ICD-10-CM | POA: Diagnosis present

## 2018-04-28 DIAGNOSIS — Z880 Allergy status to penicillin: Secondary | ICD-10-CM | POA: Diagnosis not present

## 2018-04-28 DIAGNOSIS — Z3A38 38 weeks gestation of pregnancy: Secondary | ICD-10-CM

## 2018-04-28 DIAGNOSIS — O1410 Severe pre-eclampsia, unspecified trimester: Secondary | ICD-10-CM | POA: Diagnosis not present

## 2018-04-28 DIAGNOSIS — O99344 Other mental disorders complicating childbirth: Secondary | ICD-10-CM | POA: Diagnosis present

## 2018-04-28 DIAGNOSIS — O1414 Severe pre-eclampsia complicating childbirth: Secondary | ICD-10-CM | POA: Diagnosis present

## 2018-04-28 DIAGNOSIS — O139 Gestational [pregnancy-induced] hypertension without significant proteinuria, unspecified trimester: Secondary | ICD-10-CM | POA: Diagnosis present

## 2018-04-28 DIAGNOSIS — F17210 Nicotine dependence, cigarettes, uncomplicated: Secondary | ICD-10-CM | POA: Diagnosis present

## 2018-04-28 LAB — CBC
HCT: 36.3 % (ref 36.0–46.0)
HCT: 34.7 % — ABNORMAL LOW (ref 36.0–46.0)
Hemoglobin: 11.4 g/dL — ABNORMAL LOW (ref 12.0–15.0)
Hemoglobin: 11.1 g/dL — ABNORMAL LOW (ref 12.0–15.0)
MCH: 26.8 pg (ref 26.0–34.0)
MCH: 27.6 pg (ref 26.0–34.0)
MCHC: 31.4 g/dL (ref 30.0–36.0)
MCHC: 32 g/dL (ref 30.0–36.0)
MCV: 85.2 fL (ref 80.0–100.0)
MCV: 86.3 fL (ref 80.0–100.0)
Platelets: 272 10*3/uL (ref 150–400)
Platelets: 265 10*3/uL (ref 150–400)
RBC: 4.26 MIL/uL (ref 3.87–5.11)
RBC: 4.02 MIL/uL (ref 3.87–5.11)
RDW: 15.3 % (ref 11.5–15.5)
RDW: 15.1 % (ref 11.5–15.5)
WBC: 10.8 10*3/uL — ABNORMAL HIGH (ref 4.0–10.5)
WBC: 14 10*3/uL — ABNORMAL HIGH (ref 4.0–10.5)
nRBC: 0 % (ref 0.0–0.2)
nRBC: 0 % (ref 0.0–0.2)

## 2018-04-28 LAB — ABO/RH: ABO/RH(D): O POS

## 2018-04-28 MED ORDER — DIPHENHYDRAMINE HCL 50 MG/ML IJ SOLN
12.5000 mg | INTRAMUSCULAR | Status: DC | PRN
  Administered 2018-04-28: 12.5 mg via INTRAVENOUS
  Filled 2018-04-28: qty 1

## 2018-04-28 MED ORDER — EPHEDRINE 5 MG/ML INJ
10.0000 mg | INTRAVENOUS | Status: DC | PRN
  Filled 2018-04-28: qty 2

## 2018-04-28 MED ORDER — ZOLPIDEM TARTRATE 5 MG PO TABS
5.0000 mg | ORAL_TABLET | Freq: Every evening | ORAL | Status: DC | PRN
  Administered 2018-04-29: 5 mg via ORAL
  Filled 2018-04-28: qty 1

## 2018-04-28 MED ORDER — BENZOCAINE-MENTHOL 20-0.5 % EX AERO
1.0000 "application " | INHALATION_SPRAY | CUTANEOUS | Status: DC | PRN
  Administered 2018-04-29: 1 via TOPICAL
  Filled 2018-04-28: qty 56

## 2018-04-28 MED ORDER — OXYTOCIN BOLUS FROM INFUSION
500.0000 mL | Freq: Once | INTRAVENOUS | Status: AC
  Administered 2018-04-28: 500 mL via INTRAVENOUS

## 2018-04-28 MED ORDER — OXYTOCIN 40 UNITS IN NORMAL SALINE INFUSION - SIMPLE MED
2.5000 [IU]/h | INTRAVENOUS | Status: DC
  Filled 2018-04-28: qty 1000

## 2018-04-28 MED ORDER — LIDOCAINE-EPINEPHRINE (PF) 2 %-1:200000 IJ SOLN
INTRAMUSCULAR | Status: DC | PRN
  Administered 2018-04-28: 5 mL via EPIDURAL
  Administered 2018-04-28: 2 mL via EPIDURAL

## 2018-04-28 MED ORDER — PHENYLEPHRINE 40 MCG/ML (10ML) SYRINGE FOR IV PUSH (FOR BLOOD PRESSURE SUPPORT)
80.0000 ug | PREFILLED_SYRINGE | INTRAVENOUS | Status: DC | PRN
  Filled 2018-04-28: qty 10

## 2018-04-28 MED ORDER — OXYCODONE-ACETAMINOPHEN 5-325 MG PO TABS: 2.0000 | ORAL_TABLET | ORAL | Status: DC | PRN

## 2018-04-28 MED ORDER — LIDOCAINE HCL (PF) 1 % IJ SOLN: 30.0000 mL | INTRAMUSCULAR | Status: DC | PRN

## 2018-04-28 MED ORDER — SENNOSIDES-DOCUSATE SODIUM 8.6-50 MG PO TABS
2.0000 | ORAL_TABLET | ORAL | Status: DC
  Administered 2018-04-29 – 2018-04-30 (×2): 2 via ORAL
  Filled 2018-04-28: qty 2

## 2018-04-28 MED ORDER — MAGNESIUM SULFATE 40 G IN LACTATED RINGERS - SIMPLE
2.0000 g/h | INTRAVENOUS | Status: DC
  Administered 2018-04-29: 2 g/h via INTRAVENOUS
  Filled 2018-04-28: qty 500

## 2018-04-28 MED ORDER — MEASLES, MUMPS & RUBELLA VAC IJ SOLR: 0.5000 mL | Freq: Once | INTRAMUSCULAR | Status: DC

## 2018-04-28 MED ORDER — LACTATED RINGERS IV SOLN: 500.0000 mL | INTRAVENOUS | Status: DC | PRN

## 2018-04-28 MED ORDER — MAGNESIUM SULFATE BOLUS VIA INFUSION
4.0000 g | Freq: Once | INTRAVENOUS | Status: AC
  Administered 2018-04-28: 18:00:00 4 g via INTRAVENOUS
  Filled 2018-04-28: qty 500

## 2018-04-28 MED ORDER — WITCH HAZEL-GLYCERIN EX PADS: 1.0000 "application " | MEDICATED_PAD | CUTANEOUS | Status: DC | PRN

## 2018-04-28 MED ORDER — ACETAMINOPHEN 325 MG PO TABS
650.0000 mg | ORAL_TABLET | ORAL | Status: DC | PRN
  Filled 2018-04-28: qty 2

## 2018-04-28 MED ORDER — ONDANSETRON HCL 4 MG/2ML IJ SOLN: 4.0000 mg | Freq: Four times a day (QID) | INTRAMUSCULAR | Status: DC | PRN

## 2018-04-28 MED ORDER — ZOLPIDEM TARTRATE 5 MG PO TABS
5.0000 mg | ORAL_TABLET | Freq: Once | ORAL | Status: AC
  Administered 2018-04-28: 5 mg via ORAL
  Filled 2018-04-28: qty 1

## 2018-04-28 MED ORDER — FENTANYL-BUPIVACAINE-NACL 0.5-0.125-0.9 MG/250ML-% EP SOLN
12.0000 mL/h | EPIDURAL | Status: DC | PRN
  Filled 2018-04-28: qty 250

## 2018-04-28 MED ORDER — PRENATAL MULTIVITAMIN CH
1.0000 | ORAL_TABLET | Freq: Every day | ORAL | Status: DC
  Administered 2018-04-29 – 2018-04-30 (×2): 1 via ORAL
  Filled 2018-04-28 (×2): qty 1

## 2018-04-28 MED ORDER — SIMETHICONE 80 MG PO CHEW: 80.0000 mg | CHEWABLE_TABLET | ORAL | Status: DC | PRN

## 2018-04-28 MED ORDER — TERBUTALINE SULFATE 1 MG/ML IJ SOLN: 0.2500 mg | Freq: Once | INTRAMUSCULAR | Status: DC | PRN

## 2018-04-28 MED ORDER — LACTATED RINGERS IV SOLN
500.0000 mL | Freq: Once | INTRAVENOUS | Status: AC
  Administered 2018-04-28: 500 mL via INTRAVENOUS

## 2018-04-28 MED ORDER — SODIUM CHLORIDE (PF) 0.9 % IJ SOLN
INTRAMUSCULAR | Status: DC | PRN
  Administered 2018-04-28: 12 mL/h via EPIDURAL

## 2018-04-28 MED ORDER — TETANUS-DIPHTH-ACELL PERTUSSIS 5-2.5-18.5 LF-MCG/0.5 IM SUSP: 0.5000 mL | Freq: Once | INTRAMUSCULAR | Status: DC

## 2018-04-28 MED ORDER — ACETAMINOPHEN 325 MG PO TABS
650.0000 mg | ORAL_TABLET | ORAL | Status: DC | PRN
  Administered 2018-04-29: 08:00:00 650 mg via ORAL
  Filled 2018-04-28: qty 2

## 2018-04-28 MED ORDER — ONDANSETRON HCL 4 MG PO TABS: 4.0000 mg | ORAL_TABLET | ORAL | Status: DC | PRN

## 2018-04-28 MED ORDER — IBUPROFEN 600 MG PO TABS
600.0000 mg | ORAL_TABLET | Freq: Four times a day (QID) | ORAL | Status: DC
  Administered 2018-04-29 – 2018-04-30 (×7): 600 mg via ORAL
  Filled 2018-04-28 (×7): qty 1

## 2018-04-28 MED ORDER — METOCLOPRAMIDE HCL 5 MG/ML IJ SOLN
10.0000 mg | Freq: Once | INTRAMUSCULAR | Status: AC
  Administered 2018-04-28: 07:00:00 10 mg via INTRAVENOUS
  Filled 2018-04-28: qty 2

## 2018-04-28 MED ORDER — MAGNESIUM SULFATE 40 G IN LACTATED RINGERS - SIMPLE
2.0000 g/h | INTRAVENOUS | Status: DC
  Administered 2018-04-28: 18:00:00 2 g/h via INTRAVENOUS
  Filled 2018-04-28: qty 500

## 2018-04-28 MED ORDER — COCONUT OIL OIL: 1.0000 "application " | TOPICAL_OIL | Status: DC | PRN

## 2018-04-28 MED ORDER — DEXAMETHASONE SODIUM PHOSPHATE 10 MG/ML IJ SOLN
10.0000 mg | Freq: Once | INTRAMUSCULAR | Status: AC
  Administered 2018-04-28: 07:00:00 10 mg via INTRAVENOUS
  Filled 2018-04-28 (×2): qty 1

## 2018-04-28 MED ORDER — MISOPROSTOL 50MCG HALF TABLET
50.0000 ug | ORAL_TABLET | ORAL | Status: DC
  Administered 2018-04-28 (×2): 50 ug via ORAL
  Filled 2018-04-28 (×2): qty 1

## 2018-04-28 MED ORDER — SOD CITRATE-CITRIC ACID 500-334 MG/5ML PO SOLN: 30.0000 mL | ORAL | Status: DC | PRN

## 2018-04-28 MED ORDER — OXYCODONE-ACETAMINOPHEN 5-325 MG PO TABS: 1.0000 | ORAL_TABLET | ORAL | Status: DC | PRN

## 2018-04-28 MED ORDER — DIPHENHYDRAMINE HCL 25 MG PO CAPS: 25.0000 mg | ORAL_CAPSULE | Freq: Four times a day (QID) | ORAL | Status: DC | PRN

## 2018-04-28 MED ORDER — OXYTOCIN 40 UNITS IN NORMAL SALINE INFUSION - SIMPLE MED
1.0000 m[IU]/min | INTRAVENOUS | Status: DC
  Administered 2018-04-28: 2 m[IU]/min via INTRAVENOUS

## 2018-04-28 MED ORDER — ONDANSETRON HCL 4 MG/2ML IJ SOLN: 4.0000 mg | INTRAMUSCULAR | Status: DC | PRN

## 2018-04-28 MED ORDER — DIBUCAINE 1 % RE OINT: 1.0000 "application " | TOPICAL_OINTMENT | RECTAL | Status: DC | PRN

## 2018-04-28 MED ORDER — LACTATED RINGERS IV SOLN
INTRAVENOUS | Status: DC
  Administered 2018-04-28 (×2): via INTRAVENOUS

## 2018-04-28 NOTE — Anesthesia Preprocedure Evaluation (Signed)
Anesthesia Evaluation  Patient identified by MRN, date of birth, ID band Patient awake    Reviewed: Allergy & Precautions, H&P , NPO status , Patient's Chart, lab work & pertinent test results  History of Anesthesia Complications Negative for: history of anesthetic complications  Airway Mallampati: II  TM Distance: >3 FB Neck ROM: full    Dental no notable dental hx.    Pulmonary neg pulmonary ROS, former smoker,    Pulmonary exam normal        Cardiovascular negative cardio ROS Normal cardiovascular exam Rhythm:regular Rate:Normal     Neuro/Psych PSYCHIATRIC DISORDERS Anxiety Depression Bipolar Disorder negative neurological ROS     GI/Hepatic negative GI ROS, Neg liver ROS,   Endo/Other  negative endocrine ROS  Renal/GU negative Renal ROS  negative genitourinary   Musculoskeletal   Abdominal   Peds  Hematology negative hematology ROS (+)   Anesthesia Other Findings   Reproductive/Obstetrics (+) Pregnancy                             Anesthesia Physical Anesthesia Plan  ASA: II  Anesthesia Plan: Epidural   Post-op Pain Management:    Induction:   PONV Risk Score and Plan:   Airway Management Planned:   Additional Equipment:   Intra-op Plan:   Post-operative Plan:   Informed Consent: I have reviewed the patients History and Physical, chart, labs and discussed the procedure including the risks, benefits and alternatives for the proposed anesthesia with the patient or authorized representative who has indicated his/her understanding and acceptance.       Plan Discussed with:   Anesthesia Plan Comments:         Anesthesia Quick Evaluation

## 2018-04-28 NOTE — Progress Notes (Addendum)
[name] is a 23 y.o. G2P1001 at [redacted]w[redacted]d admitted for induction of labor due to Hypertension.  Subjective:  Has epidural now. Reports that she is comfortable. Patient reports left unilateral headache. She has had headache consistently since last night. Improved only slightly with Fioricet, and then improved to 2/10 with decadron/reglan. Now it has returned.   Objective: BP 116/89   Pulse 88   Temp 97.9 F (36.6 C) (Oral)   Resp 20   Ht 5\' 6"  (1.676 m)   Wt 101 kg   LMP 08/02/2017 (Exact Date)   SpO2 97%   BMI 35.93 kg/m  I/O last 3 completed shifts: In: 247.4 [I.V.:247.4] Out: -  Total I/O In: 3018.2 [P.O.:1000; I.V.:2018.2] Out: -   FHT:  FHR: 130 bpm, variability: moderate,  accelerations:  Present,  decelerations:  Absent UC:   regular, every 2-4 minutes SVE:   4 at external os/2.5 at internal os/50/-1 Cervix very anterior  Pt informed that the ultrasound is considered a limited OB ultrasound and is not intended to be a complete ultrasound exam.  Patient also informed that the ultrasound is not being completed with the intent of assessing for fetal or placental anomalies or any pelvic abnormalities.  Explained that the purpose of today's ultrasound is to assess for  presentation.  Patient acknowledges the purpose of the exam and the limitations of the study.    VERTEX   Labs: Lab Results  Component Value Date   WBC 10.8 (H) 04/28/2018   HGB 11.4 (L) 04/28/2018   HCT 36.3 04/28/2018   MCV 85.2 04/28/2018   PLT 272 04/28/2018    Assessment / Plan: Induction of labor due to gestational hypertension,  progressing well on pitocin  Labor: Progressing normally Preeclampsia:  Headache has returned. consult with Dr. Debroah Loop, will start magnesium sulfate.  Fetal Wellbeing:  Category I Pain Control:  Epidural I/D:  n/a Anticipated MOD:  NSVD  Thressa Sheller DNP, CNM  04/28/18  4:31 PM

## 2018-04-28 NOTE — Anesthesia Procedure Notes (Signed)
Epidural Patient location during procedure: OB Start time: 04/28/2018 2:32 PM End time: 04/28/2018 2:46 PM  Staffing Anesthesiologist: Lucretia Kern, MD Performed: anesthesiologist   Preanesthetic Checklist Completed: patient identified, pre-op evaluation, timeout performed, IV checked, risks and benefits discussed and monitors and equipment checked  Epidural Patient position: sitting Prep: DuraPrep Patient monitoring: heart rate, continuous pulse ox and blood pressure Approach: midline Location: L3-L4 Injection technique: LOR air  Needle:  Needle type: Tuohy  Needle gauge: 17 G Needle length: 9 cm Needle insertion depth: 7 cm Catheter type: closed end flexible Catheter size: 19 Gauge Catheter at skin depth: 12 cm Test dose: negative and 2% lidocaine with Epi 1:200 K  Assessment Events: blood not aspirated, injection not painful, no injection resistance, negative IV test and no paresthesia  Additional Notes Reason for block:procedure for pain
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
## 2018-04-28 NOTE — Progress Notes (Signed)
[name] is a 23 y.o. G2P1001 at [redacted]w[redacted]d admitted for induction of labor due to [facility].  Subjective:  No complaints. No questions. Feeling contractions, and states they are getting stronger and closer.   Objective: BP (!) 105/53   Pulse 67   Temp 98.2 F (36.8 C) (Oral)   Resp 16   Ht 5\' 6"  (1.676 m)   Wt 101 kg   LMP 08/02/2017 (Exact Date)   BMI 35.93 kg/m  I/O last 3 completed shifts: In: 247.4 [I.V.:247.4] Out: -  No intake/output data recorded.  FHT:  FHR: 130 bpm, variability: moderate,  accelerations:  Present,  decelerations:  Absent UC:   regular, every 5-6 minutes SVE:   Dilation: 1.5 Effacement (%): 50 Station: -3 Exam by:: [employer] CNM  Labs: Lab Results  Component Value Date   WBC 9.9 04/27/2018   HGB 12.1 04/27/2018   HCT 38.4 04/27/2018   MCV 85.5 04/27/2018   PLT 271 04/27/2018    Assessment / Plan: IOL 2/2 GHTN.   Labor: Has had cytotec x 2, will start pitocin now Preeclampsia:  Mag not started. Patient's headache has improved  Fetal Wellbeing:  Category I Pain Control:  Labor support without medications I/D:  n/a Anticipated MOD:  NSVD  Thressa Sheller DNP, CNM  04/28/18  11:02 AM

## 2018-04-28 NOTE — Discharge Summary (Signed)
OB Discharge Summary     Patient Name: [name] DOB: [birth year] MRN: [id number]  Date of admission: 04/27/2018 Delivering MD: Arvilla Market   Date of discharge: 04/30/2018  Admitting diagnosis: Constant HA  Intrauterine pregnancy: [redacted]w[redacted]d     Secondary diagnosis:  Principal Problem:   Severe preeclampsia Active Problems:   Anxiety   Depression   PTSD (post-traumatic stress disorder)   Bipolar 1 disorder, depressed, severe (HCC)   History of gestational hypertension   Current every day smoker   False positive HIV screen   Gestational hypertension  Additional problems:  Severe Pre-E on Mg++ due to headache      Discharge diagnosis: Term Pregnancy Delivered                                                                                                Post partum procedures:none  Augmentation: AROM, Pitocin and Cytotec  Complications: None  Hospital course:  Induction of Labor With Vaginal Delivery   23 y.o. yo G2P1001 at [redacted]w[redacted]d was admitted to the hospital 04/27/2018 for induction of labor.  Indication for induction: Gestational hypertension.  Patient had an  labor course complicated by severe headache, started on Mg++ for severe Pre-E.  Membrane Rupture Time/Date: 7:17 PM ,04/28/2018   Intrapartum Procedures: Episiotomy: None [1]                                         Lacerations:  1st degree [2];Vaginal [6]  Patient had delivery of a Viable infant.  Information for the patient's newborn:  Jezel, Brisky Girl Javanna [[id number]]  Delivery Method: Vag-Spont   04/28/2018  Details of delivery can be found in separate delivery note.  Patient had a routine postpartum course. Patient is discharged home 04/30/18.  Physical exam  Vitals:   04/30/18 0035 04/30/18 0312 04/30/18 0831 04/30/18 1222  BP: 118/72 128/70 109/68 134/67  Pulse: 82 86 66 99  Resp:  18 18 18   Temp: 98.1 F (36.7 C) 98.6 F (37 C) 98.7 F (37.1 C) 98.7 F (37.1 C)  TempSrc:  Oral Oral Oral  SpO2:  100% 100% 99% 100%  Weight:      Height:       General: alert, cooperative and no distress Lochia: appropriate Uterine Fundus: firm Incision: N/A DVT Evaluation: No evidence of DVT seen on physical exam. Labs: Lab Results  Component Value Date   WBC 14.0 (H) 04/28/2018   HGB 11.1 (L) 04/28/2018   HCT 34.7 (L) 04/28/2018   MCV 86.3 04/28/2018   PLT 265 04/28/2018   CMP Latest Ref Rng & Units 04/27/2018  Glucose 70 - 99 mg/dL 90  BUN 6 - 20 mg/dL 10  Creatinine 3.01 - 3.14 mg/dL 3.88  Sodium 875 - 797 mmol/L 134(L)  Potassium 3.5 - 5.1 mmol/L 3.7  Chloride 98 - 111 mmol/L 103  CO2 22 - 32 mmol/L 20(L)  Calcium 8.9 - 10.3 mg/dL 8.9  Total Protein 6.5 - 8.1 g/dL 6.7  Total Bilirubin  0.3 - 1.2 mg/dL 0.4  Alkaline Phos 38 - 126 U/L 123  AST 15 - 41 U/L 18  ALT 0 - 44 U/L 18    Discharge instruction: per After Visit Summary and "Baby and Me Booklet".  After visit meds:  Allergies as of 04/30/2018      Reactions   Penicillins Anaphylaxis, Hives   Did it involve swelling of the face/tongue/throat, SOB, or low BP? yes Did it involve sudden or severe rash/hives, skin peeling, or any reaction on the inside of your mouth or nose? yes Did you need to seek medical attention at a hospital or doctor's office? yes When did it last happen?6 years ago If all above answers are "NO", may proceed with cephalosporin use.   Latex Hives, Itching      Medication List    STOP taking these medications   acetaminophen 500 MG tablet Commonly known as:  TYLENOL   sertraline 50 MG tablet Commonly known as:  Zoloft     TAKE these medications   ibuprofen 600 MG tablet Commonly known as:  ADVIL,MOTRIN Take 1 tablet (600 mg total) by mouth every 6 (six) hours.   PARoxetine 20 MG tablet Commonly known as:  PAXIL Take 1 tablet (20 mg total) by mouth daily.   prenatal multivitamin Tabs tablet Take 1 tablet by mouth daily at 12 noon.   ziprasidone 20 MG capsule Commonly known as:   GEODON Take 1 capsule (20 mg total) by mouth 2 (two) times daily with a meal.   zolpidem 5 MG tablet Commonly known as:  AMBIEN Take 1 tablet (5 mg total) by mouth at bedtime as needed for sleep.       Diet: routine diet  Activity: Advance as tolerated. Pelvic rest for 6 weeks.   Outpatient follow up:4 weeks Follow up Appt: No future appointments. Follow up Visit:No follow-ups on file.  Postpartum contraception: Mirena  Newborn Data: Live born female  Birth Weight:   APGAR: 9, 9  Newborn Delivery   Birth date/time:  04/28/2018 20:46:00 Delivery type:  Vaginal, Spontaneous     Baby Feeding: Breast Disposition:home with mother   04/30/2018 Scheryl Darter, MD

## 2018-04-28 NOTE — Progress Notes (Signed)
[name] is a 23 y.o. G2P1001 at [redacted]w[redacted]d admitted for induction of labor due to gestational hypertension, now pre-eclampsia with severe features (headache).  Subjective:  Resting will. Very comfortable with epidural. Headache has improved since starting mag.   Objective: BP 113/65   Pulse 71   Temp 97.7 F (36.5 C) (Oral)   Resp 18   Ht 5\' 6"  (1.676 m)   Wt 101 kg   LMP 08/02/2017 (Exact Date)   SpO2 97%   BMI 35.93 kg/m  I/O last 3 completed shifts: In: 3811.3 [P.O.:1050; I.V.:2761.3] Out: 375 [Urine:375] No intake/output data recorded.  FHT:  FHR: 125 bpm, variability: moderate,  accelerations:  Present,  decelerations:  Absent UC:   regular, every 2-4 minutes  SVE:   Dilation: 5 Effacement (%): 90 Station: 0 Exam by:: H Hogan CNM   AROM, clear fluid, IUPC placed   Labs: Lab Results  Component Value Date   WBC 10.8 (H) 04/28/2018   HGB 11.4 (L) 04/28/2018   HCT 36.3 04/28/2018   MCV 85.2 04/28/2018   PLT 272 04/28/2018    Assessment / Plan: Induction of labor due to pre-eclampsia ,  progressing well on pitocin  Labor: Progressing normally Preeclampsia:  on magnesium sulfate Fetal Wellbeing:  Category I Pain Control:  Epidural I/D:  n/a Anticipated MOD:  NSVD  Thressa Sheller DNP, CNM  04/28/18  7:31 PM

## 2018-04-28 NOTE — H&P (Signed)
[name] is a 23 y.o. female presenting for contractions.  [name] is a 23 y.o. G2P1001 at 74w2dwho presents to maternity admissions reporting headache and blurred vision "for several days", unresponsive to Tylenol.  No hx of migraines.  Also has shortness of breath.  States they have been watching her BPs in office.   Hx Gestational Htn.. She reports good fetal movement, denies LOF, vaginal bleeding, vaginal itching/burning, urinary symptoms, dizziness, n/v, diarrhea, constipation or fever/chills.  She denies RUQ abdominal pain.  Tearful, nervous. First baby placed for adoption so this will be her first baby she has taken home.  OB History    Gravida  2   Para  1   Term  1   Preterm  0   AB  0   Living  1     SAB  0   TAB  0   Ectopic  0   Multiple  0   Live Births  1        Obstetric Comments  G1- [facility], lives in [place]       Past Medical History:  Diagnosis Date  . Allergic conjunctivitis   . Allergy   . Anxiety 06/04/2011   panic attacks  . Astigmatism    glasses  . Bipolar 1 disorder (HCC)   . Borderline personality disorder (HCC)   . Depression   . Obesity   . Post traumatic stress disorder   . Urinary tract infection October 13, 1993   recurrent, nl U/S, [facility])   Past Surgical History:  Procedure Laterality Date  . INGUINAL HERNIA REPAIR  7/95   bilat exploration, left repair  . UMBILICAL HERNIA REPAIR  7/95   repaired as infant   Family History: family history includes Brain cancer in her maternal grandfather; Cancer in an other family member; Hypertension in her father and mother; Hypothyroidism in her mother. Social History:  reports that she has quit smoking. Her smoking use included cigarettes. She smoked 1.00 pack per day. She has never used smokeless tobacco. She reports that she does not drink alcohol or use drugs.     Maternal Diabetes: No Genetic Screening: Normal Maternal Ultrasounds/Referrals: Normal Fetal Ultrasounds or  other Referrals:  None Maternal Substance Abuse:  Yes:  Type: Marijuana Significant Maternal Medications:  None Significant Maternal Lab Results:  Lab values include: Group B Strep negative Other Comments:  Gest Hypertension  Review of Systems  Constitutional: Negative for chills, fever and malaise/fatigue.  Eyes: Positive for blurred vision.  Respiratory: Negative for shortness of breath.   Cardiovascular: Negative for leg swelling.  Gastrointestinal: Positive for abdominal pain. Negative for constipation, diarrhea, nausea and vomiting.  Genitourinary: Negative for dysuria.  Neurological: Negative for focal weakness.   Maternal Medical History:  Reason for admission: Contractions.  Nausea.  Contractions: Onset was 3-5 hours ago.   Frequency: irregular.   Perceived severity is moderate.    Fetal activity: Perceived fetal activity is normal.   Last perceived fetal movement was within the past hour.    Prenatal complications: PIH.   No bleeding, HIV (false positive) or placental abnormality.   Prenatal Complications - Diabetes: none.    Dilation: Fingertip Effacement (%): 40 Exam by:: Wynelle Bourgeois CNM  Blood pressure (!) 125/92, pulse (!) 101, temperature 98.7 F (37.1 C), temperature source Oral, resp. rate 15, height 5\' 6"  (1.676 m), weight 101 kg, last menstrual period 08/02/2017. Maternal Exam:  Uterine Assessment: Contraction strength is moderate.  Contraction frequency is irregular.  Abdomen: Patient reports no abdominal tenderness. Introitus: Normal vulva. Normal vagina.  Ferning test: not done.  Nitrazine test: not done.  Pelvis: adequate for delivery.   Cervix: Cervix evaluated by digital exam.     Fetal Exam Fetal Monitor Review: Mode: ultrasound.   Baseline rate: 140.  Variability: moderate (6-25 bpm).   Pattern: accelerations present and no decelerations.    Fetal State Assessment: Category I - tracings are normal.     Physical Exam   Constitutional: She is oriented to person, place, and time. She appears well-developed and well-nourished. No distress.  HENT:  Head: Normocephalic.  Cardiovascular: Normal rate, regular rhythm and normal heart sounds.  Respiratory: Effort normal. No respiratory distress.  GI: Soft. She exhibits no distension. There is no abdominal tenderness. There is no rebound and no guarding.  Genitourinary:    Vulva normal.     Genitourinary Comments: Dilation: Fingertip Effacement (%): 40 Exam by:: Wynelle Bourgeois CNM     Musculoskeletal: Normal range of motion.        General: No edema.  Neurological: She is alert and oriented to person, place, and time. She displays abnormal reflex (3+). She exhibits normal muscle tone.  Skin: Skin is warm and dry.  Psychiatric: She has a normal mood and affect.    Prenatal labs: ABO, Rh: --/--/O POS, O POS Performed at [facility], [address] [place], [place] [address]  [phone] 2028) Antibody: NEG (03/31 2028) Rubella: 1.78 (09/13 1639) RPR: Non Reactive (01/22 7322)  HBsAg: Negative (09/13 1639)  HIV: Reactive (01/22 0254)  GBS:     Assessment/Plan: SIngle intrauterine pregnancy at [redacted]w[redacted]d Gestational hypertension Headache, blurred vision.  Tension vs severe feature  Admit to Labor and Delivery Routine orders Induction of labor, cytotec    Wynelle Bourgeois 04/28/2018, 12:48 AM

## 2018-04-29 MED ORDER — LACTATED RINGERS IV SOLN
INTRAVENOUS | Status: DC
  Administered 2018-04-29 (×2): via INTRAVENOUS

## 2018-04-29 MED ORDER — PAROXETINE HCL 20 MG PO TABS
20.0000 mg | ORAL_TABLET | Freq: Every day | ORAL | Status: DC
  Administered 2018-04-29 – 2018-04-30 (×2): 20 mg via ORAL
  Filled 2018-04-29 (×3): qty 1

## 2018-04-29 MED ORDER — MAGNESIUM SULFATE 40 G IN LACTATED RINGERS - SIMPLE: 2.0000 g/h | INTRAVENOUS | Status: AC

## 2018-04-29 MED ORDER — ZIPRASIDONE HCL 20 MG PO CAPS
20.0000 mg | ORAL_CAPSULE | Freq: Two times a day (BID) | ORAL | Status: DC
  Administered 2018-04-29 – 2018-04-30 (×3): 20 mg via ORAL
  Filled 2018-04-29 (×5): qty 1

## 2018-04-29 NOTE — Progress Notes (Signed)
CSW acknowledged consult and attempted to meet with MOB.  MOB currently on magnesium until 8pm tonight, per RN.  CSW will attempt to meet with MOB tomorrow.  Archie Balboa, LCSWA  [facility] [employer] [phone]

## 2018-04-29 NOTE — Anesthesia Postprocedure Evaluation (Signed)
Anesthesia Post Note  Patient: [name]  Procedure(s) Performed: AN AD HOC LABOR EPIDURAL     Patient location during evaluation: Mother Baby Anesthesia Type: Epidural Level of consciousness: awake and oriented Pain management: pain level controlled Vital Signs Assessment: post-procedure vital signs reviewed and stable Respiratory status: spontaneous breathing Cardiovascular status: blood pressure returned to baseline Postop Assessment: no headache Anesthetic complications: no    Last Vitals:  Vitals:   04/29/18 0600 04/29/18 0811  BP:  102/61  Pulse:  79  Resp: 18 17  Temp:  36.5 C  SpO2:  100%    Last Pain:  Vitals:   04/29/18 0811  TempSrc: Oral  PainSc:    Pain Goal: Patients Stated Pain Goal: 1 (04/29/18 0506)                 [name]

## 2018-04-29 NOTE — Plan of Care (Signed)
  Problem: Activity: Goal: Ability to tolerate increased activity will improve Outcome: Progressing   Problem: Coping: Goal: Ability to identify and utilize available resources and services will improve Outcome: Progressing   Problem: Life Cycle: Goal: Chance of risk for complications during the postpartum period will decrease Outcome: Progressing   Problem: Role Relationship: Goal: Ability to demonstrate positive interaction with newborn will improve Outcome: Progressing   Problem: Skin Integrity: Goal: Demonstration of wound healing without infection will improve Outcome: Progressing

## 2018-04-29 NOTE — Progress Notes (Signed)
Post Partum Day 1 Subjective: Nervous, not sleeping, states she needs her psychiatric medication restarted, Paxil and Geodon used before pregnancy  Objective: Blood pressure 102/61, pulse 79, temperature 97.7 F (36.5 C), temperature source Oral, resp. rate 17, height 5\' 6"  (1.676 m), weight 101 kg, last menstrual period 08/02/2017, SpO2 100 %, unknown if currently breastfeeding.  Physical Exam:  General: alert, cooperative and mildly anxious Lochia: appropriate Uterine Fundus: firm DVT Evaluation: No evidence of DVT seen on physical exam.  Recent Labs    04/28/18 1351 04/28/18 2207  HGB 11.4* 11.1*  HCT 36.3 34.7*    Assessment/Plan: Bipolar d/o SW consult ordered. Geodon and Paxil ordered   LOS: 1 day   Scheryl Darter 04/29/2018, 10:39 AM

## 2018-04-30 MED ORDER — ZIPRASIDONE HCL 20 MG PO CAPS: 20.0000 mg | ORAL_CAPSULE | Freq: Two times a day (BID) | ORAL | 1 refills | Status: DC

## 2018-04-30 MED ORDER — IBUPROFEN 600 MG PO TABS: 600.0000 mg | ORAL_TABLET | Freq: Four times a day (QID) | ORAL | 0 refills | Status: DC

## 2018-04-30 MED ORDER — PAROXETINE HCL 20 MG PO TABS: 20.0000 mg | ORAL_TABLET | Freq: Every day | ORAL | 1 refills | Status: DC

## 2018-04-30 NOTE — Discharge Instructions (Signed)
Hypertension During Pregnancy ° °Hypertension, commonly called high blood pressure, is when the force of blood pumping through your arteries is too strong. Arteries are blood vessels that carry blood from the heart throughout the body. Hypertension during pregnancy can cause problems for you and your baby. Your baby may be born early (prematurely) or may not weigh as much as he or she should at birth. Very bad cases of hypertension during pregnancy can be life-threatening. °Different types of hypertension can occur during pregnancy. These include: °· Chronic hypertension. This happens when: °? You have hypertension before pregnancy and it continues during pregnancy. °? You develop hypertension before you are [redacted] weeks pregnant, and it continues during pregnancy. °· Gestational hypertension. This is hypertension that develops after the 20th week of pregnancy. °· Preeclampsia, also called toxemia of pregnancy. This is a very serious type of hypertension that develops during pregnancy. It can be very dangerous for you and your baby. °? In rare cases, you may develop preeclampsia after giving birth (postpartum preeclampsia). This usually occurs within 48 hours after childbirth but may occur up to 6 weeks after giving birth. °Gestational hypertension and preeclampsia usually go away within 6 weeks after your baby is born. Women who have hypertension during pregnancy have a greater chance of developing hypertension later in life or during future pregnancies. °What are the causes? °The exact cause of hypertension during pregnancy is not known. °What increases the risk? °There are certain factors that make it more likely for you to develop hypertension during pregnancy. These include: °· Having hypertension during a previous pregnancy or prior to pregnancy. °· Being overweight. °· Being age 35 or older. °· Being pregnant for the first time. °· Being pregnant with more than one baby. °· Becoming pregnant using fertilization  methods such as IVF (in vitro fertilization). °· Having diabetes, kidney problems, or systemic lupus erythematosus. °· Having a family history of hypertension. °What are the signs or symptoms? °Chronic hypertension and gestational hypertension rarely cause symptoms. Preeclampsia causes symptoms, which may include: °· Increased protein in your urine. Your health care provider will check for this at every visit before you give birth (prenatal visit). °· Severe headaches. °· Sudden weight gain. °· Swelling of the hands, face, legs, and feet. °· Nausea and vomiting. °· Vision problems, such as blurred or double vision. °· Numbness in the face, arms, legs, and feet. °· Dizziness. °· Slurred speech. °· Sensitivity to bright lights. °· Abdominal pain. °· Convulsions or seizures. °How is this diagnosed? °You may be diagnosed with hypertension during a routine prenatal exam. At each prenatal visit, you may: °· Have a urine test to check for high amounts of protein in your urine. °· Have your blood pressure checked. A blood pressure reading is given as two numbers, such as "120 over 80" (or 120/80). The first ("top") number is a measure of the pressure in your arteries when your heart beats (systolic pressure). The second ("bottom") number is a measure of the pressure in your arteries as your heart relaxes between beats (diastolic pressure). Blood pressure is measured in a unit called mm Hg. For most women, a normal blood pressure reading is: °? Systolic: below 120. °? Diastolic: below 80. °The type of hypertension that you are diagnosed with depends on your test results and when your symptoms developed. °· Chronic hypertension is usually diagnosed before 20 weeks of pregnancy. °· Gestational hypertension is usually diagnosed after 20 weeks of pregnancy. °· Hypertension with high amounts of protein in   the urine is diagnosed as preeclampsia.  Blood pressure measurements that stay above 160 systolic, or above 110 diastolic,  are signs of severe preeclampsia. How is this treated? Treatment for hypertension during pregnancy varies depending on the type of hypertension you have and how serious it is.  If you take medicines called ACE inhibitors to treat chronic hypertension, you may need to switch medicines. ACE inhibitors should not be taken during pregnancy.  If you have gestational hypertension, you may need to take blood pressure medicine.  If you are at risk for preeclampsia, your health care provider may recommend that you take a low-dose aspirin during your pregnancy.  If you have severe preeclampsia, you may need to be hospitalized so you and your baby can be monitored closely. You may also need to take medicine (magnesium sulfate) to prevent seizures and to lower blood pressure. This medicine may be given as an injection or through an IV.  In some cases, if your condition gets worse, you may need to deliver your baby early. Follow these instructions at home: Eating and drinking   Drink enough fluid to keep your urine pale yellow.  Avoid caffeine. Lifestyle  Do not use any products that contain nicotine or tobacco, such as cigarettes and e-cigarettes. If you need help quitting, ask your health care provider.  Do not use alcohol or drugs.  Avoid stress as much as possible. Rest and get plenty of sleep. General instructions  Take over-the-counter and prescription medicines only as told by your health care provider.  While lying down, lie on your left side. This keeps pressure off your major blood vessels.  While sitting or lying down, raise (elevate) your feet. Try putting some pillows under your lower legs.  Exercise regularly. Ask your health care provider what kinds of exercise are best for you.  Keep all prenatal and follow-up visits as told by your health care provider. This is important. Contact a health care provider if:  You have symptoms that your health care provider told you may  require more treatment or monitoring, such as: ? Nausea or vomiting. ? Headache. Get help right away if you have:  Severe abdominal pain that does not get better with treatment.  A severe headache that does not get better.  Vomiting that does not get better.  Sudden, rapid weight gain.  Sudden swelling in your hands, ankles, or face.  Vaginal bleeding.  Blood in your urine.  Fewer movements from your baby than usual.  Blurred or double vision.  Muscle twitching or sudden muscle tightening (spasms).  Shortness of breath.  Blue fingernails or lips. Summary  Hypertension, commonly called high blood pressure, is when the force of blood pumping through your arteries is too strong.  Hypertension during pregnancy can cause problems for you and your baby.  Treatment for hypertension during pregnancy varies depending on the type of hypertension you have and how serious it is.  Get help right away if you have symptoms that your health care provider told you to watch for. This information is not intended to replace advice given to you by your health care provider. Make sure you discuss any questions you have with your health care provider. Document Released: 10/01/2010 Document Revised: 12/30/2016 Document Reviewed: 06/29/2015 Elsevier Interactive Patient Education  2019 [employer] Postpartum Care After Vaginal Delivery This sheet gives you information about how to care for yourself from the time you deliver your baby to up to 6-12 weeks after delivery (postpartum period). Your  health care provider may also give you more specific instructions. If you have problems or questions, contact your health care provider. Follow these instructions at home: Vaginal bleeding  It is normal to have vaginal bleeding (lochia) after delivery. Wear a sanitary pad for vaginal bleeding and discharge. ? During the first week after delivery, the amount and appearance of lochia is often similar to a  menstrual period. ? Over the next few weeks, it will gradually decrease to a dry, yellow-brown discharge. ? For most women, lochia stops completely by 4-6 weeks after delivery. Vaginal bleeding can vary from woman to woman.  Change your sanitary pads frequently. Watch for any changes in your flow, such as: ? A sudden increase in volume. ? A change in color. ? Large blood clots.  If you pass a blood clot from your vagina, save it and call your health care provider to discuss. Do not flush blood clots down the toilet before talking with your health care provider.  Do not use tampons or douches until your health care provider says this is safe.  If you are not breastfeeding, your period should return 6-8 weeks after delivery. If you are feeding your child breast milk only (exclusive breastfeeding), your period may not return until you stop breastfeeding. Perineal care  Keep the area between the vagina and the anus (perineum) clean and dry as told by your health care provider. Use medicated pads and pain-relieving sprays and creams as directed.  If you had a cut in the perineum (episiotomy) or a tear in the vagina, check the area for signs of infection until you are healed. Check for: ? More redness, swelling, or pain. ? Fluid or blood coming from the cut or tear. ? Warmth. ? Pus or a bad smell.  You may be given a squirt bottle to use instead of wiping to clean the perineum area after you go to the bathroom. As you start healing, you may use the squirt bottle before wiping yourself. Make sure to wipe gently.  To relieve pain caused by an episiotomy, a tear in the vagina, or swollen veins in the anus (hemorrhoids), try taking a warm sitz bath 2-3 times a day. A sitz bath is a warm water bath that is taken while you are sitting down. The water should only come up to your hips and should cover your buttocks. Breast care  Within the first few days after delivery, your breasts may feel heavy,  full, and uncomfortable (breast engorgement). Milk may also leak from your breasts. Your health care provider can suggest ways to help relieve the discomfort. Breast engorgement should go away within a few days.  If you are breastfeeding: ? Wear a bra that supports your breasts and fits you well. ? Keep your nipples clean and dry. Apply creams and ointments as told by your health care provider. ? You may need to use breast pads to absorb milk that leaks from your breasts. ? You may have uterine contractions every time you breastfeed for up to several weeks after delivery. Uterine contractions help your uterus return to its normal size. ? If you have any problems with breastfeeding, work with your health care provider or [employer].  If you are not breastfeeding: ? Avoid touching your breasts a lot. Doing this can make your breasts produce more milk. ? Wear a good-fitting bra and use cold packs to help with swelling. ? Do not squeeze out (express) milk. This causes you to make more milk.  Intimacy and sexuality  Ask your health care provider when you can engage in sexual activity. This may depend on: ? Your risk of infection. ? How fast you are healing. ? Your comfort and desire to engage in sexual activity.  You are able to get pregnant after delivery, even if you have not had your period. If desired, talk with your health care provider about methods of birth control (contraception). Medicines  Take over-the-counter and prescription medicines only as told by your health care provider.  If you were prescribed an antibiotic medicine, take it as told by your health care provider. Do not stop taking the antibiotic even if you start to feel better. Activity  Gradually return to your normal activities as told by your health care provider. Ask your health care provider what activities are safe for you.  Rest as much as possible. Try to rest or take a nap while your baby is  sleeping. Eating and drinking   Drink enough fluid to keep your urine pale yellow.  Eat high-fiber foods every day. These may help prevent or relieve constipation. High-fiber foods include: ? Whole grain cereals and breads. ? Brown rice. ? Beans. ? Fresh fruits and vegetables.  Do not try to lose weight quickly by cutting back on calories.  Take your prenatal vitamins until your postpartum checkup or until your health care provider tells you it is okay to stop. Lifestyle  Do not use any products that contain nicotine or tobacco, such as cigarettes and e-cigarettes. If you need help quitting, ask your health care provider.  Do not drink alcohol, especially if you are breastfeeding. General instructions  Keep all follow-up visits for you and your baby as told by your health care provider. Most women visit their health care provider for a postpartum checkup within the first 3-6 weeks after delivery. Contact a health care provider if:  You feel unable to cope with the changes that your child brings to your life, and these feelings do not go away.  You feel unusually sad or worried.  Your breasts become red, painful, or hard.  You have a fever.  You have trouble holding urine or keeping urine from leaking.  You have little or no interest in activities you used to enjoy.  You have not breastfed at all and you have not had a menstrual period for 12 weeks after delivery.  You have stopped breastfeeding and you have not had a menstrual period for 12 weeks after you stopped breastfeeding.  You have questions about caring for yourself or your baby.  You pass a blood clot from your vagina. Get help right away if:  You have chest pain.  You have difficulty breathing.  You have sudden, severe leg pain.  You have severe pain or cramping in your lower abdomen.  You bleed from your vagina so much that you fill more than one sanitary pad in one hour. Bleeding should not be heavier  than your heaviest period.  You develop a severe headache.  You faint.  You have blurred vision or spots in your vision.  You have bad-smelling vaginal discharge.  You have thoughts about hurting yourself or your baby. If you ever feel like you may hurt yourself or others, or have thoughts about taking your own life, get help right away. You can go to the nearest emergency department or call:  Your local emergency services (911 in the [place]  A suicide crisis helpline, such as the National Suicide Prevention  Lifeline at 5718245957. This is open 24 hours a day. Summary  The period of time right after you deliver your newborn up to 6-12 weeks after delivery is called the postpartum period.  Gradually return to your normal activities as told by your health care provider.  Keep all follow-up visits for you and your baby as told by your health care provider. This information is not intended to replace advice given to you by your health care provider. Make sure you discuss any questions you have with your health care provider. Document Released: 11/10/2006 Document Revised: 10/27/2016 Document Reviewed: 10/27/2016 Elsevier Interactive Patient Education  2019 [employer]. Postpartum Baby Blues The postpartum period begins right after the birth of a baby. During this time, there is often a lot of joy and excitement. It is also a time of many changes in the life of the parents. No matter how many times a mother gives birth, each child brings new challenges to the family, including different ways of relating to one another. It is common to have feelings of excitement along with confusing changes in moods, emotions, and thoughts. You may feel happy one minute and sad or stressed the next. These feelings of sadness usually happen in the period right after you have your baby, and they go away within a week or two. This is called the "baby blues." What are the causes? There is no known cause of  baby blues. It is likely caused by a combination of factors. However, changes in hormone levels after childbirth are believed to trigger some of the symptoms. Other factors that can play a role in these mood changes include:  Lack of sleep.  Stressful life events, such as poverty, caring for a loved one, or death of a loved one.  Genetics. What are the signs or symptoms? Symptoms of this condition include:  Brief changes in mood, such as going from extreme happiness to sadness.  Decreased concentration.  Difficulty sleeping.  Crying spells and tearfulness.  Loss of appetite.  Irritability.  Anxiety. If the symptoms of baby blues last for more than 2 weeks or become more severe, you may have postpartum depression. How is this diagnosed? This condition is diagnosed based on an evaluation of your symptoms. There are no medical or lab tests that lead to a diagnosis, but there are various questionnaires that a health care provider may use to identify women with the baby blues or postpartum depression. How is this treated? Treatment is not needed for this condition. The baby blues usually go away on their own in 1-2 weeks. Social support is often all that is needed. You will be encouraged to get adequate sleep and rest. Follow these instructions at home: Lifestyle      Get as much rest as you can. Take a nap when the baby sleeps.  Exercise regularly as told by your health care provider. Some women find yoga and walking to be helpful.  Eat a balanced and nourishing diet. This includes plenty of fruits and vegetables, whole grains, and lean proteins.  Do little things that you enjoy. Have a cup of tea, take a bubble bath, read your favorite magazine, or listen to your favorite music.  Avoid alcohol.  Ask for help with household chores, cooking, grocery shopping, or running errands. Do not try to do everything yourself. Consider hiring a postpartum doula to help. This is a  professional who specializes in providing support to new mothers.  Try not to make any major  life changes during pregnancy or right after giving birth. This can add stress. General instructions  Talk to people close to you about how you are feeling. Get support from your partner, family members, friends, or other new moms. You may want to join a support group.  Find ways to cope with stress. This may include: ? Writing your thoughts and feelings in a journal. ? Spending time outside. ? Spending time with people who make you laugh.  Try to stay positive in how you think. Think about the things you are grateful for.  Take over-the-counter and prescription medicines only as told by your health care provider.  Let your health care provider know if you have any concerns.  Keep all postpartum visits as told by your health care provider. This is important. Contact a health care provider if:  Your baby blues do not go away after 2 weeks. Get help right away if:  You have thoughts of taking your own life (suicidal thoughts).  You think you may harm the baby or other people.  You see or hear things that are not there (hallucinations). Summary  After giving birth, you may feel happy one minute and sad or stressed the next. Feelings of sadness that happen right after the baby is born and go away after a week or two are called the "baby blues."  You can manage the baby blues by getting enough rest, eating a healthy diet, exercising, spending time with supportive people, and finding ways to cope with stress.  If feelings of sadness and stress last longer than 2 weeks or get in the way of caring for your baby, talk to your health care provider. This may mean you have postpartum depression. This information is not intended to replace advice given to you by your health care provider. Make sure you discuss any questions you have with your health care provider. Document Released: 10/18/2003  Document Revised: 03/11/2016 Document Reviewed: 03/11/2016 Elsevier Interactive Patient Education  2019 [employer].

## 2018-04-30 NOTE — Progress Notes (Signed)
Teaching complete   Tag off teaching complete

## 2018-04-30 NOTE — Clinical Social Work Maternal (Signed)
CLINICAL SOCIAL WORK MATERNAL/CHILD NOTE  Patient Details  Name: [name] MRN: [id number] Date of Birth: 02/20/1995  Date:  04/30/2018  Clinical Social Worker Initiating Note:  Khushboo Chuck Irwin Date/Time: Initiated:  04/30/18/1128     Child's Name:  [name]   Biological Parents:  Mother, Father([name] and [name] DOB: 10/15/1986)   Need for Interpreter:  None   Reason for Referral:  Behavioral Health Concerns   Address:  [address] [place] [address]    Phone number:  [phone] (home)     Additional phone number:   Household Members/Support Persons (HM/SP):   Household Member/Support Person 1   HM/SP Name Relationship DOB or Age  HM/SP -1   Roommate    HM/SP -2        HM/SP -3        HM/SP -4        HM/SP -5        HM/SP -6        HM/SP -7        HM/SP -8          Natural Supports (not living in the home):  Friends, Spouse/significant other   Professional Supports: (CSW to make CC4C referral)   Employment: Unemployed   Type of Work:     Education:  Some College   Homebound arranged:    Financial Resources:  Medicaid   Other Resources:  WIC, Food Stamps    Cultural/Religious Considerations Which May Impact Care:    Strengths:  Ability to meet basic needs , Home prepared for child , Pediatrician chosen   Psychotropic Medications:         Pediatrician:    [place] (including [employer] and surronding areas)  Pediatrician List:   [place]    [place]    [place]    [place]    [place] Other([facility])  [place]      Pediatrician Fax Number:    Risk Factors/Current Problems:  Mental Health Concerns    Cognitive State:  Able to Concentrate , Alert , Insightful , Linear Thinking    Mood/Affect:  Bright , Calm , Comfortable , Interested , Happy    CSW Assessment: CSW received consult for hx of bipolar disorder and PTSD.  CSW met  with MOB to offer support and complete assessment.    MOB sitting up in bed holding infant with FOB sitting on couch. CSW introduced self and role and requested that FOB step out of the room while CSW completed assessment. FOB left voluntarily. CSW explained reason for consult and MOB expressed understanding. MOB pleasant and appropriate with CSW throughout assessment. MOB reported she is currently living with a roommate in [place]. MOB stated she is considering moving in with FOB but that it hasn't been confirmed yet. MOB reported this is her second child and that her first child ([name] DOB: 10/24/2012) was adopted by her paternal grandmother, [name], and they live in [place], [place]. MOB stated she misses her everyday but that the adoption is open and she is able to talk to and see her whenever she would like. MOB reported she is not currently employed and that her highest level of education is some college. MOB stated she currently gets Medicaid, WIC and Food Stamps and was informed that she would need to reach out to them to have infant added to her plan.   CSW inquired about MOB's   mental health history. Per MOB, she has been diagnosed with bipolar disorder, borderline personality disorder, depression, anxiety and PTSD. MOB reported she was diagnosed with most of the above when she was 9-years-old and was diagnosed with borderline personality disorder in 2015. MOB reported she has been on a lot of medications and participated in therapy until she was 16 due to being in the foster care system. MOB stated she stopped all medications when she found out she was pregnant and reported her experience was "very, very rough". MOB informed CSW she was restarted on Paxil and Geodon here in the hospital and already feels "so much better". MOB stated this is the only medication that has worked for her. MOB informed CSW she plans to schedule an appointment with [facility] in [place] to  assist with medication management and requested resources for 1-on-1 counseling. CSW provided these resources and received verbal permission to make a CC4C referral. 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 MOB reported she wants to make sure she gets "ahead" of the PPD and that is why she restarted her medication.   CSW provided education regarding the baby blues period vs. perinatal mood disorders, discussed treatment and gave resources for mental health follow up if concerns arise.  CSW recommends self-evaluation during the postpartum time period using the New Mom Checklist from Postpartum Progress and encouraged MOB to contact a medical professional if symptoms are noted at any time. MOB open and receptive to education. MOB denied any current SI or HI and stated she was feeling "great". MOB reported having a good support system that consists of FOB, her aunt and her best friend.   MOB stated she had all essential items for infant once discharged. MOB reported baby will sleep in her crib once home. CSW provided review of Sudden Infant Death Syndrome (SIDS) precautions and safe sleeping habits. MOB open to education and reported she has an appointment scheduled with [facility].  MOB denied any further questions and concerns at this time.    CSW Plan/Description:  No Further Intervention Required/No Barriers to Discharge, Sudden Infant Death Syndrome (SIDS) Education, Perinatal Mood and Anxiety Disorder (PMADs) Education, Other Information/Referral to Community Resources    Wyolene Weimann  Irwin, LCSWA 04/30/2018, 1:31 PM 

## 2018-05-03 ENCOUNTER — Encounter: Payer: Self-pay | Admitting: Family Medicine

## 2018-05-04 ENCOUNTER — Encounter: Payer: Self-pay | Admitting: *Deleted

## 2018-05-05 ENCOUNTER — Telehealth: Payer: Self-pay | Admitting: Obstetrics & Gynecology

## 2018-05-05 NOTE — Telephone Encounter (Signed)
Scheduled the patient for an incision check and bp. Placing on the wait list for PP and also IUD. She will also be scheduled to see Asher Muir on the day of the PP visit.

## 2018-05-09 ENCOUNTER — Inpatient Hospital Stay (HOSPITAL_COMMUNITY): Admit: 2018-05-09 | Payer: Self-pay

## 2018-05-12 ENCOUNTER — Ambulatory Visit: Payer: Self-pay

## 2018-05-13 ENCOUNTER — Telehealth: Payer: Self-pay | Admitting: Obstetrics and Gynecology

## 2018-05-13 NOTE — Telephone Encounter (Signed)
Called the patient to inform of upcoming postpartum appointment. Left a detailed message.

## 2018-05-31 ENCOUNTER — Telehealth: Payer: Self-pay | Admitting: Family Medicine

## 2018-05-31 NOTE — Telephone Encounter (Signed)
Called the patient to confirm the appointment. Left a detailed voicemail message.

## 2018-06-01 ENCOUNTER — Telehealth: Payer: Self-pay | Admitting: Advanced Practice Midwife

## 2018-06-01 ENCOUNTER — Ambulatory Visit: Payer: Self-pay | Admitting: Student

## 2018-06-01 ENCOUNTER — Ambulatory Visit: Payer: Self-pay | Admitting: Advanced Practice Midwife

## 2018-06-01 NOTE — Telephone Encounter (Signed)
Called the patient to inform of missed appointment. Left a detailed voicemail and sending a missed appointment letter.

## 2018-06-04 NOTE — Telephone Encounter (Signed)
Opened in error

## 2018-09-22 ENCOUNTER — Other Ambulatory Visit: Payer: Self-pay

## 2018-09-22 ENCOUNTER — Telehealth (INDEPENDENT_AMBULATORY_CARE_PROVIDER_SITE_OTHER): Payer: Medicaid Other | Admitting: Clinical

## 2018-09-22 DIAGNOSIS — F3163 Bipolar disorder, current episode mixed, severe, without psychotic features: Secondary | ICD-10-CM

## 2018-09-22 NOTE — BH Specialist Note (Signed)
Integrated Behavioral Health via Telemedicine Video Visit  09/22/2018 [name] [id number]  Number of [place] visits: 5  Session Start time: 3:16  Session End time: 3:38 Total time: 20 minutes  Referring Provider: Emeterio Reeve, MD Type of Visit: Video Patient/Family location: Home [facility] Provider location: [facility] All persons participating in visit: Patient [name] and [name]  Confirmed patient's address: Yes  Confirmed patient's phone number: Yes  Any changes to demographics: No   Confirmed patient's insurance: Yes  Any changes to patient's insurance: No   Discussed confidentiality: Yes   I connected with [name] a video enabled telemedicine application and verified that I am speaking with the correct person using two identifiers.     I discussed the limitations of evaluation and management by telemedicine and the availability of in person appointments.  I discussed that the purpose of this visit is to provide behavioral health care while limiting exposure to the novel coronavirus.   Discussed there is a possibility of technology failure and discussed alternative modes of communication if that failure occurs.  I discussed that engaging in this video visit, they consent to the provision of behavioral healthcare and the services will be billed under their insurance.  Patient and/or legal guardian expressed understanding and consented to video visit: Yes   PRESENTING CONCERNS: Patient and/or family reports the following symptoms/concerns: Pt states that she has been unmedicated due to pharmacy saying that her prescription had expired, and she has been unable to get into [facility] because she does not have an ID. Pt has an appointment at [facility] in late September to obtain a [facility] ID (due to covid requirements of an appointment). Pt coped best on Geodon only. No SI, no HI.   Duration of problem: Increase in symptoms of bipolar disorder  in past two months of being untreated; Severity of problem: severe  STRENGTHS (Protective Factors/Coping Skills): Recognizing her escalation of mood instability  GOALS ADDRESSED: Patient will: 1.  Reduce symptoms of: mood instability  2.  Increase knowledge and/or ability of: stress reduction  3.  Demonstrate ability to: Increase adequate support systems for patient/family, Increase motivation to adhere to plan of care and Improve medication compliance  INTERVENTIONS: Interventions utilized:  Medication Monitoring and Link to [employer] Standardized Assessments completed: Not Needed  ASSESSMENT: Patient currently experiencing Bipolar affective disorder, recurrent, mixed, severe.   Patient may benefit from brief therapeutic interventions regarding coping with mood instability.  PLAN: 1. Follow up with behavioral health clinician on : One week 2. Behavioral recommendations:  -Accept referral to psychiatry -Contact [facility], as needed and discussed -Begin taking BH medication as prescribed -Register to attend at least one new mom online support group at [email or web address]  3. Referral(s): Integrated Orthoptist (In Clinic) and [employer] Resources:  New mom support  I discussed the assessment and treatment plan with the patient and/or parent/guardian. They were provided an opportunity to ask questions and all were answered. They agreed with the plan and demonstrated an understanding of the instructions.   They were advised to call back or seek an in-person evaluation if the symptoms worsen or if the condition fails to improve as anticipated.  [name] [name]

## 2018-09-30 ENCOUNTER — Other Ambulatory Visit: Payer: Self-pay

## 2018-09-30 ENCOUNTER — Telehealth (INDEPENDENT_AMBULATORY_CARE_PROVIDER_SITE_OTHER): Payer: Medicaid Other | Admitting: Clinical

## 2018-09-30 DIAGNOSIS — F3163 Bipolar disorder, current episode mixed, severe, without psychotic features: Secondary | ICD-10-CM | POA: Diagnosis not present

## 2018-09-30 NOTE — BH Specialist Note (Signed)
Integrated Behavioral Health via Telemedicine Video Visit  09/30/2018 [name] [id number]  Number of [place] visits: 6 total  Session Start time: 3:46  Session End time: 3:01 Total time: 15 minutes  Referring Provider: Mora Bellman, at previous visit Type of Visit: Telephone Patient/Family location: Home [facility] Provider location: [facility] All persons participating in visit: Patient [name] and Mitchell  Confirmed patient's address: Yes  Confirmed patient's phone number: Yes  Any changes to demographics: No   Confirmed patient's insurance: Yes  Any changes to patient's insurance: No   Discussed confidentiality: At previous visit  I connected with [name]  by a telephone enabled telemedicine application and verified that I am speaking with the correct person using two identifiers.     I discussed the limitations of evaluation and management by telemedicine and the availability of in person appointments.  I discussed that the purpose of this visit is to provide behavioral health care while limiting exposure to the novel coronavirus.   Discussed there is a possibility of technology failure and discussed alternative modes of communication if that failure occurs.  I discussed that engaging in this video visit, they consent to the provision of behavioral healthcare and the services will be billed under their insurance.  Patient and/or legal guardian expressed understanding and consented to video visit: Yes   PRESENTING CONCERNS: Patient and/or family reports the following symptoms/concerns:   Duration of problem: Ongoing; Severity of problem: severe  STRENGTHS (Protective Factors/Coping Skills): Self-awareness  GOALS ADDRESSED: Patient will: 1.  Reduce symptoms of: mood instability    INTERVENTIONS: Interventions utilized:  Supportive Counseling and Medication Monitoring Standardized Assessments completed: Not  Needed  ASSESSMENT: Patient currently experiencing Bipolar disorder, current episode mixed, severe, without psychotic features.   Patient may benefit from brief therapeutic interventions today, until established with psychiatry.  PLAN: 1. Follow up with behavioral health clinician on : One week phone follow up 2. Behavioral recommendations:  -Call [facility] tomorrow morning; if unable to obtain an appointment same-day, go to [facility] walk-in  3. Referral(s): [facility] (In Clinic)  I discussed the assessment and treatment plan with the patient and/or parent/guardian. They were provided an opportunity to ask questions and all were answered. They agreed with the plan and demonstrated an understanding of the instructions.   They were advised to call back or seek an in-person evaluation if the symptoms worsen or if the condition fails to improve as anticipated.  Caroleen Hamman 

## 2018-10-07 ENCOUNTER — Telehealth: Payer: Self-pay | Admitting: Clinical

## 2018-10-08 NOTE — Telephone Encounter (Signed)
Attempt to follow up with patient; Left HIPPA-compliant message to call back Roselyn Reef from [facility] [employer] at [phone].

## 2018-10-18 ENCOUNTER — Inpatient Hospital Stay (HOSPITAL_COMMUNITY)
Admission: AD | Admit: 2018-10-18 | Discharge: 2018-10-20 | DRG: 885 | Disposition: A | Discharge status: Home / Self Care | Payer: Medicaid Other | Source: Intra-hospital | Attending: Psychiatry | Admitting: Psychiatry

## 2018-10-18 ENCOUNTER — Encounter (HOSPITAL_COMMUNITY): Payer: Self-pay | Admitting: *Deleted

## 2018-10-18 ENCOUNTER — Other Ambulatory Visit: Payer: Self-pay

## 2018-10-18 DIAGNOSIS — Z915 Personal history of self-harm: Secondary | ICD-10-CM

## 2018-10-18 DIAGNOSIS — G4700 Insomnia, unspecified: Secondary | ICD-10-CM | POA: Diagnosis present

## 2018-10-18 DIAGNOSIS — Z880 Allergy status to penicillin: Secondary | ICD-10-CM

## 2018-10-18 DIAGNOSIS — T450X2A Poisoning by antiallergic and antiemetic drugs, intentional self-harm, initial encounter: Secondary | ICD-10-CM | POA: Diagnosis present

## 2018-10-18 DIAGNOSIS — F17210 Nicotine dependence, cigarettes, uncomplicated: Secondary | ICD-10-CM | POA: Diagnosis present

## 2018-10-18 DIAGNOSIS — F4310 Post-traumatic stress disorder, unspecified: Secondary | ICD-10-CM | POA: Diagnosis present

## 2018-10-18 DIAGNOSIS — F314 Bipolar disorder, current episode depressed, severe, without psychotic features: Principal | ICD-10-CM | POA: Diagnosis present

## 2018-10-18 LAB — PREGNANCY, URINE: Preg Test, Ur: NEGATIVE

## 2018-10-18 MED ORDER — TRAZODONE HCL 50 MG PO TABS: 50.0000 mg | ORAL_TABLET | Freq: Every evening | ORAL | Status: DC | PRN

## 2018-10-18 MED ORDER — VENLAFAXINE HCL ER 37.5 MG PO CP24
37.5000 mg | ORAL_CAPSULE | Freq: Every day | ORAL | Status: DC
  Filled 2018-10-18 (×2): qty 1

## 2018-10-18 MED ORDER — ACETAMINOPHEN 325 MG PO TABS
650.0000 mg | ORAL_TABLET | Freq: Four times a day (QID) | ORAL | Status: DC | PRN
  Administered 2018-10-19: 650 mg via ORAL
  Filled 2018-10-18: qty 2

## 2018-10-18 MED ORDER — ALUM & MAG HYDROXIDE-SIMETH 200-200-20 MG/5ML PO SUSP: 30.0000 mL | ORAL | Status: DC | PRN

## 2018-10-18 MED ORDER — LORAZEPAM 0.5 MG PO TABS
0.5000 mg | ORAL_TABLET | Freq: Four times a day (QID) | ORAL | Status: DC | PRN
  Administered 2018-10-18 – 2018-10-20 (×4): 0.5 mg via ORAL
  Filled 2018-10-18 (×4): qty 1

## 2018-10-18 MED ORDER — LURASIDONE HCL 20 MG PO TABS
20.0000 mg | ORAL_TABLET | Freq: Every day | ORAL | Status: DC
  Administered 2018-10-19 – 2018-10-20 (×2): 20 mg via ORAL
  Filled 2018-10-18 (×4): qty 1

## 2018-10-18 MED ORDER — ADULT MULTIVITAMIN W/MINERALS CH
1.0000 | ORAL_TABLET | Freq: Every day | ORAL | Status: DC
  Administered 2018-10-18 – 2018-10-20 (×3): 1 via ORAL
  Filled 2018-10-18 (×6): qty 1

## 2018-10-18 MED ORDER — ZIPRASIDONE HCL 20 MG PO CAPS
20.0000 mg | ORAL_CAPSULE | Freq: Two times a day (BID) | ORAL | Status: DC
  Filled 2018-10-18 (×2): qty 1

## 2018-10-18 MED ORDER — HYDROXYZINE HCL 25 MG PO TABS: 25.0000 mg | ORAL_TABLET | Freq: Three times a day (TID) | ORAL | Status: DC | PRN

## 2018-10-18 MED ORDER — NICOTINE POLACRILEX 2 MG MT GUM
2.0000 mg | CHEWING_GUM | OROMUCOSAL | Status: DC | PRN
  Administered 2018-10-18 – 2018-10-20 (×7): 2 mg via ORAL
  Filled 2018-10-18 (×4): qty 1

## 2018-10-18 MED ORDER — MAGNESIUM HYDROXIDE 400 MG/5ML PO SUSP: 30.0000 mL | Freq: Every day | ORAL | Status: DC | PRN

## 2018-10-18 MED ORDER — INFLUENZA VAC SPLIT QUAD 0.5 ML IM SUSY
0.5000 mL | PREFILLED_SYRINGE | INTRAMUSCULAR | Status: DC
  Filled 2018-10-18: qty 0.5

## 2018-10-18 MED ORDER — ENSURE ENLIVE PO LIQD
237.0000 mL | Freq: Two times a day (BID) | ORAL | Status: DC
  Administered 2018-10-18 – 2018-10-20 (×2): 237 mL via ORAL

## 2018-10-18 NOTE — Plan of Care (Signed)
Nurse discussed anxiety, depression and coping skills with patient.  

## 2018-10-18 NOTE — Progress Notes (Signed)
Recreation Therapy Notes  Date:  9.21.20 Time: 0930 Location: [address] Dayroom  Group Topic: Stress Management  Goal Area(s) Addresses:  Patient will identify positive stress management techniques. Patient will identify benefits of using stress management post d/c.  Intervention: Stress Management  Activity :  Meditation.  LRT played a meditation that focused having choices.  Patients were to listen and follow as meditation was played to fully engage.  Education:  Stress Management, Discharge Planning.   Education Outcome: Acknowledges Education  Clinical Observations/Feedback:  Pt did not attend activity.   Victorino Sparrow, LRT/CTRS         Ria Comment, Blanchard [place] A 10/18/2018 11:08 AM
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
## 2018-10-18 NOTE — BHH Suicide Risk Assessment (Signed)
[place] INPATIENT:  Family/Significant Other Suicide Prevention Education  Suicide Prevention Education:  Patient Refusal for Family/Significant Other Suicide Prevention Education: The patient [name] has refused to provide written consent for family/significant other to be provided Family/Significant Other Suicide Prevention Education during admission and/or prior to discharge.  Physician notified.   SPE completed with patient, as patient refused to consent to family contact. SPI pamphlet provided to pt and pt was encouraged to share information with support network, ask questions, and talk about any concerns relating to SPE. Patient denies access to guns/firearms and verbalized understanding of information provided. Mobile Crisis information also provided to patient.    Marylee Floras 10/18/2018, 2:25 PM

## 2018-10-18 NOTE — Tx Team (Signed)
Interdisciplinary Treatment and Diagnostic Plan Update  10/18/2018 Time of Session: 9:35am [name] MRN: [id number]  Principal Diagnosis: <principal problem not specified>  Secondary Diagnoses: Active Problems:   Bipolar 1 disorder, depressed, severe (HCC)   Current Medications:  Current Facility-Administered Medications  Medication Dose Route Frequency Provider Last Rate Last Dose  . acetaminophen (TYLENOL) tablet 650 mg  650 mg Oral Q6H PRN Rozetta Nunnery, NP      . alum & mag hydroxide-simeth (MAALOX/MYLANTA) 200-200-20 MG/5ML suspension 30 mL  30 mL Oral Q4H PRN Lindon Romp A, NP      . feeding supplement (ENSURE ENLIVE) (ENSURE ENLIVE) liquid 237 mL  237 mL Oral BID BM Cobos, Myer Peer, MD      . hydrOXYzine (ATARAX/VISTARIL) tablet 25 mg  25 mg Oral TID PRN Rozetta Nunnery, NP      . Derrill Memo ON 10/19/2018] influenza vac split quadrivalent PF (FLUARIX) injection 0.5 mL  0.5 mL Intramuscular Tomorrow-1000 Cobos, Fernando A, MD      . magnesium hydroxide (MILK OF MAGNESIA) suspension 30 mL  30 mL Oral Daily PRN Lindon Romp A, NP      . multivitamin with minerals tablet 1 tablet  1 tablet Oral Daily Cobos, Myer Peer, MD   1 tablet at 10/18/18 0947  . traZODone (DESYREL) tablet 50 mg  50 mg Oral QHS PRN Lindon Romp A, NP       PTA Medications: No medications prior to admission.    Patient Stressors: Financial difficulties Marital or family conflict Medication change or noncompliance Occupational concerns  Patient Strengths: Active sense of humor Average or above average intelligence Communication skills Motivation for treatment/growth  Treatment Modalities: Medication Management, Group therapy, Case management,  1 to 1 session with clinician, Psychoeducation, Recreational therapy.   Physician Treatment Plan for Primary Diagnosis: <principal problem not specified> Long Term Goal(s):     Short Term Goals:    Medication Management: Evaluate patient's response, side  effects, and tolerance of medication regimen.  Therapeutic Interventions: 1 to 1 sessions, Unit Group sessions and Medication administration.  Evaluation of Outcomes: Not Met  Physician Treatment Plan for Secondary Diagnosis: Active Problems:   Bipolar 1 disorder, depressed, severe ([place])  Long Term Goal(s):     Short Term Goals:       Medication Management: Evaluate patient's response, side effects, and tolerance of medication regimen.  Therapeutic Interventions: 1 to 1 sessions, Unit Group sessions and Medication administration.  Evaluation of Outcomes: Not Met   RN Treatment Plan for Primary Diagnosis: <principal problem not specified> Long Term Goal(s): Knowledge of disease and therapeutic regimen to maintain health will improve  Short Term Goals: Ability to participate in decision making will improve, Ability to verbalize feelings will improve, Ability to identify and develop effective coping behaviors will improve and Compliance with prescribed medications will improve  Medication Management: RN will administer medications as ordered by provider, will assess and evaluate patient's response and provide education to patient for prescribed medication. RN will report any adverse and/or side effects to prescribing provider.  Therapeutic Interventions: 1 on 1 counseling sessions, Psychoeducation, Medication administration, Evaluate responses to treatment, Monitor vital signs and CBGs as ordered, Perform/monitor CIWA, COWS, AIMS and Fall Risk screenings as ordered, Perform wound care treatments as ordered.  Evaluation of Outcomes: Not Met   LCSW Treatment Plan for Primary Diagnosis: <principal problem not specified> Long Term Goal(s): Safe transition to appropriate next level of care at discharge, Engage patient in therapeutic group addressing  interpersonal concerns.  Short Term Goals: Engage patient in aftercare planning with referrals and resources  Therapeutic Interventions:  Assess for all discharge needs, 1 to 1 time with Social worker, Explore available resources and support systems, Assess for adequacy in community support network, Educate family and significant other(s) on suicide prevention, Complete Psychosocial Assessment, Interpersonal group therapy.  Evaluation of Outcomes: Not Met   Progress in Treatment: Attending groups: No. New to unit  Participating in groups: No. Taking medication as prescribed: Yes. Toleration medication: Yes. Family/Significant other contact made: No, will contact:  if patient consents to collateral contacts Patient understands diagnosis: Yes. Discussing patient identified problems/goals with staff: Yes. Medical problems stabilized or resolved: Yes. Denies suicidal/homicidal ideation: Yes. Issues/concerns per patient self-inventory: No. Other:   New problem(s) identified: None   New Short Term/Long Term Goal(s):  medication stabilization, elimination of SI thoughts, development of comprehensive mental wellness plan.    Patient Goals: "To get back on my medications and become stable again"    Discharge Plan or Barriers: Patient recently admitted. CSW will continue to follow and assess for appropriate referrals and possible discharge planning.    Reason for Continuation of Hospitalization: Anxiety Depression Medication stabilization Suicidal ideation  Estimated Length of Stay: 3-5 days   Attendees: Patient: [name]  10/18/2018 10:04 AM  Physician: Dr. Neita Garnet, MD 10/18/2018 10:04 AM  Nursing: Grayland Ormond, RN 10/18/2018 10:04 AM  RN Care Manager: 10/18/2018 10:04 AM  Social Worker: Radonna Ricker, LCSW 10/18/2018 10:04 AM  Recreational Therapist:  10/18/2018 10:04 AM  Other: Harriett Sine, NP 10/18/2018 10:04 AM  Other:  10/18/2018 10:04 AM  Other: 10/18/2018 10:04 AM    Scribe for Treatment Team: Marylee Floras, [place] 10/18/2018 10:04 AM
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
## 2018-10-18 NOTE — Progress Notes (Signed)
Patient did not attend wrap up group. 

## 2018-10-18 NOTE — Progress Notes (Signed)
D:  Patient's self inventory sheet, patient has poor sleep, no sleep medication.  Poor appetite, low energy level, poor concentration.  Rated depression and anxiety 9, hopeless 8.  Denied withdrawals.  Denied SI.  Denied physical problems.  Denied physical pain.  Goal is see MD and start meds.  No discharge plans. A:  Medications administered per MD orders.  Emotional support and encouragement given patient. R:  Denied SI and HI, contracts for safety.  Denied A/V hallucinations.  Safety maintained in 15 minute checks.
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
## 2018-10-18 NOTE — Tx Team (Signed)
Initial Treatment Plan 10/18/2018 5:44 AM [name] [id number]    PATIENT STRESSORS: Financial difficulties Marital or family conflict Medication change or noncompliance Occupational concerns   PATIENT STRENGTHS: Active sense of humor Average or above average intelligence Communication skills Motivation for treatment/growth   PATIENT IDENTIFIED PROBLEMS: Depression  Suicidal Ideation  Anxiety  Anger Management     "Develop better coping skills"  "get on the right medication"         DISCHARGE CRITERIA:  Improved stabilization in mood, thinking, and/or behavior Motivation to continue treatment in a less acute level of care Need for constant or close observation no longer present Verbal commitment to aftercare and medication compliance  PRELIMINARY DISCHARGE PLAN: Outpatient therapy Participate in family therapy Return to previous living arrangement  PATIENT/FAMILY INVOLVEMENT: This treatment plan has been presented to and reviewed with the patient, [name]  The patient and family have been given the opportunity to ask questions and make suggestions.  Margaretann Loveless, RN 10/18/2018, 5:44 AM

## 2018-10-19 ENCOUNTER — Telehealth: Payer: Self-pay | Admitting: Family Medicine

## 2018-10-19 LAB — BASIC METABOLIC PANEL
Anion gap: 10 (ref 5–15)
BUN: 15 mg/dL (ref 6–20)
CO2: 25 mmol/L (ref 22–32)
Calcium: 9.4 mg/dL (ref 8.9–10.3)
Chloride: 104 mmol/L (ref 98–111)
Creatinine, Ser: 0.77 mg/dL (ref 0.44–1.00)
GFR calc Af Amer: >60 mL/min (ref >60)
GFR calc non Af Amer: >60 mL/min (ref >60)
Glucose, Bld: 80 mg/dL (ref 70–99)
Potassium: 3.9 mmol/L (ref 3.5–5.1)
Sodium: 139 mmol/L (ref 135–145)

## 2018-10-19 LAB — LIPID PANEL
Cholesterol: 191 mg/dL (ref 0–200)
HDL: 56 mg/dL (ref >40)
LDL Cholesterol: 119 mg/dL — ABNORMAL HIGH (ref 0–99)
Total CHOL/HDL Ratio: 3.4 RATIO
Triglycerides: 82 mg/dL (ref <150)
VLDL: 16 mg/dL (ref 0–40)

## 2018-10-19 LAB — MAGNESIUM: Magnesium: 2.1 mg/dL (ref 1.7–2.4)

## 2018-10-19 LAB — TSH: TSH: 0.615 u[IU]/mL (ref 0.350–4.500)

## 2018-10-19 MED ORDER — ONDANSETRON HCL 4 MG PO TABS
4.0000 mg | ORAL_TABLET | Freq: Three times a day (TID) | ORAL | Status: DC | PRN
  Administered 2018-10-20: 09:00:00 4 mg via ORAL
  Filled 2018-10-19: qty 1

## 2018-10-19 MED ORDER — TRAZODONE HCL 50 MG PO TABS
50.0000 mg | ORAL_TABLET | Freq: Every evening | ORAL | Status: DC | PRN
  Administered 2018-10-19: 50 mg via ORAL
  Filled 2018-10-19: qty 1

## 2018-10-19 NOTE — Progress Notes (Signed)
[facility]  10/19/2018 11:12 AM [name]  MRN:  [id number] Subjective: Patient is a 23 year old female with a past psychiatric history significant for bipolar disorder, PTSD as well as anxiety who presented on 9/21 after an intentional overdose of Benadryl.  Objective: Patient is seen and examined.  Patient is a 23 year old female with the above-stated past psychiatric history who is seen in follow-up.  On admission she was started on Latuda.  She stated she had previously had Latuda, but it had to be stopped previously.  She could not recall exactly but she was concerned that it was because of the expense of the drug.  She stated she is now on Medicaid and that would not be a problem.  She does state that she is mildly nauseated today, and fatigued.  She is unclear on whether or not that may be secondary to the JordanLatuda.  She also continues on lorazepam and trazodone.  Review of her laboratories revealed essentially normal electrolytes, negative pregnancy test, normal TSH.  She denied any suicidal ideation.  She does stated she felt fatigued.  She also complained of some mild nausea.  Her vital signs are stable, she is afebrile.  Her CIWA this morning was only 1.  She slept 6.75 hours last night.  Principal Problem: <principal problem not specified> Diagnosis: Active Problems:   Bipolar 1 disorder, depressed, severe (HCC)  Total Time spent with patient: 20 minutes  Past Psychiatric History: See admission H&P  Past Medical History:  Past Medical History:  Diagnosis Date  . Allergic conjunctivitis   . Allergy   . Anxiety 06/04/2011   panic attacks  . Astigmatism    glasses  . Bipolar 1 disorder (HCC)   . Borderline personality disorder (HCC)   . Depression   . Obesity   . Post traumatic stress disorder   . Urinary tract infection 4/95   re[facility], [facility]    Past Surgical History:  Procedure Laterality Date  . INGUINAL HERNIA REPAIR  7/95   bilat exploration,  left repair  . UMBILICAL HERNIA REPAIR  7/95   repaired as infant   Family History:  Family History  Problem Relation Age of Onset  . Hypertension Mother   . Hypothyroidism Mother   . Hypertension Father   . Brain cancer Maternal Grandfather   . Cancer Other    Family Psychiatric  History: See admission H&P Social History:  Social History   Substance and Sexual Activity  Alcohol Use No     Social History   Substance and Sexual Activity  Drug Use No    Social History   Socioeconomic History  . Marital status: Single    Spouse name: Not on file  . Number of children: Not on file  . Years of education: Not on file  . Highest education level: Not on file  Occupational History  . Not on file  Social Needs  . Financial resource strain: Patient refused  . Food insecurity    Worry: Patient refused    Inability: Patient refused  . Transportation needs    Medical: Patient refused    Non-medical: Patient refused  Tobacco Use  . Smoking status: Current Every Day Smoker    Packs/day: 1.00    Types: Cigarettes  . Smokeless tobacco: Never Used  Substance and Sexual Activity  . Alcohol use: No  . Drug use: No  . Sexual activity: Not Currently    Birth control/protection: Pill, Condom  Lifestyle  .  Physical activity    Days per week: Patient refused    Minutes per session: Patient refused  . Stress: Not on file  Relationships  . Social Herbalist on phone: Patient refused    Gets together: Patient refused    Attends religious service: Patient refused    Active member of club or organization: Patient refused    Attends meetings of clubs or organizations: Patient refused    Relationship status: Patient refused  Other Topics Concern  . Not on file  Social History Narrative   Graduated HS   23 years old   Living with grandmother   Moving to [place]         Additional Social History:    Pain Medications: See MAR Prescriptions: See MAR Over the  Counter: See MAR History of alcohol / drug use?: Yes Name of Substance 1: Cannabis 1 - Age of First Use: teens 1 - Amount (size/oz): varies 1 - Frequency: weekly 1 - Duration: ongoing 1 - Last Use / Amount: 10/15/18                  Sleep: Good  Appetite:  Fair  Current Medications: Current Facility-Administered Medications  Medication Dose Route Frequency Provider Last Rate Last Dose  . acetaminophen (TYLENOL) tablet 650 mg  650 mg Oral Q6H PRN Lindon Romp A, NP   650 mg at 10/19/18 0640  . alum & mag hydroxide-simeth (MAALOX/MYLANTA) 200-200-20 MG/5ML suspension 30 mL  30 mL Oral Q4H PRN Lindon Romp A, NP      . feeding supplement (ENSURE ENLIVE) (ENSURE ENLIVE) liquid 237 mL  237 mL Oral BID BM Cobos, Myer Peer, MD   237 mL at 10/18/18 1100  . influenza vac split quadrivalent PF (FLUARIX) injection 0.5 mL  0.5 mL Intramuscular Tomorrow-1000 Cobos, Fernando A, MD      . LORazepam (ATIVAN) tablet 0.5 mg  0.5 mg Oral Q6H PRN Cobos, Myer Peer, MD   0.5 mg at 10/19/18 0640  . lurasidone (LATUDA) tablet 20 mg  20 mg Oral Q breakfast Cobos, Myer Peer, MD   20 mg at 10/19/18 0828  . magnesium hydroxide (MILK OF MAGNESIA) suspension 30 mL  30 mL Oral Daily PRN Lindon Romp A, NP      . multivitamin with minerals tablet 1 tablet  1 tablet Oral Daily Cobos, Myer Peer, MD   1 tablet at 10/19/18 0829  . nicotine polacrilex (NICORETTE) gum 2 mg  2 mg Oral PRN Cobos, Myer Peer, MD   2 mg at 10/19/18 0640  . ondansetron (ZOFRAN) tablet 4 mg  4 mg Oral Q8H PRN Sharma Covert, MD      . traZODone (DESYREL) tablet 50 mg  50 mg Oral QHS PRN Sharma Covert, MD        Lab Results:  Results for orders placed or performed during the hospital encounter of 10/18/18 (from the past 48 hour(s))  Pregnancy, urine     Status: None   Collection Time: 10/18/18 11:54 AM  Result Value Ref Range   Preg Test, Ur NEGATIVE NEGATIVE    Comment:        THE SENSITIVITY OF THIS METHODOLOGY IS >20  mIU/mL. Performed at [facility], [place] [address], [place], [place] [address]   Lipid panel     Status: Abnormal   Collection Time: 10/19/18  6:32 AM  Result Value Ref Range   Cholesterol 191 0 - 200 mg/dL  Triglycerides 82 <150 mg/dL   HDL 56 >89 mg/dL   Total CHOL/HDL Ratio 3.4 RATIO   VLDL 16 0 - 40 mg/dL   LDL Cholesterol 211 (H) 0 - 99 mg/dL    Comment:        Total Cholesterol/HDL:CHD Risk Coronary Heart Disease Risk Table                     Men   Women  1/2 Average Risk   3.4   3.3  Average Risk       5.0   4.4  2 X Average Risk   9.6   7.1  3 X Average Risk  23.4   11.0        Use the calculated Patient Ratio above and the CHD Risk Table to determine the patient's CHD Risk.        ATP III CLASSIFICATION (LDL):  <100     mg/dL   Optimal  941-740  mg/dL   Near or Above                    Optimal  130-159  mg/dL   Borderline  814-481  mg/dL   High  >856     mg/dL   Very High Performed at [facility], [address], [place], [place] [address]   TSH     Status: None   Collection Time: 10/19/18  6:32 AM  Result Value Ref Range   TSH 0.615 0.350 - 4.500 uIU/mL    Comment: Performed by a 3rd Generation assay with a functional sensitivity of <=0.01 uIU/mL. Performed at [facility], [address] [place], [place] [address]   Basic metabolic panel     Status: None   Collection Time: 10/19/18  6:32 AM  Result Value Ref Range   Sodium 139 135 - 145 mmol/L   Potassium 3.9 3.5 - 5.1 mmol/L   Chloride 104 98 - 111 mmol/L   CO2 25 22 - 32 mmol/L   Glucose, Bld 80 70 - 99 mg/dL   BUN 15 6 - 20 mg/dL   Creatinine, Ser 8.58 0.44 - 1.00 mg/dL   Calcium 9.4 8.9 - 85.0 mg/dL   GFR calc non Af Amer >60 >60 mL/min   GFR calc Af Amer >60 >60 mL/min   Anion gap 10 5 - 15    Comment: Performed at [facility], [address], [place], [place] [address]  Magnesium     Status: None   Collection Time:  10/19/18  6:32 AM  Result Value Ref Range   Magnesium 2.1 1.7 - 2.4 mg/dL    Comment: Performed at [facility], [address], [place], [place] [address]    Blood Alcohol level:  Lab Results  Component Value Date   [facility] <11 05/08/2011   ETH <11 08/31/2010    Metabolic Disorder Labs: Lab Results  Component Value Date   HGBA1C 4.9 11/07/2016   MPG 93.93 11/07/2016   MPG 108 07/10/2007   No results found for: PROLACTIN Lab Results  Component Value Date   CHOL 191 10/19/2018   TRIG 82 10/19/2018   HDL 56 10/19/2018   CHOLHDL 3.4 10/19/2018   VLDL 16 10/19/2018   LDLCALC 119 (H) 10/19/2018   LDLCALC 96 11/07/2016    Physical Findings: AIMS: Facial and Oral Movements Muscles of Facial Expression: None, normal Lips and Perioral Area: None, normal Jaw: None, normal Tongue:  None, normal,Extremity Movements Upper (arms, wrists, hands, fingers): None, normal Lower (legs, knees, ankles, toes): None, normal, Trunk Movements Neck, shoulders, hips: None, normal, Overall Severity Severity of abnormal movements (highest score from questions above): None, normal Incapacitation due to abnormal movements: None, normal Patient's awareness of abnormal movements (rate only patient's report): No Awareness, Dental Status Current problems with teeth and/or dentures?: No Does patient usually wear dentures?: No  CIWA:  CIWA-Ar Total: 1 COWS:  COWS Total Score: 1  Musculoskeletal: Strength & Muscle Tone: within normal limits Gait & Station: normal Patient leans: N/A  Psychiatric Specialty Exam: Physical Exam  Nursing note and vitals reviewed. Constitutional: She is oriented to person, place, and time. She appears well-developed and well-nourished.  HENT:  Head: Normocephalic and atraumatic.  Respiratory: Effort normal.  Neurological: She is alert and oriented to person, place, and time.    ROS  Blood pressure 106/70, pulse 76, temperature 97.6 F (36.4 C),  temperature source Oral, resp. rate 16, height 5\' 5"  (1.651 m), weight 83 kg, last menstrual period 10/18/2018, SpO2 100 %, unknown if currently breastfeeding.Body mass index is 30.45 kg/m.  General Appearance: Disheveled  Eye Contact:  Fair  Speech:  Normal Rate  Volume:  Decreased  Mood:  Depressed  Affect:  Congruent  Thought Process:  Coherent and Descriptions of Associations: Intact  Orientation:  Full (Time, Place, and Person)  Thought Content:  Logical  Suicidal Thoughts:  No  Homicidal Thoughts:  No  Memory:  Immediate;   Fair Recent;   Fair Remote;   Fair  Judgement:  Intact  Insight:  Fair  Psychomotor Activity:  Decreased  Concentration:  Concentration: Fair and Attention Span: Fair  Recall:  10/20/2018 of Knowledge:  Fair  Language:  Fair  Akathisia:  Negative  Handed:  Right  AIMS (if indicated):     Assets:  Desire for Improvement Resilience  ADL's:  Intact  Cognition:  WNL  Sleep:  Number of Hours: 6.75     Treatment Plan Summary: Daily contact with patient to assess and evaluate symptoms and progress in treatment, Medication management and Plan : Patient is seen and examined.  Patient is a 23 year old female with the above-stated past psychiatric history who is seen in follow-up.   Diagnosis: #1 bipolar disorder, most recently depressed, severe without psychotic features, #2 cannabis use disorder  Patient is seen in follow-up.  She is essentially unchanged from what sounds like on admission.  She is having some nausea this morning.  We discussed that it may be relevant with regard to the Latuda.  She stated that prior to admission she had been having problems with nausea and decreased appetite.  I added Zofran this morning for nausea, and we will ensure that she is tolerating low the Latuda.  No change that dose is today, we may need to switch to something else if it continues to lead to nausea.  We will continue the as needed lorazepam for now.  No other  changes except for the addition of the Zofran. 1.  Continue lorazepam 0.5 mg p.o. every 6 hours as needed anxiety or sleep. 2.  Continue Latuda 20 mg p.o. daily with food for mood stability. 3.  Add Zofran 4 mg p.o. every 8 hours as needed nausea. 4.  Continue trazodone 50 mg p.o. nightly as needed insomnia. 5.  Disposition planning-in progress.  22, MD 10/19/2018, 11:12 AM

## 2018-10-19 NOTE — [facility] Group Notes (Signed)
[facility] Group Notes:  (Nursing/MHT/Case Management/Adjunct)  Date:  10/19/2018  Time:  1:30 PM  Type of Therapy:  Nurse Education  Participation Level:  Active  Participation Quality:  Appropriate, Attentive, Sharing and Supportive  Affect:  Anxious and Appropriate  Cognitive:  Alert and Appropriate  Insight:  Appropriate, Good and Improving  Engagement in Group:  Developing/Improving, Engaged, Improving and Supportive  Modes of Intervention:  Discussion, Education, Exploration, Socialization and Support  Summary of Progress/Problems: Pt's discussed what mindfulness meant to them and then compared this to the formal definition. Pt's then discussed how they had utilized mindfulness in the past if they were aware of the practice. Pt's finally discussed how they could use mindfulness after they are discharged to combat the stressors that are aggravating their crisis.  Pt shared multiple stressors in their life. Pt stated mindfulness could be practiced with aiding in their relationship issues and raising children.   Otelia Limes Saben Donigan 10/19/2018, 3:13 PM

## 2018-10-19 NOTE — Progress Notes (Signed)
Adult Psychoeducational Group Note  Date:  10/19/2018 Time:  10:30 PM  Group Topic/Focus:  Wrap-Up Group:   The focus of this group is to help patients review their daily goal of treatment and discuss progress on daily workbooks.  Participation Level:  Active  Participation Quality:  Appropriate  Affect:  Appropriate  Cognitive:  Appropriate  Insight: Appropriate  Engagement in Group:  Engaged  Modes of Intervention:  Discussion  Additional Comments:  Patient attended group and said that her day was a 5. Her coping skills were taking showers and talking to friends on the phone.   Seanne Chirico W Jimma Ortman [id number]/[id number], 10:30 PM

## 2018-10-19 NOTE — Telephone Encounter (Signed)
Attempted to call patient about her appointment on 9/23 @ 10:35. No answer left voicemail instructing patient to wear a face mask for the entire appointment and no visitors are allowed during the visit. Patient instructed not to attend the appointment if she was any symptoms. Symptom list and office number left.

## 2018-10-19 NOTE — Telephone Encounter (Signed)
Attempted to call patient to let her know that we have to move her appointment due to the provider being sick. No answer, left voicemail instructing patient that we have cancelled the appointment and we will be giving her a call back with a new appointment.  °

## 2018-10-19 NOTE — [facility] Group Notes (Signed)
LCSW Group Therapy Note 10/19/2018 1:34 PM  Type of Therapy/Topic: Group Therapy: Feelings about Diagnosis  Participation Level: Did Not Attend   Description of Group:  This group will allow patients to explore their thoughts and feelings about diagnoses they have received. Patients will be guided to explore their level of understanding and acceptance of these diagnoses. Facilitator will encourage patients to process their thoughts and feelings about the reactions of others to their diagnosis and will guide patients in identifying ways to discuss their diagnosis with significant others in their lives. This group will be process-oriented, with patients participating in exploration of their own experiences, giving and receiving support, and processing challenge from other group members.  Therapeutic Goals: 1. Patient will demonstrate understanding of diagnosis as evidenced by identifying two or more symptoms of the disorder 2. Patient will be able to express two feelings regarding the diagnosis 3. Patient will demonstrate their ability to communicate their needs through discussion and/or role play  Summary of Patient Progress:  Invited, chose not to attend.     Therapeutic Modalities:  Cognitive Behavioral Therapy Brief Therapy Feelings Identification    Doff[place] Clinical Social Worker

## 2018-10-19 NOTE — Progress Notes (Signed)
D: Pt approached the writer stating that she was "anxious and they were in there trying to help her hold it together", referring to her peers in the dayroom. When asked why she was anxious, pt stated, "my boyfriend. He has my daughter while I'm in here and I usually have her 24 hours". Pt sted she has to communicate with him because of that. When asked where she plans to live after discharge pt stated, she'll be living with him. When asked how she plans to manage her anxiety after discharge, pt stated, "I go outside and smoke a cig". Denies all  A:  Gave prn ativan as requested. Support and encouragement was offered. 15 min checks continued for safety.  R: Pt remains safe.

## 2018-10-20 ENCOUNTER — Ambulatory Visit: Payer: Medicaid Other | Admitting: Family Medicine

## 2018-10-20 LAB — HEMOGLOBIN A1C
Hgb A1c MFr Bld: 5 % (ref 4.8–5.6)
Mean Plasma Glucose: 97 mg/dL

## 2018-10-20 MED ORDER — NICOTINE POLACRILEX 2 MG MT GUM: 2.0000 mg | CHEWING_GUM | OROMUCOSAL | 0 refills | Status: DC | PRN

## 2018-10-20 MED ORDER — LURASIDONE HCL 20 MG PO TABS: 20.0000 mg | ORAL_TABLET | Freq: Every day | ORAL | 0 refills | Status: DC

## 2018-10-20 MED ORDER — TRAZODONE HCL 50 MG PO TABS: 50.0000 mg | ORAL_TABLET | Freq: Every evening | ORAL | 0 refills | Status: DC | PRN

## 2018-10-20 MED ORDER — BUSPIRONE HCL 10 MG PO TABS: 10.0000 mg | ORAL_TABLET | Freq: Three times a day (TID) | ORAL | 0 refills | Status: DC

## 2018-10-20 MED ORDER — BUSPIRONE HCL 10 MG PO TABS
10.0000 mg | ORAL_TABLET | Freq: Three times a day (TID) | ORAL | Status: DC
  Administered 2018-10-20: 10 mg via ORAL
  Filled 2018-10-20 (×6): qty 1

## 2018-10-20 NOTE — B[facility] Group Notes (Signed)
Occupational Therapy Group Note  Date:  10/20/2018 Time:  11:34 AM  Group Topic/Focus:  Self Esteem Action Plan:   The focus of this group is to help patients create a plan to continue to build self-esteem after discharge.  Participation Level:  Active  Participation Quality:  Appropriate  Affect:  Blunted  Cognitive:  Appropriate  Insight: Improving  Engagement in Group:  Engaged  Modes of Intervention:  Activity, Discussion, Education and Socialization  Additional Comments:    S: my self esteem is low since having my baby  O: Education given on self esteem and its implications with mental health conditions. Pt to engage in discussion on negative vs positive self esteem factors. Positive thinking (A-Z) activity then given for pt to complete at end of session.  A: Pt appropriately engaged in negative vs positive implications of self esteem discussion. She completed A-Z activity with mod VC's and support from peers. She states her self esteem is very low but she is dedicated to improving it.  P: OT group will be x1 per week while pt inpatient   Zenovia Jarred, MSOT, OTR/[place] Office: [place] 10/20/2018, 11:34 AM
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
## 2018-10-20 NOTE — BHH Suicide Risk Assessment (Signed)
[facility] Discharge Suicide Risk Assessment   Principal Problem: <principal problem not specified> Discharge Diagnoses: Active Problems:   Bipolar 1 disorder, depressed, severe (H[facility])   Total Time spent with patient: 20 minutes  Musculoskeletal: Strength & Muscle Tone: within normal limits Gait & Station: normal Patient leans: N/A  Psychiatric Specialty Exam: Review of Systems  All other systems reviewed and are negative.   Blood pressure 106/70, pulse 76, temperature 97.6 F (36.4 C), temperature source Oral, resp. rate 16, height 5\' 5"  (1.651 m), weight 83 kg, last menstrual period 10/18/2018, SpO2 100 %, unknown if currently breastfeeding.Body mass index is 30.45 kg/m.  General Appearance: Casual  Eye Contact::  Fair  Speech:  Normal Rate409  Volume:  Normal  Mood:  Anxious  Affect:  Congruent  Thought Process:  Coherent and Descriptions of Associations: Intact  Orientation:  Full (Time, Place, and Person)  Thought Content:  Logical  Suicidal Thoughts:  No  Homicidal Thoughts:  No  Memory:  Immediate;   Fair Recent;   Fair Remote;   Fair  Judgement:  Intact  Insight:  Fair  Psychomotor Activity:  Increased  Concentration:  Good  Recall:  Good  Fund of Knowledge:Good  Language: Good  Akathisia:  Negative  Handed:  Right  AIMS (if indicated):     Assets:  Desire for Improvement Resilience  Sleep:  Number of Hours: 6.75  Cognition: WNL  ADL's:  Intact   Mental Status Per Nursing Assessment::   On Admission:  Suicidal ideation indicated by patient, Suicidal ideation indicated by others, Self-harm behaviors, Suicide plan, Self-harm thoughts  Demographic Factors:  Adolescent or young adult, Caucasian and Low socioeconomic status  Loss Factors: NA  Historical Factors: Impulsivity  Risk Reduction Factors:   Sense of responsibility to family and Living with another person, especially a relative  Continued Clinical Symptoms:  Bipolar Disorder:   Depressive  phase Personality Disorders:   Cluster B  Cognitive Features That Contribute To Risk:  None    Suicide Risk:  Minimal: No identifiable suicidal ideation.  Patients presenting with no risk factors but with morbid ruminations; may be classified as minimal risk based on the severity of the depressive symptoms  Follow-up [place], [facility] Follow up on 10/22/2018.   Why: Hospital discharge appointment is Friday, 9/25 at 9:45a.  Please bring your photo ID, insurance card, SSN and current medications.  Contact information: [place] [address] [phone]           Plan Of Care/Follow-up recommendations:  Activity:  ad lib  Sharma Covert, MD 10/20/2018, 10:10 AM

## 2018-10-20 NOTE — Progress Notes (Signed)
Patient ID: [name], female   DOB: 25-Oct-1995, 25 y.o.   MRN: [id number] D: Patient calm, cooperative, denies SI/HI/AVH, observed in the day room earlier in shift interacting with his peers.  Pt pleasant, pt denied being in any acute distress, and denied any concerns earlier in shift.  A: Pt given all of his meds as ordered, and slept through the night with no concerns noted.  R: Will continue to monitor on Q15 minute checks.

## 2018-10-20 NOTE — Progress Notes (Signed)
  [facility] Adult Case Management Discharge Plan :  Will you be returning to the same living situation after discharge:  Yes,  home At discharge, do you have transportation home?: Yes,  family picking up Do you have the ability to pay for your medications: Yes,  has Medicaid.  Release of information consent forms completed and in the chart; letter on chart.  Patient to Follow up at: Follow-up [place], [facility] Follow up on 10/22/2018.   Why: Hospital discharge appointment is Friday, 9/25 at 9:45a.  Please bring your photo ID, insurance card, SSN and current medications.  Contact information: [place] [address] [phone]           Next level of care provider has access to [place] and Suicide Prevention discussed: Yes,  with patient. Patient declined consents.  Have you used any form of tobacco in the last 30 days? (Cigarettes, Smokeless Tobacco, Cigars, and/or Pipes): Yes  Has patient been referred to the Quitline?: Patient refused referral  Patient has been referred for addiction treatment: Yes  Joellen Jersey, [place] 10/20/2018, 9:23 AM

## 2018-10-20 NOTE — Progress Notes (Signed)
Pt discharged to lobby. Pt was stable and appreciative at that time. All papers and prescriptions were given and valuables returned. Verbal understanding expressed. Denies SI/HI and A/VH. Pt given opportunity to express concerns and ask questions.  

## 2018-11-11 ENCOUNTER — Ambulatory Visit: Payer: Self-pay | Admitting: Medical

## 2018-11-22 ENCOUNTER — Encounter: Payer: Self-pay | Admitting: Family Medicine

## 2018-11-22 ENCOUNTER — Ambulatory Visit: Payer: Self-pay | Admitting: Women's Health

## 2019-08-31 ENCOUNTER — Other Ambulatory Visit: Payer: Self-pay

## 2019-08-31 ENCOUNTER — Encounter (HOSPITAL_COMMUNITY): Payer: Self-pay

## 2019-08-31 ENCOUNTER — Ambulatory Visit (HOSPITAL_COMMUNITY)
Admission: EM | Admit: 2019-08-31 | Discharge: 2019-08-31 | Disposition: A | Discharge status: Home / Self Care | Payer: Medicaid Other | Attending: Family Medicine | Admitting: Family Medicine

## 2019-08-31 DIAGNOSIS — Z20822 Contact with and (suspected) exposure to covid-19: Secondary | ICD-10-CM | POA: Insufficient documentation

## 2019-08-31 LAB — SARS CORONAVIRUS 2 (TAT 6-24 HRS): SARS Coronavirus 2: NEGATIVE

## 2019-08-31 NOTE — Discharge Instructions (Addendum)
You have been tested for COVID-19 today. °If your test returns positive, you will receive a phone call from [facility] regarding your results. °Negative test results are not called. °Both positive and negative results area always visible on MyChart. °If you do not have a MyChart account, sign up instructions are provided in your discharge papers. °Please do not hesitate to contact us should you have questions or concerns. ° °

## 2019-08-31 NOTE — ED Triage Notes (Signed)
Pt presents to UC for covid exposure. Pt also has cough, chills, subjective fever, runny nose, fatigue, dizziness, and headache.   Pt requesting covid testing.

## 2019-09-01 NOTE — ED Provider Notes (Addendum)
[facility]   [id number] 08/31/19 Arrival Time: 1610  ASSESSMENT & PLAN:  1. Exposure to COVID-19 virus      COVID-19 testing sent. See letter/work note on file for self-isolation guidelines. COVID vaccine counseling; pre-contemplative. OTC symptom care as needed.   Follow-up Information    [facility].   Specialty: Urgent Care Why: As needed. Contact information: [address] [place] [address] [phone]              Reviewed expectations re: course of current medical issues. Questions answered. Outlined signs and symptoms indicating need for more acute intervention. Understanding verbalized. After Visit Summary given.   SUBJECTIVE: History from: patient. [name] is a 24 y.o. female who requests COVID-19 testing. Known COVID-19 contact: reports recent exposure. Recent travel: none. Reports: mild coughing, subj fever, nasal congestion, fatigue. Denies: difficulty breathing. Normal PO intake without n/v/d.    OBJECTIVE:  Vitals:   08/31/19 1717  BP: 100/69  Pulse: 79  Resp: 16  Temp: 99.2 F (37.3 C)  TempSrc: Oral  SpO2: 100%    General appearance: alert; no distress Eyes: PERRLA; EOMI; conjunctiva normal HENT: [place]; AT; nasal congestion Neck: supple  Lungs: speaks full sentences without difficulty; unlabored; dry cough Extremities: no edema Skin: warm and dry Neurologic: normal gait Psychological: alert and cooperative; normal mood and affect  Labs:  Labs Reviewed  SARS CORONAVIRUS 2 (TAT 6-24 HRS)     Allergies  Allergen Reactions  . Penicillins Anaphylaxis and Hives    Did it involve swelling of the face/tongue/throat, SOB, or low BP? yes Did it involve sudden or severe rash/hives, skin peeling, or any reaction on the inside of your mouth or nose? yes Did you need to seek medical attention at a hospital or doctor's office? yes When did it last happen?6 years ago If all above  answers are "NO", may proceed with cephalosporin use.   . Latex Hives and Itching    Past Medical History:  Diagnosis Date  . Allergic conjunctivitis   . Allergy   . Anxiety 06/04/2011   panic attacks  . Astigmatism    glasses  . Bipolar 1 disorder (HCC)   . Borderline personality disorder (HCC)   . Depression   . Obesity   . Post traumatic stress disorder   . Urinary tract infection 04-23-93   recurrent, nl [facility], [facility])   Social History   Socioeconomic History  . Marital status: Single    Spouse name: Not on file  . Number of children: Not on file  . Years of education: Not on file  . Highest education level: Not on file  Occupational History  . Not on file  Tobacco Use  . Smoking status: Current Every Day Smoker    Packs/day: 1.00    Types: Cigarettes  . Smokeless tobacco: Never Used  Vaping Use  . Vaping Use: Never used  Substance and Sexual Activity  . Alcohol use: No  . Drug use: No  . Sexual activity: Not Currently    Birth control/protection: Pill, Condom  Other Topics Concern  . Not on file  Social History Narrative   Graduated HS   24 years old   Living with grandmother   Moving to [place]         Social Determinants of Health   Financial Resource Strain: Unknown  . Difficulty of Paying Living Expenses: Patient refused  Food Insecurity: Unknown  . Worried About Running  Out of Food in the Last Year: Patient refused  . Ran Out of Food in the Last Year: Patient refused  Transportation Needs: Unknown  . Lack of Transportation (Medical): Patient refused  . Lack of Transportation (Non-Medical): Patient refused  Physical Activity: Unknown  . Days of Exercise per Week: Patient refused  . Minutes of Exercise per Session: Patient refused  Stress:   . Feeling of Stress :   Social Connections: Unknown  . Frequency of Communication with Friends and Family: Patient refused  . Frequency of Social Gatherings with Friends and Family: Patient refused  .  Attends Religious Services: Patient refused  . Active Member of Clubs or Organizations: Patient refused  . Attends Banker Meetings: Patient refused  . Marital Status: Patient refused  Intimate Partner Violence: Unknown  . Fear of Current or Ex-Partner: Patient refused  . Emotionally Abused: Patient refused  . Physically Abused: Patient refused  . Sexually Abused: Patient refused   Family History  Problem Relation Age of Onset  . Hypertension Mother   . Hypothyroidism Mother   . Hypertension Father   . Brain cancer Maternal Grandfather   . Cancer Other    Past Surgical History:  Procedure Laterality Date  . INGUINAL HERNIA REPAIR  7/95   bilat exploration, left repair  . UMBILICAL HERNIA REPAIR  7/95   repaired as infant     Mardella Layman, MD 09/01/19 [id number]    Mardella Layman, MD 09/01/19 [phone]

## 2020-04-14 ENCOUNTER — Emergency Department: Payer: Medicaid Other

## 2020-04-14 ENCOUNTER — Emergency Department
Admission: EM | Admit: 2020-04-14 | Discharge: 2020-04-14 | Disposition: A | Discharge status: Home / Self Care | Payer: Medicaid Other | Attending: Emergency Medicine | Admitting: Emergency Medicine

## 2020-04-14 ENCOUNTER — Other Ambulatory Visit: Payer: Self-pay

## 2020-04-14 DIAGNOSIS — Y92410 Unspecified street and highway as the place of occurrence of the external cause: Secondary | ICD-10-CM | POA: Insufficient documentation

## 2020-04-14 DIAGNOSIS — M545 Low back pain, unspecified: Secondary | ICD-10-CM | POA: Insufficient documentation

## 2020-04-14 DIAGNOSIS — F17210 Nicotine dependence, cigarettes, uncomplicated: Secondary | ICD-10-CM | POA: Diagnosis not present

## 2020-04-14 DIAGNOSIS — Z91040 Latex allergy status: Secondary | ICD-10-CM | POA: Diagnosis not present

## 2020-04-14 DIAGNOSIS — M25511 Pain in right shoulder: Secondary | ICD-10-CM | POA: Insufficient documentation

## 2020-04-14 MED ORDER — NAPROXEN 500 MG PO TABS
500.0000 mg | ORAL_TABLET | Freq: Once | ORAL | Status: AC
  Administered 2020-04-14: 500 mg via ORAL
  Filled 2020-04-14: qty 1

## 2020-04-14 MED ORDER — ACETAMINOPHEN 500 MG PO TABS
1000.0000 mg | ORAL_TABLET | Freq: Once | ORAL | Status: AC
  Administered 2020-04-14: 1000 mg via ORAL
  Filled 2020-04-14: qty 2
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
## 2020-10-08 ENCOUNTER — Emergency Department: Payer: Medicaid Other

## 2020-10-08 ENCOUNTER — Emergency Department
Admission: EM | Admit: 2020-10-08 | Discharge: 2020-10-08 | Disposition: A | Discharge status: Home / Self Care | Payer: Medicaid Other | Attending: Emergency Medicine | Admitting: Emergency Medicine

## 2020-10-08 ENCOUNTER — Other Ambulatory Visit: Payer: Self-pay

## 2020-10-08 DIAGNOSIS — R1031 Right lower quadrant pain: Secondary | ICD-10-CM

## 2020-10-08 DIAGNOSIS — S39011A Strain of muscle, fascia and tendon of abdomen, initial encounter: Secondary | ICD-10-CM | POA: Insufficient documentation

## 2020-10-08 DIAGNOSIS — Y990 Civilian activity done for income or pay: Secondary | ICD-10-CM | POA: Diagnosis not present

## 2020-10-08 DIAGNOSIS — Z91040 Latex allergy status: Secondary | ICD-10-CM | POA: Diagnosis not present

## 2020-10-08 DIAGNOSIS — F17210 Nicotine dependence, cigarettes, uncomplicated: Secondary | ICD-10-CM | POA: Diagnosis not present

## 2020-10-08 DIAGNOSIS — X500XXA Overexertion from strenuous movement or load, initial encounter: Secondary | ICD-10-CM | POA: Diagnosis not present

## 2020-10-08 DIAGNOSIS — T148XXA Other injury of unspecified body region, initial encounter: Secondary | ICD-10-CM

## 2020-10-08 DIAGNOSIS — S3991XA Unspecified injury of abdomen, initial encounter: Secondary | ICD-10-CM | POA: Diagnosis present

## 2020-10-08 LAB — BASIC METABOLIC PANEL
Anion gap: 8 (ref 5–15)
BUN: 17 mg/dL (ref 6–20)
CO2: 26 mmol/L (ref 22–32)
Calcium: 9.3 mg/dL (ref 8.9–10.3)
Chloride: 102 mmol/L (ref 98–111)
Creatinine, Ser: 0.66 mg/dL (ref 0.44–1.00)
GFR, Estimated: >60 mL/min (ref >60)
Glucose, Bld: 89 mg/dL (ref 70–99)
Potassium: 4.7 mmol/L (ref 3.5–5.1)
Sodium: 136 mmol/L (ref 135–145)

## 2020-10-08 LAB — CBC
HCT: 41.9 % (ref 36.0–46.0)
Hemoglobin: 14.2 g/dL (ref 12.0–15.0)
MCH: 31.9 pg (ref 26.0–34.0)
MCHC: 33.9 g/dL (ref 30.0–36.0)
MCV: 94.2 fL (ref 80.0–100.0)
Platelets: 296 10*3/uL (ref 150–400)
RBC: 4.45 MIL/uL (ref 3.87–5.11)
RDW: 13.2 % (ref 11.5–15.5)
WBC: 8.7 10*3/uL (ref 4.0–10.5)
nRBC: 0 % (ref 0.0–0.2)

## 2020-10-08 LAB — URINALYSIS, COMPLETE (UACMP) WITH MICROSCOPIC
Bilirubin Urine: NEGATIVE
Glucose, UA: NEGATIVE mg/dL
Hgb urine dipstick: NEGATIVE
Ketones, ur: NEGATIVE mg/dL
Leukocytes,Ua: NEGATIVE
Nitrite: NEGATIVE
Protein, ur: NEGATIVE mg/dL
Specific Gravity, Urine: 1.015 (ref 1.005–1.030)
pH: 6 (ref 5.0–8.0)

## 2020-10-08 LAB — POC URINE PREG, ED: Preg Test, Ur: NEGATIVE

## 2020-10-08 MED ORDER — MELOXICAM 15 MG PO TABS: 15.0000 mg | ORAL_TABLET | Freq: Every day | ORAL | 0 refills | Status: DC

## 2020-10-08 MED ORDER — IOHEXOL 350 MG/ML SOLN
80.0000 mL | Freq: Once | INTRAVENOUS | Status: AC | PRN
  Administered 2020-10-08: 80 mL via INTRAVENOUS

## 2020-10-08 MED ORDER — BACLOFEN 10 MG PO TABS: 10.0000 mg | ORAL_TABLET | Freq: Three times a day (TID) | ORAL | 0 refills | Status: AC

## 2020-10-08 NOTE — Discharge Instructions (Addendum)
Follow-up with your regular doctor if not improving in 2 to 3 days.  Return emergency department worsening.  Take your medications as prescribed 

## 2020-10-08 NOTE — ED Triage Notes (Signed)
Pt to ED for right flank pain radiating to lower abd pain for past few days. Denies burning with urination or fevers
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
## 2020-12-10 ENCOUNTER — Other Ambulatory Visit: Payer: Self-pay

## 2020-12-10 ENCOUNTER — Encounter: Payer: Self-pay | Admitting: *Deleted

## 2020-12-10 DIAGNOSIS — G43919 Migraine, unspecified, intractable, without status migrainosus: Secondary | ICD-10-CM | POA: Insufficient documentation

## 2020-12-10 DIAGNOSIS — H53149 Visual discomfort, unspecified: Secondary | ICD-10-CM | POA: Diagnosis not present

## 2020-12-10 DIAGNOSIS — R112 Nausea with vomiting, unspecified: Secondary | ICD-10-CM | POA: Insufficient documentation

## 2020-12-10 DIAGNOSIS — F17210 Nicotine dependence, cigarettes, uncomplicated: Secondary | ICD-10-CM | POA: Insufficient documentation

## 2020-12-10 DIAGNOSIS — R1030 Lower abdominal pain, unspecified: Secondary | ICD-10-CM | POA: Diagnosis present

## 2020-12-10 DIAGNOSIS — Z79899 Other long term (current) drug therapy: Secondary | ICD-10-CM | POA: Insufficient documentation

## 2020-12-10 DIAGNOSIS — N3001 Acute cystitis with hematuria: Secondary | ICD-10-CM | POA: Diagnosis not present

## 2020-12-10 LAB — CBC
HCT: 41.4 % (ref 36.0–46.0)
Hemoglobin: 13.8 g/dL (ref 12.0–15.0)
MCH: 31.2 pg (ref 26.0–34.0)
MCHC: 33.3 g/dL (ref 30.0–36.0)
MCV: 93.7 fL (ref 80.0–100.0)
Platelets: 279 10*3/uL (ref 150–400)
RBC: 4.42 MIL/uL (ref 3.87–5.11)
RDW: 13.2 % (ref 11.5–15.5)
WBC: 9.6 10*3/uL (ref 4.0–10.5)
nRBC: 0 % (ref 0.0–0.2)

## 2020-12-10 LAB — POC URINE PREG, ED: Preg Test, Ur: NEGATIVE

## 2020-12-10 LAB — COMPREHENSIVE METABOLIC PANEL
ALT: 12 U/L (ref 0–44)
AST: 15 U/L (ref 15–41)
Albumin: 4.5 g/dL (ref 3.5–5.0)
Alkaline Phosphatase: 56 U/L (ref 38–126)
Anion gap: 8 (ref 5–15)
BUN: 10 mg/dL (ref 6–20)
CO2: 26 mmol/L (ref 22–32)
Calcium: 9.3 mg/dL (ref 8.9–10.3)
Chloride: 104 mmol/L (ref 98–111)
Creatinine, Ser: 0.69 mg/dL (ref 0.44–1.00)
GFR, Estimated: >60 mL/min (ref >60)
Glucose, Bld: 89 mg/dL (ref 70–99)
Potassium: 4 mmol/L (ref 3.5–5.1)
Sodium: 138 mmol/L (ref 135–145)
Total Bilirubin: 1.2 mg/dL (ref 0.3–1.2)
Total Protein: 7.7 g/dL (ref 6.5–8.1)

## 2020-12-10 LAB — URINALYSIS, ROUTINE W REFLEX MICROSCOPIC
Bilirubin Urine: NEGATIVE
Glucose, UA: NEGATIVE mg/dL
Ketones, ur: NEGATIVE mg/dL
Nitrite: NEGATIVE
Protein, ur: NEGATIVE mg/dL
Specific Gravity, Urine: 1.006 (ref 1.005–1.030)
WBC, UA: >50 WBC/hpf — ABNORMAL HIGH (ref 0–5)
pH: 6 (ref 5.0–8.0)

## 2020-12-10 LAB — HCG, QUANTITATIVE, PREGNANCY: hCG, Beta Chain, Quant, S: <1 m[IU]/mL (ref <5)

## 2020-12-10 LAB — LIPASE, BLOOD: Lipase: 20 U/L (ref 11–51)

## 2020-12-10 NOTE — ED Triage Notes (Addendum)
Pt to ED reporting she is late on her period and has had three pregnancy tests result negative but she is concerned due to new onset of headaches and abd cramping with three days of vomiting. Pt recently started new psychiatric drugs and had preeclampsia with her last pregnancy so was concerned and wanting to know if she is pregnant.

## 2020-12-10 NOTE — ED Triage Notes (Signed)
First Nurse Note:  C/O lower abdominal cramping. States "pregnancy type symptoms".  Has taken several home pregnancy tests, all negative.    AAOx3.  Skin warm and dry. NAD

## 2020-12-11 ENCOUNTER — Emergency Department
Admission: EM | Admit: 2020-12-11 | Discharge: 2020-12-11 | Disposition: A | Discharge status: Home / Self Care | Payer: Medicaid Other | Attending: Emergency Medicine | Admitting: Emergency Medicine

## 2020-12-11 DIAGNOSIS — N3001 Acute cystitis with hematuria: Secondary | ICD-10-CM

## 2020-12-11 DIAGNOSIS — G43909 Migraine, unspecified, not intractable, without status migrainosus: Secondary | ICD-10-CM

## 2020-12-11 MED ORDER — LACTATED RINGERS IV BOLUS
1000.0000 mL | Freq: Once | INTRAVENOUS | Status: AC
  Administered 2020-12-11: 1000 mL via INTRAVENOUS

## 2020-12-11 MED ORDER — KETOROLAC TROMETHAMINE 30 MG/ML IJ SOLN
15.0000 mg | Freq: Once | INTRAMUSCULAR | Status: AC
  Administered 2020-12-11: 15 mg via INTRAVENOUS
  Filled 2020-12-11: qty 1

## 2020-12-11 MED ORDER — NITROFURANTOIN MONOHYD MACRO 100 MG PO CAPS: 100.0000 mg | ORAL_CAPSULE | Freq: Two times a day (BID) | ORAL | 0 refills | Status: AC

## 2020-12-11 MED ORDER — METOCLOPRAMIDE HCL 5 MG/ML IJ SOLN
10.0000 mg | Freq: Once | INTRAMUSCULAR | Status: AC
  Administered 2020-12-11: 10 mg via INTRAVENOUS
  Filled 2020-12-11: qty 2

## 2020-12-11 MED ORDER — NITROFURANTOIN MONOHYD MACRO 100 MG PO CAPS
100.0000 mg | ORAL_CAPSULE | Freq: Once | ORAL | Status: AC
  Administered 2020-12-11: 100 mg via ORAL
  Filled 2020-12-11: qty 1
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
## 2020-12-11 NOTE — ED Notes (Signed)
Pt resting with blanket over her head. Family at bedside.

## 2020-12-11 NOTE — ED Notes (Signed)
MD at bedside. 

## 2020-12-13 LAB — URINE CULTURE: Culture: >=100000 — AB

## 2021-01-17 ENCOUNTER — Other Ambulatory Visit: Payer: Self-pay

## 2021-01-17 ENCOUNTER — Emergency Department
Admission: EM | Admit: 2021-01-17 | Discharge: 2021-01-17 | Disposition: A | Discharge status: Home / Self Care | Payer: Medicaid Other | Attending: Emergency Medicine | Admitting: Emergency Medicine

## 2021-01-17 DIAGNOSIS — F17210 Nicotine dependence, cigarettes, uncomplicated: Secondary | ICD-10-CM | POA: Insufficient documentation

## 2021-01-17 DIAGNOSIS — Z91040 Latex allergy status: Secondary | ICD-10-CM | POA: Diagnosis not present

## 2021-01-17 DIAGNOSIS — J101 Influenza due to other identified influenza virus with other respiratory manifestations: Secondary | ICD-10-CM | POA: Diagnosis not present

## 2021-01-17 DIAGNOSIS — R509 Fever, unspecified: Secondary | ICD-10-CM | POA: Diagnosis present

## 2021-01-17 DIAGNOSIS — Z20822 Contact with and (suspected) exposure to covid-19: Secondary | ICD-10-CM | POA: Diagnosis not present

## 2021-01-17 LAB — RESP PANEL BY RT-PCR (FLU A&B, COVID) ARPGX2
Influenza A by PCR: POSITIVE — AB
Influenza B by PCR: NEGATIVE
SARS Coronavirus 2 by RT PCR: NEGATIVE
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
## 2021-01-17 NOTE — ED Triage Notes (Signed)
Pt c/o cough , congestion, fever, body aches, sore throat since last night, home covid test was negative

## 2021-01-17 NOTE — Discharge Instructions (Signed)
You can take Tylenol and ibuprofen alternating for fever and body aches. Please rest and stay hydrated at home.

## 2021-01-30 ENCOUNTER — Emergency Department
Admission: EM | Admit: 2021-01-30 | Discharge: 2021-01-30 | Disposition: A | Discharge status: Home / Self Care | Payer: Medicaid Other | Attending: Emergency Medicine | Admitting: Emergency Medicine

## 2021-01-30 ENCOUNTER — Other Ambulatory Visit: Payer: Self-pay

## 2021-01-30 DIAGNOSIS — Z20822 Contact with and (suspected) exposure to covid-19: Secondary | ICD-10-CM | POA: Diagnosis not present

## 2021-01-30 DIAGNOSIS — B349 Viral infection, unspecified: Secondary | ICD-10-CM | POA: Diagnosis not present

## 2021-01-30 DIAGNOSIS — R509 Fever, unspecified: Secondary | ICD-10-CM | POA: Diagnosis present

## 2021-01-30 LAB — URINALYSIS, COMPLETE (UACMP) WITH MICROSCOPIC
Bacteria, UA: NONE SEEN
Bilirubin Urine: NEGATIVE
Glucose, UA: NEGATIVE mg/dL
Hgb urine dipstick: NEGATIVE
Leukocytes,Ua: NEGATIVE
Nitrite: NEGATIVE
Protein, ur: NEGATIVE mg/dL
Specific Gravity, Urine: 1.02 (ref 1.005–1.030)
pH: 7 (ref 5.0–8.0)

## 2021-01-30 LAB — RESP PANEL BY RT-PCR (FLU A&B, COVID) ARPGX2
Influenza A by PCR: NEGATIVE
Influenza B by PCR: NEGATIVE
SARS Coronavirus 2 by RT PCR: NEGATIVE

## 2021-01-30 LAB — PREGNANCY, URINE: Preg Test, Ur: NEGATIVE

## 2021-01-30 NOTE — ED Triage Notes (Signed)
Pt c/o fever, headache, chills, body aches that started yesterday. Pt states boyfriend tested positive for covid recently.

## 2021-01-30 NOTE — ED Provider Notes (Signed)
[facility] Provider Note    Event Date/Time   First MD Initiated Contact with Patient 01/30/21 2157     (approximate)   History   Fever   HPI  [name] is a 26 y.o. female who presents to the ED for evaluation of Fever I review outpatient PCP visit from 10/2018.  History of scoliosis and chronic lower back pain.  Otherwise history of bipolar disorder and anxiety. Diagnosed with influenza A on 12/22.  Patient presents to the ED for evaluation of a fever this morning.  She reports her cough improving from her influenza syndrome, but still has some persistent myalgias.  She reports that she thought that she was feeling better, but at work this morning she spiked a subjective fever and chills with sweats.  Due to quite a few people having COVID at her workplace, she was urged to get checked out and checked for COVID and flu.  She also tells me about dark-colored urine, lower abdominal discomfort and sensation of incomplete emptying while voiding.  She reports that she was diagnosed with a UTI last month and did not take the antibiotics appropriately and did not finish this course and questions if she has a persistent infection. Denies flank pain, upper abdominal pain or emesis.   Physical Exam   Triage Vital Signs: ED Triage Vitals  Enc Vitals Group     BP 01/30/21 1942 130/87     Pulse Rate 01/30/21 1942 64     Resp 01/30/21 1942 18     Temp 01/30/21 1942 98.8 F (37.1 C)     Temp Source 01/30/21 1942 Oral     SpO2 01/30/21 1942 100 %     Weight 01/30/21 1943 130 lb (59 kg)     Height 01/30/21 1943 5\' 6"  (1.676 m)     Head Circumference --      Peak Flow --      Pain Score 01/30/21 1943 5     Pain Loc --      Pain Edu? --      Excl. in [place]? --     Most recent vital signs: Vitals:   01/30/21 1942  BP: 130/87  Pulse: 64  Resp: 18  Temp: 98.8 F (37.1 C)  SpO2: 100%    General: Awake, no distress.  CV:  Good peripheral perfusion.   Resp:  Normal effort.  No rhonchi or wheezing.  CTAB. Abd:  No distention.  Minimal suprapubic tenderness without peritoneal features.  Abdomen is otherwise benign.  No CVA tenderness. MSK:  No deformity noted.  Neuro:  No focal deficits appreciated. Other:     ED Results / Procedures / Treatments   Labs (all labs ordered are listed, but only abnormal results are displayed) Labs Reviewed  URINALYSIS, COMPLETE (UACMP) WITH MICROSCOPIC - Abnormal; Notable for the following components:      Result Value   Ketones, ur TRACE (*)    All other components within normal limits  RESP PANEL BY RT-PCR (FLU A&B, COVID) ARPGX2  PREGNANCY, URINE  POC URINE PREG, ED    EKG    RADIOLOGY   Official radiology report(s): No results found.  PROCEDURES and INTERVENTIONS:  Procedures  Medications - No data to display   IMPRESSION / MDM / [place] / ED COURSE  I reviewed the triage vital signs and the nursing notes.  Largely healthy 26 year old female presents to the ED with subjective fever, without evidence of COVID or  flu.  She looks clinically well to me.  Vitals are normal on room air.  No stigmata of sepsis or systemic illness.  I do question acute cystitis and we will obtain a UA and POC pregnancy.  UA returns without infectious features and she is not pregnant.  We will discharge with return precautions  Clinical Course as of 01/30/21 2335  Wed Jan 30, 2021  2207 Patient ambulates independently to the restroom to provide a urine sample. [DS]  2315 Called lab to add on urine preg as her POC was not done [DS]    Clinical Course User Index [DS] Vladimir Crofts, MD     FINAL CLINICAL IMPRESSION(S) / ED DIAGNOSES   Final diagnoses:  Viral illness     Rx / DC Orders   ED Discharge Orders     None        Note:  This document was prepared using Dragon voice recognition software and may include unintentional dictation errors.   Vladimir Crofts, MD 01/30/21  [phone]

## 2021-01-30 NOTE — Discharge Instructions (Signed)
Please take Tylenol and ibuprofen/Advil for your pain.  It is safe to take them together, or to alternate them every few hours.  Take up to 1000mg  of Tylenol at a time, up to 4 times per day.  Do not take more than 4000 mg of Tylenol in 24 hours.  For ibuprofen, take 400-600 mg, 4-5 times per day.  You tested negative for influenza and COVID today.  Urine test without signs of UTI.

## 2021-01-30 NOTE — ED Provider Triage Note (Signed)
Emergency Medicine Provider Triage Evaluation Note  [name], a 26 y.o. female  was evaluated in triage.  Pt complains of COVID/flulike symptoms.  She was recently diagnosed with flu last week, and reports that her boyfriend was diagnosed with COVID on New Year's Day.  She presents today with ongoing malaise, fatigue, and subjective fevers.  She is presenting with a request for COVID testing.  She has received COVID vaccines but no boosters.  She is also not received any flu vaccine for the season.  Review of Systems  Positive: Fatigue, subjective fevers Negative: CP, SOB  Physical Exam  LMP 01/04/2021  Gen:   Awake, no distress   Resp:  Normal effort CTA MSK:   Moves extremities without difficulty  Other:  CVS: RRR  Medical Decision Making  Medically screening exam initiated at 7:43 PM.  Appropriate orders placed.  [name] was informed that the remainder of the evaluation will be completed by another provider, this initial triage assessment does not replace that evaluation, and the importance of remaining in the ED until their evaluation is complete.  Patient presents to the ED for evaluation of flulike symptoms.  Patient is requesting COVID testing, as her boyfriend was recently diagnosed with COVID.   Michele Needles, PA-C 01/30/21 1945

## 2021-05-03 ENCOUNTER — Other Ambulatory Visit: Payer: Self-pay

## 2021-05-03 ENCOUNTER — Emergency Department
Admission: EM | Admit: 2021-05-03 | Discharge: 2021-05-03 | Disposition: A | Discharge status: Home / Self Care | Payer: Medicaid Other | Attending: Emergency Medicine | Admitting: Emergency Medicine

## 2021-05-03 DIAGNOSIS — J039 Acute tonsillitis, unspecified: Secondary | ICD-10-CM | POA: Diagnosis not present

## 2021-05-03 DIAGNOSIS — J029 Acute pharyngitis, unspecified: Secondary | ICD-10-CM | POA: Diagnosis present

## 2021-05-03 LAB — GROUP A STREP BY PCR: Group A Strep by PCR: NOT DETECTED

## 2021-05-03 MED ORDER — AZITHROMYCIN 500 MG PO TABS: 500.0000 mg | ORAL_TABLET | Freq: Every day | ORAL | 0 refills | Status: AC

## 2021-05-03 MED ORDER — DEXAMETHASONE 4 MG PO TABS
10.0000 mg | ORAL_TABLET | Freq: Once | ORAL | Status: AC
  Administered 2021-05-03: 10 mg via ORAL
  Filled 2021-05-03: qty 3

## 2021-05-03 NOTE — ED Provider Notes (Signed)
? ?[facility] ?Provider Note ? ? ? Event Date/Time  ? First MD Initiated Contact with Patient 05/03/21 1855   ?  (approximate) ? ? ?History  ? ?Chief Complaint ?Sore Throat ? ? ?HPI ?KATRINKA Vang is a 26 y.o. female, history of anxiety, depression, bipolar 1, PTSD, presents to the emergency department for evaluation of sore throat.  Patient states that she has been experiencing a sore throat for the past 24 to 48 hours.  Endorses difficulty swallowing due to the pain.  She states that her daughter recently got over strep throat within the past couple weeks.  Additionally endorses fever/chills at home.  Denies chest pain, shortness of breath, abdominal pain, flank pain, nausea/vomiting, diarrhea, urinary symptoms, rashes/lesions, or dizziness/lightheadedness.  ? ?History Limitations: No limitations. ? ?    ? ? ?Physical Exam  ?Triage Vital Signs: ?ED Triage Vitals  ?Enc Vitals Group  ?   BP 05/03/21 1850 130/74  ?   Pulse Rate 05/03/21 1850 89  ?   Resp 05/03/21 1850 18  ?   Temp 05/03/21 1850 98.8 ?F (37.1 ?C)  ?   Temp Source 05/03/21 1850 Oral  ?   SpO2 05/03/21 1850 100 %  ?   Weight 05/03/21 1850 145 lb (65.8 kg)  ?   Height 05/03/21 1850 5\' 6"  (1.676 m)  ?   Head Circumference --   ?   Peak Flow --   ?   Pain Score 05/03/21 1849 6  ?   Pain Loc --   ?   Pain Edu? --   ?   Excl. in [place]? --   ? ? ?Most recent vital signs: ?Vitals:  ? 05/03/21 1850 05/03/21 1950  ?BP: 130/74 119/68  ?Pulse: 89 86  ?Resp: 18 16  ?Temp: 98.8 ?F (37.1 ?C) 98.2 ?F (36.8 ?C)  ?SpO2: 100% 98%  ? ? ?General: Awake, NAD.  ?Skin: Warm, dry. No rashes or lesions.  ?Eyes: PERRL. Conjunctivae normal.  ?ENT: Throat significantly erythematous with tonsillar swelling.  Exudates present on the right side.  Uvula midline. ?Neck: Normal ROM. No nuchal rigidity.  Anterior cervical lymphadenopathy present. ?CV: Good peripheral perfusion.  ?Resp: Normal effort.  Lung sounds clear bilaterally in the apices and bases ?Abd: Soft,  non-tender. No distention.  ?Neuro: At baseline. No gross neurological deficits.  ?MSK: No gross deformities. Normal ROM in all extremities.  ? ?Other:  ? ?Physical Exam ? ? ? ?ED Results / Procedures / Treatments  ?Labs ?(all labs ordered are listed, but only abnormal results are displayed) ?Labs Reviewed  ?GROUP A STREP BY PCR  ? ? ? ?EKG ?Not applicable ? ? ?RADIOLOGY ? ?ED Provider Interpretation: Not applicable ? ?No results found. ? ?PROCEDURES: ? ?Critical Care performed: None. ? ?Procedures ? ? ? ?MEDICATIONS ORDERED IN ED: ?Medications  ?dexamethasone (DECADRON) tablet 10 mg (10 mg Oral Given 05/03/21 1928)  ? ? ? ?IMPRESSION / MDM / ASSESSMENT AND PLAN / ED COURSE  ?I reviewed the triage vital signs and the nursing notes. ?             ?               ? ?Differential diagnosis includes, but is not limited to, strep pharyngitis, mononucleosis, viral URI, COVID-19, influenza. ? ?ED Course ?Patient appears well.  Vital signs within normal limits.  NAD.  Afebrile.  Given her endorsement of dysphagia, we will go ahead and administer dexamethasone 10 mg. ? ?Strep  PCR negative. ? ?Assessment/Plan ?Presentation consistent with tonsillitis.  Her strep PCR was negative, however given the patient's recent history of taking care of her daughter who tested positive for strep, I do suspect that PCR could be representing false negative result.  That being said, she does not fully satisfied [facility] criteria.  Had discussion with the patient about antibiotic treatment.  We will provide her with a prescription for azithromycin given her penicillin allergy.  Advised her to wait 48 hours prior to picking up the prescription to see if her symptoms resolve following the treatment she has received here.  Patient agreed to this plan.  We will plan to discharge ? ?Patient was provided with anticipatory guidance, return precautions, and educational material. Encouraged the patient to return to the emergency department at any time if  they begin to experience any new or worsening symptoms. Patient expressed understanding and agreed with the plan. ? ?  ? ? ?FINAL CLINICAL IMPRESSION(S) / ED DIAGNOSES  ? ?Final diagnoses:  ?Tonsillitis  ? ? ? ?Rx / DC Orders  ? ?ED Discharge Orders   ? ?      Ordered  ?  azithromycin (ZITHROMAX) 500 MG tablet  Daily       ? 05/03/21 1941  ? ?  ?  ? ?  ? ? ? ?Note:  This document was prepared using Dragon voice recognition software and may include unintentional dictation errors. ?  ?Teodoro Spray, [place] ?05/03/21 2049 ? ?  ?Arta Silence, MD ?05/03/21 2259 ? ?

## 2021-05-03 NOTE — Discharge Instructions (Addendum)
-  Treat pain with Tylenol/ibuprofen as needed.  Recommend ice water and Cepacol throat lozenges for pain relief as well.  ?-Review the educational material provided. ?-You may pick up the antibiotics prescribed after 24 to 48 hours if your symptoms fail to improve. ?-Return to the emergency department anytime if you begin to experience any new or worsening symptoms. ?

## 2021-05-03 NOTE — ED Notes (Signed)
See triage note  presents with sore throat since yesterday    having increased pain this am  with swallowing  afebrile on arrival ?

## 2021-05-03 NOTE — ED Triage Notes (Signed)
Pt to Ed via POV from home. Pt reports sore throat since yesterday and it is hard to swallow. Pt in NAD. VSS. ?

## 2021-06-21 ENCOUNTER — Emergency Department
Admission: EM | Admit: 2021-06-21 | Discharge: 2021-06-21 | Disposition: A | Discharge status: Home / Self Care | Payer: Medicaid Other | Attending: Emergency Medicine | Admitting: Emergency Medicine

## 2021-06-21 ENCOUNTER — Emergency Department: Payer: Medicaid Other

## 2021-06-21 ENCOUNTER — Other Ambulatory Visit: Payer: Self-pay

## 2021-06-21 DIAGNOSIS — R109 Unspecified abdominal pain: Secondary | ICD-10-CM | POA: Insufficient documentation

## 2021-06-21 LAB — URINALYSIS, ROUTINE W REFLEX MICROSCOPIC
Bilirubin Urine: NEGATIVE
Glucose, UA: NEGATIVE mg/dL
Hgb urine dipstick: NEGATIVE
Ketones, ur: NEGATIVE mg/dL
Leukocytes,Ua: NEGATIVE
Nitrite: NEGATIVE
Protein, ur: NEGATIVE mg/dL
Specific Gravity, Urine: 1.012 (ref 1.005–1.030)
pH: 8 (ref 5.0–8.0)

## 2021-06-21 LAB — PREGNANCY, URINE: Preg Test, Ur: NEGATIVE

## 2021-06-21 MED ORDER — ONDANSETRON 4 MG PO TBDP: 4.0000 mg | ORAL_TABLET | Freq: Three times a day (TID) | ORAL | 0 refills | Status: DC | PRN

## 2021-06-21 MED ORDER — HYDROCODONE-ACETAMINOPHEN 5-325 MG PO TABS: 1.0000 | ORAL_TABLET | ORAL | 0 refills | Status: DC | PRN

## 2021-06-21 NOTE — ED Notes (Signed)
DC ppw provided to patient. RX info and followup information reviewed. PT questions answered. pt declines vs at time of DC. PT provides verbal consent for dc. Pt ambulatory to lobby alert and oriented x4. 

## 2021-06-21 NOTE — ED Triage Notes (Signed)
Pt presents to ED with c/o of possible kidney infection, pt states being treated for UTI with ABX with no relief, been taking for 3 days. VSS.   Pt c/o of bilateral flank pain, no HX of kidney stones.

## 2021-06-21 NOTE — ED Provider Notes (Signed)
[facility] Provider Note    Event Date/Time   First MD Initiated Contact with Patient 06/21/21 1304     (approximate)   History   Flank Pain   HPI  [name] is a 26 y.o. female   presents to the ED with complaint of right flank pain.  Patient states that she was treated for urinary tract infection with Cipro 3 days ago.  She states that the pain radiates from her right flank area into the front with pressure much like when she had her baby.  Patient does not have any previous kidney stones but states that her twin has had several.  Patient has a history of urinary tract infections, anxiety, bipolar 1 disorder, depression, PTSD.      Physical Exam   Triage Vital Signs: ED Triage Vitals  Enc Vitals Group     BP 06/21/21 1220 121/81     Pulse Rate 06/21/21 1220 80     Resp 06/21/21 1220 15     Temp 06/21/21 1220 98.6 F (37 C)     Temp Source 06/21/21 1220 Oral     SpO2 06/21/21 1220 100 %     Weight --      Height --      Head Circumference --      Peak Flow --      Pain Score 06/21/21 1226 7     Pain Loc --      Pain Edu? --      Excl. in [place]? --     Most recent vital signs: Vitals:   06/21/21 1220  BP: 121/81  Pulse: 80  Resp: 15  Temp: 98.6 F (37 C)  SpO2: 100%     General: Awake, no distress.  CV:  Good peripheral perfusion.  Heart regular rate and rhythm. Resp:  Normal effort.  Lungs are clear. Abd:  No distention.  Other:  No CVA tenderness is noted.  There is generalized right flank pain.  Abdomen is soft, nontender.   ED Results / Procedures / Treatments   Labs (all labs ordered are listed, but only abnormal results are displayed) Labs Reviewed  URINALYSIS, ROUTINE W REFLEX MICROSCOPIC - Abnormal; Notable for the following components:      Result Value   Color, Urine YELLOW (*)    APPearance HAZY (*)    All other components within normal limits  PREGNANCY, URINE      RADIOLOGY CT renal stone study is  negative for stones or hydronephrosis.  Appendix is negative.  No acute findings other than mild stool noted.    PROCEDURES:  Critical Care performed:   Procedures   MEDICATIONS ORDERED IN ED: Medications - No data to display   IMPRESSION / MDM / ASSESSMENT AND PLAN / ED COURSE  I reviewed the triage vital signs and the nursing notes.   Differential diagnosis includes, but is not limited to, urinary tract infection, urolithiasis, obstruction due to urolithiasis, muscle strain.  26 year old female presents to the ED with complaint of right flank pain.  Patient states that this began recently but 3 days ago she was diagnosed with a urinary tract infection and started on Cipro.  She has not heard from the urine culture but was placed on a 10-day course of antibiotics.  She states at times there has been some nausea but does not have any medications to take.  She was offered Zofran while in the emergency department but declined.  CT scan  was negative for stones or obstruction.  Patient was made aware.  Urinalysis was improved but patient was advised to continue taking antibiotics until she is heard from her culture which [facility]  A prescription for Zofran and hydrocodone was sent to her pharmacy and she is encouraged to drink lots of fluids to stay hydrated and also increased urination.     FINAL CLINICAL IMPRESSION(S) / ED DIAGNOSES   Final diagnoses:  Right flank pain     Rx / DC Orders   ED Discharge Orders          Ordered    ondansetron (ZOFRAN-ODT) 4 MG disintegrating tablet  Every 8 hours PRN        06/21/21 1525    HYDROcodone-acetaminophen (NORCO/VICODIN) 5-325 MG tablet  Every 4 hours PRN        06/21/21 1525             Note:  This document was prepared using Dragon voice recognition software and may include unintentional dictation errors.   Tommi Rumps, PA-C 06/21/21 1557    Minna Antis, MD 06/21/21 1942

## 2021-06-21 NOTE — Discharge Instructions (Addendum)
Continue taking your Cipro until completely finished and Zofran along with Norco was sent to the pharmacy for you to take as needed for nausea and for pain.  If any severe worsening of your symptoms return to the emergency department over the holiday weekend.  Increase fluids to stay hydrated and also to flush your kidneys.

## 2022-01-24 ENCOUNTER — Encounter (HOSPITAL_COMMUNITY): Payer: Self-pay | Admitting: Psychiatry

## 2022-01-24 ENCOUNTER — Other Ambulatory Visit: Payer: Self-pay | Admitting: Psychiatry

## 2022-01-24 ENCOUNTER — Inpatient Hospital Stay (HOSPITAL_COMMUNITY)
Admission: AD | Admit: 2022-01-24 | Discharge: 2022-01-31 | DRG: 885 | Disposition: A | Discharge status: Home / Self Care | Payer: Medicaid Other | Source: Intra-hospital | Attending: Psychiatry | Admitting: Psychiatry

## 2022-01-24 ENCOUNTER — Other Ambulatory Visit: Payer: Self-pay

## 2022-01-24 DIAGNOSIS — Z9152 Personal history of nonsuicidal self-harm: Secondary | ICD-10-CM

## 2022-01-24 DIAGNOSIS — F411 Generalized anxiety disorder: Secondary | ICD-10-CM | POA: Diagnosis present

## 2022-01-24 DIAGNOSIS — Z818 Family history of other mental and behavioral disorders: Secondary | ICD-10-CM | POA: Diagnosis not present

## 2022-01-24 DIAGNOSIS — Z91148 Patient's other noncompliance with medication regimen for other reason: Secondary | ICD-10-CM | POA: Diagnosis not present

## 2022-01-24 DIAGNOSIS — F419 Anxiety disorder, unspecified: Secondary | ICD-10-CM | POA: Diagnosis present

## 2022-01-24 DIAGNOSIS — Z880 Allergy status to penicillin: Secondary | ICD-10-CM | POA: Diagnosis not present

## 2022-01-24 DIAGNOSIS — Z8249 Family history of ischemic heart disease and other diseases of the circulatory system: Secondary | ICD-10-CM | POA: Diagnosis not present

## 2022-01-24 DIAGNOSIS — Z9151 Personal history of suicidal behavior: Secondary | ICD-10-CM

## 2022-01-24 DIAGNOSIS — Z79899 Other long term (current) drug therapy: Secondary | ICD-10-CM | POA: Diagnosis not present

## 2022-01-24 DIAGNOSIS — F17210 Nicotine dependence, cigarettes, uncomplicated: Secondary | ICD-10-CM | POA: Diagnosis present

## 2022-01-24 DIAGNOSIS — Z62810 Personal history of physical and sexual abuse in childhood: Secondary | ICD-10-CM

## 2022-01-24 DIAGNOSIS — Z808 Family history of malignant neoplasm of other organs or systems: Secondary | ICD-10-CM | POA: Diagnosis not present

## 2022-01-24 DIAGNOSIS — Z008 Encounter for other general examination: Secondary | ICD-10-CM

## 2022-01-24 DIAGNOSIS — F319 Bipolar disorder, unspecified: Secondary | ICD-10-CM | POA: Diagnosis present

## 2022-01-24 DIAGNOSIS — F4310 Post-traumatic stress disorder, unspecified: Secondary | ICD-10-CM | POA: Diagnosis present

## 2022-01-24 DIAGNOSIS — G4700 Insomnia, unspecified: Secondary | ICD-10-CM | POA: Diagnosis present

## 2022-01-24 DIAGNOSIS — K5900 Constipation, unspecified: Secondary | ICD-10-CM | POA: Diagnosis present

## 2022-01-24 DIAGNOSIS — Z91040 Latex allergy status: Secondary | ICD-10-CM | POA: Diagnosis not present

## 2022-01-24 DIAGNOSIS — F314 Bipolar disorder, current episode depressed, severe, without psychotic features: Secondary | ICD-10-CM | POA: Diagnosis present

## 2022-01-24 DIAGNOSIS — Z1152 Encounter for screening for COVID-19: Secondary | ICD-10-CM

## 2022-01-24 DIAGNOSIS — R45851 Suicidal ideations: Secondary | ICD-10-CM | POA: Diagnosis present

## 2022-01-24 DIAGNOSIS — F32A Depression, unspecified: Secondary | ICD-10-CM | POA: Diagnosis present

## 2022-01-24 LAB — RESP PANEL BY RT-PCR (RSV, FLU A&B, COVID)  RVPGX2
Influenza A by PCR: NEGATIVE
Influenza B by PCR: NEGATIVE
Resp Syncytial Virus by PCR: NEGATIVE
SARS Coronavirus 2 by RT PCR: NEGATIVE

## 2022-01-24 MED ORDER — LURASIDONE HCL 40 MG PO TABS
40.0000 mg | ORAL_TABLET | Freq: Every day | ORAL | Status: DC
  Administered 2022-01-25 – 2022-01-27 (×3): 40 mg via ORAL
  Filled 2022-01-24 (×5): qty 1

## 2022-01-24 MED ORDER — TRAZODONE HCL 50 MG PO TABS
50.0000 mg | ORAL_TABLET | Freq: Every evening | ORAL | Status: DC | PRN
  Administered 2022-01-24: 50 mg via ORAL
  Filled 2022-01-24: qty 1

## 2022-01-24 MED ORDER — TRAZODONE HCL 50 MG PO TABS
50.0000 mg | ORAL_TABLET | Freq: Once | ORAL | Status: AC
  Administered 2022-01-24: 50 mg via ORAL
  Filled 2022-01-24 (×2): qty 1

## 2022-01-24 MED ORDER — PNEUMOCOCCAL VAC POLYVALENT 25 MCG/0.5ML IJ INJ
0.5000 mL | INJECTION | INTRAMUSCULAR | Status: DC
  Filled 2022-01-24: qty 0.5

## 2022-01-24 MED ORDER — RISPERIDONE 2 MG PO TBDP
2.0000 mg | ORAL_TABLET | Freq: Three times a day (TID) | ORAL | Status: DC | PRN
  Administered 2022-01-25: 2 mg via ORAL
  Filled 2022-01-24 (×2): qty 1

## 2022-01-24 MED ORDER — ACETAMINOPHEN 325 MG PO TABS
650.0000 mg | ORAL_TABLET | Freq: Four times a day (QID) | ORAL | Status: DC | PRN
  Administered 2022-01-24 – 2022-01-25 (×2): 650 mg via ORAL
  Filled 2022-01-24 (×2): qty 2

## 2022-01-24 MED ORDER — ZIPRASIDONE MESYLATE 20 MG IM SOLR: 20.0000 mg | INTRAMUSCULAR | Status: DC | PRN

## 2022-01-24 MED ORDER — INFLUENZA VAC SPLIT QUAD 0.5 ML IM SUSY
0.5000 mL | PREFILLED_SYRINGE | INTRAMUSCULAR | Status: DC
  Filled 2022-01-24: qty 0.5

## 2022-01-24 MED ORDER — HYDROXYZINE HCL 25 MG PO TABS
25.0000 mg | ORAL_TABLET | Freq: Three times a day (TID) | ORAL | Status: DC | PRN
  Administered 2022-01-24 – 2022-01-25 (×3): 25 mg via ORAL
  Filled 2022-01-24 (×3): qty 1

## 2022-01-24 MED ORDER — ALUM & MAG HYDROXIDE-SIMETH 200-200-20 MG/5ML PO SUSP: 30.0000 mL | ORAL | Status: DC | PRN

## 2022-01-24 MED ORDER — LORAZEPAM 1 MG PO TABS
1.0000 mg | ORAL_TABLET | ORAL | Status: AC | PRN
  Administered 2022-01-26: 1 mg via ORAL
  Filled 2022-01-24: qty 1

## 2022-01-24 MED ORDER — MAGNESIUM HYDROXIDE 400 MG/5ML PO SUSP
30.0000 mL | Freq: Every day | ORAL | Status: DC | PRN
  Administered 2022-01-27: 30 mL via ORAL
  Filled 2022-01-24: qty 30

## 2022-01-24 MED ORDER — NICOTINE 21 MG/24HR TD PT24
21.0000 mg | MEDICATED_PATCH | Freq: Every day | TRANSDERMAL | Status: DC
  Administered 2022-01-24 – 2022-01-31 (×8): 21 mg via TRANSDERMAL
  Filled 2022-01-24 (×12): qty 1
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
## 2022-01-24 NOTE — Tx Team (Signed)
Initial Treatment Plan 01/24/2022 6:37 PM [name] GNO:[id number]    PATIENT STRESSORS: Loss of grandmother Christmas weekend   Medication change or noncompliance     PATIENT STRENGTHS: Average or above average intelligence  Capable of independent living  Communication skills  Supportive family/friends    PATIENT IDENTIFIED PROBLEMS: Alteration in mood "anxiety 10/10, depression 7/10"    Medication noncompliance "out of meds for 3 days"    Sleep disturbance "no sleep for 3 days"    Inadequate nutrition "one meal a day for a month"         DISCHARGE CRITERIA:  Improved stabilization in mood, thinking, and/or behavior Verbal commitment to aftercare and medication compliance  PRELIMINARY DISCHARGE PLAN: Return to previous living arrangement  PATIENT/FAMILY INVOLVEMENT: This treatment plan has been presented to and reviewed with the patient, [name].  The patient have been given the opportunity to ask questions and make suggestions.  Edwyna Perfect, RN 01/24/2022, 6:37 PM
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
## 2022-01-24 NOTE — Plan of Care (Signed)
  Problem: Education: Goal: Emotional status will improve Outcome: Progressing Goal: Mental status will improve Outcome: Progressing   Problem: Coping: Goal: Ability to demonstrate self-control will improve Outcome: Progressing   Problem: Health Behavior/Discharge Planning: Goal: Compliance with treatment plan for underlying cause of condition will improve Outcome: Progressing   Problem: Activity: Goal: Sleeping patterns will improve Outcome: Not Progressing   
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
## 2022-01-24 NOTE — Progress Notes (Signed)
Adult Psychoeducational Group Note  Date:  01/24/2022 Time:  9:21 PM  Group Topic/Focus:  AA Group  Participation Level:  Active  Participation Quality:  Appropriate  Affect:  Appropriate  Cognitive:  Appropriate  Insight: Appropriate  Engagement in Group:  Engaged  Modes of Intervention:  Rapport Building and Support  Additional Comments:   Pt attended the AA/Wrap Up group.  Edmund Hilda Alin Hutchins 01/24/2022, 9:21 PM

## 2022-01-25 DIAGNOSIS — F314 Bipolar disorder, current episode depressed, severe, without psychotic features: Secondary | ICD-10-CM

## 2022-01-25 MED ORDER — HYDROXYZINE HCL 50 MG PO TABS
50.0000 mg | ORAL_TABLET | Freq: Three times a day (TID) | ORAL | Status: DC | PRN
  Administered 2022-01-27: 50 mg via ORAL
  Filled 2022-01-25 (×2): qty 1

## 2022-01-25 MED ORDER — PNEUMOCOCCAL 20-VAL CONJ VACC 0.5 ML IM SUSY
0.5000 mL | PREFILLED_SYRINGE | INTRAMUSCULAR | Status: DC
  Filled 2022-01-25: qty 0.5

## 2022-01-25 MED ORDER — TOPIRAMATE 100 MG PO TABS
100.0000 mg | ORAL_TABLET | Freq: Every day | ORAL | Status: DC
  Administered 2022-01-25 – 2022-01-29 (×5): 100 mg via ORAL
  Filled 2022-01-25 (×6): qty 1

## 2022-01-25 MED ORDER — MIRTAZAPINE 15 MG PO TBDP
45.0000 mg | ORAL_TABLET | Freq: Every day | ORAL | Status: DC
  Administered 2022-01-25 – 2022-01-28 (×4): 45 mg via ORAL
  Filled 2022-01-25 (×6): qty 3

## 2022-01-25 MED ORDER — TRAZODONE HCL 100 MG PO TABS
100.0000 mg | ORAL_TABLET | Freq: Every evening | ORAL | Status: DC | PRN
  Administered 2022-01-25 – 2022-01-30 (×6): 100 mg via ORAL
  Filled 2022-01-25 (×6): qty 1

## 2022-01-25 MED ORDER — LAMOTRIGINE 100 MG PO TABS
100.0000 mg | ORAL_TABLET | Freq: Two times a day (BID) | ORAL | Status: DC
  Administered 2022-01-25 – 2022-01-31 (×13): 100 mg via ORAL
  Filled 2022-01-25 (×16): qty 1

## 2022-01-25 NOTE — Progress Notes (Signed)
   01/25/22 0558  15 Minute Checks  Location Bedroom  Visual Appearance Calm  Behavior Sleeping  Sleep (Behavioral Health Patients Only)  Calculate sleep? (Click Yes once per 24 hr at 0600 safety check) Yes  Documented sleep last 24 hours 7.5

## 2022-01-25 NOTE — Group Note (Signed)
[facility] LCSW Group Therapy Note  Date:  01/25/2022   Type of Therapy and Topic:  Group Therapy:  Focus for the New Year  Participation Level:  Did Not Attend   Description of Group:  The focus of this group was to provide patients with an opportunity to think about and discuss what they can work on this coming year that will result in them being happier and healthier one year from today.  It was reviewed how "new year's resolutions" often fade in importance within a few days, so patients were encouraged to think in a broader, more impactful way about what they wish to focus on to change their lives.  Therapeutic Goals Patients discussed in general the benefit of having goals to work on Patients described their own personal goals/focus for the next year that will enable them to be happier and healthier Patients received encouragement from each other and CSW Patients provided support and ideas to each other  Summary of Patient Progress: N/A   Therapeutic Modalities Processing   Ronique Simerly Grossman-Orr, LCSW    
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
## 2022-01-25 NOTE — [facility] Counselor (Signed)
Clinical Social Work Note  CSW attempted to meet with patient to do Psychosocial Assessment, but she declined at the time, stating she was too tired.  CSW team will continue efforts.  Ambrose Mantle, LCSW 01/25/2022, 3:27 PM
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
## 2022-01-25 NOTE — Progress Notes (Signed)
   01/24/22 2121  Psych Admission Type (Psych Patients Only)  Admission Status Voluntary  Psychosocial Assessment  Patient Complaints Anxiety;Depression  Eye Contact Fair  Facial Expression Anxious  Affect Anxious  Speech Logical/coherent  Interaction Assertive  Motor Activity Other (Comment) (WDL)  Appearance/Hygiene Unremarkable  Behavior Characteristics Cooperative;Anxious  Mood Anxious;Depressed  Thought Process  Coherency WDL  Content WDL  Delusions None reported or observed  Perception WDL  Hallucination None reported or observed  Judgment Impaired  Confusion None  Danger to Self  Current suicidal ideation? Denies  Agreement Not to Harm Self Yes  Description of Agreement Verbal  Danger to Others  Danger to Others None reported or observed

## 2022-01-25 NOTE — [facility] Group Notes (Signed)
Goals Group 01/25/22   Group Focus: affirmation, clarity of thought, and goals/reality orientation Treatment Modality:  Psychoeducation Interventions utilized were assignment, group exercise, and support Purpose: To be able to understand and verbalize the reason for their admission to the hospital. To understand that the medication helps with their chemical imbalance but they also need to work on their choices in life. To be challenged to develop a list of 30 positives about themselves. Also introduce the concept that "feelings" are not reality.  Participation Level: did not attend  [name] A 

## 2022-01-25 NOTE — Progress Notes (Signed)
[facility] Group Notes:  (Nursing/MHT/Case Management/Adjunct)  Date:  01/25/2022  Time:  2000  Type of Therapy:   wrap up group  Participation Level:  Active  Participation Quality:  Appropriate, Attentive, Sharing, and Supportive  Affect:  Appropriate  Cognitive:  Alert  Insight:  Improving  Engagement in Group:  Engaged  Modes of Intervention:  Clarification, Education, and Support  Summary of Progress/Problems: Positive thinking and positive change were discussed.   [name] S 01/25/2022, 9:08 PM

## 2022-01-25 NOTE — [facility] Group Notes (Signed)
.  Psychoeducational Group Note    Date:  01/25/2022 Time:1300-1400    Purpose of Group: . The group focus' on teaching patients on how to identify their needs and their Life Skills:  Vang group where two lists are made. What people need and what are things that we do that are unhealthy. The lists are developed by the patients and it is explained that we often do the actions that are not healthy to get our list of needs met.  Goal:: to develop the coping skills needed to get their needs met  Participation Level:  Active  Participation Quality:  Appropriate  Affect:  Appropriate  Cognitive:  Oriented  Insight: Improving  Engagement in Group:  Engaged  Modes of Intervention:  Activity, Discussion, Education, and Support  Additional Comments:    [name]    

## 2022-01-26 LAB — CBC WITH DIFFERENTIAL/PLATELET
Abs Immature Granulocytes: 0.02 10*3/uL (ref 0.00–0.07)
Basophils Absolute: 0.1 10*3/uL (ref 0.0–0.1)
Basophils Relative: 1 %
Eosinophils Absolute: 0.2 10*3/uL (ref 0.0–0.5)
Eosinophils Relative: 3 %
HCT: 40.8 % (ref 36.0–46.0)
Hemoglobin: 13.3 g/dL (ref 12.0–15.0)
Immature Granulocytes: 0 %
Lymphocytes Relative: 34 %
Lymphs Abs: 2.6 10*3/uL (ref 0.7–4.0)
MCH: 31.1 pg (ref 26.0–34.0)
MCHC: 32.6 g/dL (ref 30.0–36.0)
MCV: 95.6 fL (ref 80.0–100.0)
Monocytes Absolute: 0.7 10*3/uL (ref 0.1–1.0)
Monocytes Relative: 9 %
Neutro Abs: 4.1 10*3/uL (ref 1.7–7.7)
Neutrophils Relative %: 53 %
Platelets: 258 10*3/uL (ref 150–400)
RBC: 4.27 MIL/uL (ref 3.87–5.11)
RDW: 13.2 % (ref 11.5–15.5)
WBC: 7.6 10*3/uL (ref 4.0–10.5)
nRBC: 0 % (ref 0.0–0.2)

## 2022-01-26 LAB — COMPREHENSIVE METABOLIC PANEL
ALT: 12 U/L (ref 0–44)
AST: 15 U/L (ref 15–41)
Albumin: 4.3 g/dL (ref 3.5–5.0)
Alkaline Phosphatase: 54 U/L (ref 38–126)
Anion gap: 9 (ref 5–15)
BUN: 16 mg/dL (ref 6–20)
CO2: 19 mmol/L — ABNORMAL LOW (ref 22–32)
Calcium: 9.1 mg/dL (ref 8.9–10.3)
Chloride: 108 mmol/L (ref 98–111)
Creatinine, Ser: 0.73 mg/dL (ref 0.44–1.00)
GFR, Estimated: >60 mL/min (ref >60)
Glucose, Bld: 108 mg/dL — ABNORMAL HIGH (ref 70–99)
Potassium: 3.4 mmol/L — ABNORMAL LOW (ref 3.5–5.1)
Sodium: 136 mmol/L (ref 135–145)
Total Bilirubin: 0.4 mg/dL (ref 0.3–1.2)
Total Protein: 6.9 g/dL (ref 6.5–8.1)

## 2022-01-26 LAB — TSH: TSH: 1.531 u[IU]/mL (ref 0.350–4.500)

## 2022-01-26 MED ORDER — ONDANSETRON 4 MG PO TBDP: 4.0000 mg | ORAL_TABLET | Freq: Three times a day (TID) | ORAL | Status: DC | PRN

## 2022-01-26 NOTE — Group Note (Signed)
LCSW Group Therapy Note   01/26/2022 4:40 PM  Type of Therapy and Topic:  Group Therapy:   Introduction to Cognitive-Behavioral Therapy    Participation Level:  Active  Description of Group: Participants were asked to identify their overall emotion at the beginning of group.  Patients were asked to identify thoughts they often have while experiencing those emotions, then the opposite -- thoughts that might lead to these emotions.   We also discussed what behaviors might come out of either those thoughts or feelings.  The model of the relationships between feelings, thoughts, and behaviors was explained and diagrammed on the white board.  The remainder of the meeting was spent processing patients' expressing emotions through the CBT lens.  There was an emphasis on the need to check to see if feelings and/or thoughts are based on accurate information.  We also talked about how to appropriately use positive affirmations.  Patients found they could relate to each other's feelings even though their experiences are quite different.  Therapeutic Goals:   Patient will identify their current emotional state Patient will understand the relationship between thoughts, emotions and triggers.  Patient will state at least one positive affirmation they are willing to use at this time   Summary of Patient Progress:   Patient stated the last time she remembers being angry was when she caught her boyfriend talking to another girl and her thought at the time was "Why not me?  Why can't anyone love me?  Why do they always leave?"  The patient was able to identify that thought as unhealthy because it focuses on an assumption.     Therapeutic Modalities: Cognitive Behavioral Therapy Motivational Interviewing

## 2022-01-26 NOTE — [facility] Group Notes (Signed)
Pt attended wrap up group 

## 2022-01-26 NOTE — Progress Notes (Signed)
   01/26/22 0039  Psych Admission Type (Psych Patients Only)  Admission Status Voluntary  Psychosocial Assessment  Patient Complaints Anxiety  Eye Contact Fair  Facial Expression Anxious  Affect Appropriate to circumstance  Speech Logical/coherent  Interaction Assertive  Motor Activity Other (Comment) (WDL)  Appearance/Hygiene Unremarkable  Behavior Characteristics Appropriate to situation  Mood Anxious  Thought Process  Coherency WDL  Content WDL  Delusions None reported or observed  Perception WDL  Hallucination None reported or observed  Judgment Impaired  Confusion None  Danger to Self  Current suicidal ideation? Denies  Agreement Not to Harm Self Yes  Description of Agreement verbal  Danger to Others  Danger to Others None reported or observed

## 2022-01-26 NOTE — [facility] Group Notes (Signed)
Adult Psychoeducational Group Note Date:  01/26/2022 Time:  0900-1000 Group Topic/Focus: PROGRESSIVE RELAXATION. A group where deep breathing is taught and tensing and relaxation muscle groups is used. Imagery is used as well.  Pts are asked to imagine 3 pillars that hold them up when they are not able to hold themselves up and to share that with the group.   Participation Level:  did not attend   : [name] A   

## 2022-01-26 NOTE — Progress Notes (Signed)
[facility] MD Progress Note  01/26/2022 3:23 PM  [name]   MRN:  [id number]  Subjective: [name] states, "I was able to sleep last night however I still feel very anxious due to my pending grandmother's funeral and we are I would be leaving when I leave this facility."  Reason for admission: [name] is an 26 y.o. female with a past psychiatric history of bipolar I disorder depressed severe, anxiety, depression, suicidal ideations, and inpatient psychiatric hospitalizations.  She is admitted to [facility] adult psychiatric unit after presenting  with a friend  as a voluntary walk-in to [facility] for assessment for being off her psychotropic medications x 2 months, staying up x 3 days without sleep, increased emotional lability, racing thoughts, in the context of grandmother's death, break-up with boyfriend, and custody battle for her 33 years old daughter.  She currently lives with ex boyfriend parents.  Based on clinical factors presented below, patient is admitted for mood stabilization, medication management and safety.   24 hours chart review: New admission labs ordered, since no results noted in the chart.  Vital signs and labs reviewed with elevated pulse of 93-116, Otherwise normal.  PRNs of Tylenol for mild pain, trazodone for sleep, and risperidone for agitation requested and administered last night.  Patient taking her scheduled medication without somatic effect.  Today's assessment: Patient is seen and examined on 300 Hall's laying on her bed.  She appears calm and pleasant, able to participate in the examination.  Presents alert and oriented x 4.  Speech clear with normal pattern and volume.  She reports mood is better today and rates depression as 7/10, with 10 being the worst.  She reports decreased anxiety of 7/10, with 10 being the worst.  Encouraged patient to call for as needed medication as needed to relieve symptoms.  Reports appetite is okay.  Reports  improved sleep with trazodone that was increased to 100 mg last night.  No delusional thinking or paranoia observed during encounter.  She did not seem to be responding to a external or internal stimuli during assessment.  She denies SI, HI, AVH.  Reports nausea earlier, Zofran disintegrating tablet 4 mg p.o. every 8 hours as needed ordered with some relief.  Reiterates education on rationales, benefits and possible side effects of medication including weight gain and metabolic syndrome and he verbalizes understanding.  Principal Problem: Bipolar 1 disorder, depressed, severe (HCC)  Diagnosis: Principal Problem:   Bipolar 1 disorder, depressed, severe (HCC) Active Problems:   Anxiety   Depression   Bipolar 1 disorder (HCC)  Total Time spent with patient: 30 minutes  Past Psychiatric History: Previous Psych Diagnoses:  Prior inpatient treatment: Yes Current/prior outpatient treatment: Yes Prior rehab hx: No Psychotherapy hx: Yes History of suicide: Yes, attempted suicide via OD on medication in 2015 History of homicide or aggression: No Psychiatric medication history: Yes, see HPI Psychiatric medication compliance history: Yes Neuromodulation history: No Current Psychiatrist: Psychiatrist at [facility] Current therapist: None  Past Medical History:  Past Medical History:  Diagnosis Date   Allergic conjunctivitis    Allergy    Anxiety 06/04/2011   panic attacks   Astigmatism    glasses   Bipolar 1 disorder (HCC)    Borderline personality disorder (HCC)    Depression    Obesity    Post traumatic stress disorder    Urinary tract infection 02-May-1993   recurrent, nl [facility])    Past Surgical History:  Procedure Laterality Date   INGUINAL HERNIA REPAIR  7/95   bilat exploration, left repair   UMBILICAL HERNIA REPAIR  7/95   repaired as infant   Family History:  Family History  Problem Relation Age of Onset   Hypertension Mother    Hypothyroidism Mother    Hypertension  Father    Brain cancer Maternal Grandfather    Cancer Other    Family Psychiatric  History: Psych: Mother diagnosed with bipolar, brother diagnosed with bipolar, depression, anxiety, PTSD.  Father has history of homicidal ideation and killed patient's stepfather in 2014, currently in prison. Psych Rx: Yes SA/HA: Mom shot herself in the stomach for suicide attempt 22 years ago.  And father had HA as indicated above. Substance use family hx: Yes.  Father and mom alcoholics.  Mother abuses pain pills    Social History:  Social History   Substance and Sexual Activity  Alcohol Use No     Social History   Substance and Sexual Activity  Drug Use Yes   Types: Other-see comments   Comment: pt uses delta 8    Social History   Socioeconomic History   Marital status: Single    Spouse name: Not on file   Number of children: Not on file   Years of education: Not on file   Highest education level: Not on file  Occupational History   Not on file  Tobacco Use   Smoking status: Every Day    Packs/day: 1.00    Types: Cigarettes   Smokeless tobacco: Current   Tobacco comments:    Vapes delta 8  Vaping Use   Vaping Use: Every day  Substance and Sexual Activity   Alcohol use: No   Drug use: Yes    Types: Other-see comments    Comment: pt uses delta 8   Sexual activity: Not Currently    Birth control/protection: Pill, Condom  Other Topics Concern   Not on file  Social History Narrative   Graduated HS   26 years old   Living with grandmother   Moving to [place]         Social Determinants of Health   Financial Resource Strain: Unknown (10/18/2018)   Overall Financial Resource Strain (CARDIA)    Difficulty of Paying Living Expenses: Patient refused  Food Insecurity: No Food Insecurity (01/24/2022)   Hunger Vital Sign    Worried About Running Out of Food in the Last Year: Never true    Ran Out of Food in the Last Year: Never true  Transportation Needs: No Transportation  Needs (01/24/2022)   PRAPARE - Administrator, Civil Service (Medical): No    Lack of Transportation (Non-Medical): No  Physical Activity: Unknown (10/18/2018)   Exercise Vital Sign    Days of Exercise per Week: Patient refused    Minutes of Exercise per Session: Patient refused  Stress: Not on file  Social Connections: Unknown (10/18/2018)   Social Connection and Isolation Panel [NHANES]    Frequency of Communication with Friends and Family: Patient refused    Frequency of Social Gatherings with Friends and Family: Patient refused    Attends Religious Services: Patient refused    Database administrator or Organizations: Patient refused    Attends Banker Meetings: Patient refused    Marital Status: Patient refused   Additional Social History:   Sleep: Good  Appetite:  Good  Current Medications: Current Facility-Administered Medications  Medication Dose Route Frequency Provider Last Rate  Last Admin   acetaminophen (TYLENOL) tablet 650 mg  650 mg Oral Q6H PRN Leevy-Johnson, Brooke A, NP   650 mg at 01/25/22 1608   alum & mag hydroxide-simeth (MAALOX/MYLANTA) 200-200-20 MG/5ML suspension 30 mL  30 mL Oral Q4H PRN Leevy-Johnson, Brooke A, NP       hydrOXYzine (ATARAX) tablet 50 mg  50 mg Oral TID PRN Algenis Ballin, Jesusita Okaina C, FNP       influenza vac split quadrivalent PF (FLUARIX) injection 0.5 mL  0.5 mL Intramuscular Tomorrow-1000 Nkwenti, Doris, NP       lamoTRIgine (LAMICTAL) tablet 100 mg  100 mg Oral BID Cecilie LowersNtuen, Trustin Chapa C, FNP   100 mg at 01/26/22 1003   risperiDONE (RISPERDAL M-TABS) disintegrating tablet 2 mg  2 mg Oral Q8H PRN Leevy-Johnson, Brooke A, NP   2 mg at 01/25/22 1606   And   LORazepam (ATIVAN) tablet 1 mg  1 mg Oral PRN Leevy-Johnson, Brooke A, NP       And   ziprasidone (GEODON) injection 20 mg  20 mg Intramuscular PRN Leevy-Johnson, Brooke A, NP       lurasidone (LATUDA) tablet 40 mg  40 mg Oral Q breakfast Leevy-Johnson, Brooke A, NP   40 mg at 01/26/22  1003   magnesium hydroxide (MILK OF MAGNESIA) suspension 30 mL  30 mL Oral Daily PRN Leevy-Johnson, Brooke A, NP       mirtazapine (REMERON SOL-TAB) disintegrating tablet 45 mg  45 mg Oral QHS [name] Henthorn C, FNP   45 mg at 01/25/22 2121   nicotine (NICODERM CQ - dosed in mg/24 hours) patch 21 mg  21 mg Transdermal Daily Starleen BlueNkwenti, Doris, NP   21 mg at 01/26/22 1004   ondansetron (ZOFRAN-ODT) disintegrating tablet 4 mg  4 mg Oral Q8H PRN Harun Brumley, Jesusita Okaina C, FNP       pneumococcal 20-valent conjugate vaccine (PREVNAR 20) injection 0.5 mL  0.5 mL Intramuscular Tomorrow-1000 Massengill, Nathan, MD       topiramate (TOPAMAX) tablet 100 mg  100 mg Oral Daily Nyelle Wolfson C, FNP   100 mg at 01/26/22 1004   traZODone (DESYREL) tablet 100 mg  100 mg Oral QHS PRN Cecilie LowersNtuen, Vernal Hritz C, FNP   100 mg at 01/25/22 2122    Lab Results: No results found for this or any previous visit (from the past 48 hour(s)).  Blood Alcohol level:  Lab Results  Component Value Date   [facility]   ETH <11 08/31/2010    Metabolic Disorder Labs: Lab Results  Component Value Date   HGBA1C 5.0 10/19/2018   MPG 97 10/19/2018   MPG 93.93 11/07/2016   No results found for: "PROLACTIN" Lab Results  Component Value Date   CHOL 191 10/19/2018   TRIG 82 10/19/2018   HDL 56 10/19/2018   CHOLHDL 3.4 10/19/2018   VLDL 16 10/19/2018   LDLCALC 119 (H) 10/19/2018   LDLCALC 96 11/07/2016    Physical Findings: AIMS: Facial and Oral Movements Muscles of Facial Expression: None, normal Lips and Perioral Area: None, normal Jaw: None, normal Tongue: None, normal,Extremity Movements Upper (arms, wrists, hands, fingers): None, normal Lower (legs, knees, ankles, toes): None, normal, Trunk Movements Neck, shoulders, hips: None, normal, Overall Severity Severity of abnormal movements (highest score from questions above): None, normal Incapacitation due to abnormal movements: None, normal Patient's awareness of abnormal movements (rate  only patient's report): No Awareness, Dental Status Current problems with teeth and/or dentures?: No Does patient usually wear dentures?: No  CIWA:  COWS:     Musculoskeletal: Strength & Muscle Tone: within normal limits Gait & Station: normal Patient leans: N/A  Psychiatric Specialty Exam:  Presentation  General Appearance:  Appropriate for Environment; Casual; Fairly Groomed  Eye Contact: Fair  Speech: Clear and Coherent  Speech Volume: Normal  Handedness: Right  Mood and Affect  Mood: Anxious; Depressed  Affect: Congruent; Appropriate  Thought Process  Thought Processes: Coherent  Descriptions of Associations:Intact  Orientation:Full (Time, Place and Person)  Thought Content:Logical  History of Schizophrenia/Schizoaffective disorder:No data recorded Duration of Psychotic Symptoms:No data recorded Hallucinations:Hallucinations: None  Ideas of Reference:None  Suicidal Thoughts:Suicidal Thoughts: No  Homicidal Thoughts:Homicidal Thoughts: No  Sensorium  Memory: Immediate Fair; Recent Fair; Remote Fair  Judgment: Fair  Insight: Fair  Art therapist  Concentration: Good  Attention Span: Fair  Recall: [employer] of Knowledge: Fair  Language: Fair  Psychomotor Activity  Psychomotor Activity: Psychomotor Activity: Normal  Assets  Assets: Communication Skills; Desire for Improvement; Physical Health; Social Support  Sleep  Sleep: Sleep: Good Number of Hours of Sleep: 6  Physical Exam: Physical Exam Vitals and nursing note reviewed.  HENT:     Head: Normocephalic.     Nose: Nose normal.     Mouth/Throat:     Mouth: Mucous membranes are moist.     Pharynx: Oropharynx is clear.  Eyes:     Conjunctiva/sclera: Conjunctivae normal.     Pupils: Pupils are equal, round, and reactive to light.  Cardiovascular:     Rate and Rhythm: Normal rate.  Pulmonary:     Effort: Pulmonary effort is normal.  Abdominal:      Palpations: Abdomen is soft.  Genitourinary:    Comments: Deferred Musculoskeletal:        General: Normal range of motion.     Cervical back: Normal range of motion.  Skin:    General: Skin is warm.  Neurological:     General: No focal deficit present.     Mental Status: She is oriented to person, place, and time.  Psychiatric:        Behavior: Behavior normal.    Review of Systems  Constitutional: Negative.   HENT: Negative.    Eyes: Negative.   Respiratory: Negative.    Cardiovascular: Negative.   Gastrointestinal: Negative.   Genitourinary: Negative.   Musculoskeletal: Negative.   Skin: Negative.   Neurological: Negative.   Endo/Heme/Allergies: Negative.   Psychiatric/Behavioral:  Positive for depression and substance abuse. The patient is nervous/anxious and has insomnia.    Blood pressure 113/73, pulse (!) 116, temperature 97.9 F (36.6 C), temperature source Oral, resp. rate 16, height 5\' 6"  (1.676 m), weight 75.8 kg, SpO2 97 %. Body mass index is 26.95 kg/m.  Treatment Plan Summary: Daily contact with patient to assess and evaluate symptoms and progress in treatment and Medication management  Observation Level/Precautions:  15 minute checks  Laboratory:  CBC Chemistry Profile HbAIC HCG UDS UA  Psychotherapy:    Medications:    Consultations:    Discharge Concerns:    Estimated LOS:  Other:      Physician Treatment Plan for Primary Diagnosis: Bipolar 1 disorder, depressed, severe (HCC) Long Term Goal(s): Improvement in symptoms so as ready for discharge   Short Term Goals: Ability to identify changes in lifestyle to reduce recurrence of condition will improve, Ability to verbalize feelings will improve, Ability to disclose and discuss suicidal ideas, Ability to demonstrate self-control will improve, Ability to identify and develop effective coping behaviors will  improve, Ability to maintain clinical measurements within normal limits will improve, Compliance  with prescribed medications will improve, and Ability to identify triggers associated with substance abuse/mental health issues will improve   Physician Treatment Plan for Secondary Diagnosis: Principal Problem:   Bipolar 1 disorder, depressed, severe (HCC) Active Problems:   Anxiety   Depression   Bipolar 1 disorder (HCC)   Plan: Bipolar 1 Disorder: -- Initiate Lurasidone (Latuda) tablet 40 mg p.o. daily with breakfast -- Initiate lamotrigine 100 mg p.o. twice daily -- Resume Topamax 100 mg tablets p.o. daily Vang medication   Depression: -- Initiate Remeron 45 mg tablets p.o. daily at bedtime   Anxiety: -- Initiate hydroxyzine 50 mg p.o. twice daily as needed   Insomnia: -- Initiate trazodone 100 mg p.o. q. Nightly   Agitation protocol: Risperidone disintegrating tab 2 mg p.o. every 8 hours as needed agitation and Lorazepam tablet 1 mg p.o. as needed anxiety, severe agitation for 1 dose only and Ziprasidone Geodon injection 20 mg IM as needed agitation for 1 dose only            Other PRN Medications -Acetaminophen 650 mg every 6 as needed/mild pain -Maalox 30 mL oral every 4 as needed/digestion -Magnesium hydroxide 30 mL daily as needed/mild constipation -- Zofran disintegrating tab 4 mg p.o. every 8 hours as needed for nausea or vomiting  Discharge Planning: Social work and case management to assist with discharge planning and identification of hospital follow-up needs prior to discharge Estimated LOS: 5-7 days Discharge Concerns: Need to establish a safety plan; Medication compliance and effectiveness Discharge Goals: Return [facility]ang with outpatient referrals for mental health follow-up including medication management/psychotherapy.  Cecilie Lowers, FNP 01/26/2022, 3:30 PM   I certify that inpatient services furnished can reasonably be expected to improve the patient's condition.     Cecilie Lowers, FNP 01/26/2022, 3:23 PM
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
## 2022-01-26 NOTE — BHH Group Notes (Signed)
Adult Psychoeducational Group  Date:  01/26/2022 Time:  1100-1200  Group Topic/Focus: Continuation of the group from Saturday. Looking at the lists that were created and talking about what needs to be done with the homework of 30 positives about themselves.                                     Talking about taking their power back and helping themselves to develop a positive self esteem.      Participation Quality:  did not attend  Amair Shrout A   

## 2022-01-26 NOTE — Progress Notes (Signed)
Nursing Note: 0700-1900  D:   Goal for today: " Talk to doctor about medication for anxiety."  Pt reports that she slept "much better" last night, appetite is fair and she is tolerating prescribed medication without side effects.  Rates that anxiety is  8/10, hopelessness 8/10 and depression 6/10 this am.  Pt received prn Risperdal as ordered this am for anxiety, later in afternoon she c/o severe anxiety and Lorazapam 1mg  PO given prn.  Pt denies A/V hallucinations and is able to verbally contract for safety. A:  Pt. encouraged to verbalize needs and concerns, active listening and support provided.  Continued Q 15 minute safety checks.  Observed active participation in group settings. R:  Pt. is pleasant and cooperative.     01/26/22 1000  Psych Admission Type (Psych Patients Only)  Admission Status Voluntary  Psychosocial Assessment  Patient Complaints Anxiety  Eye Contact Fair  Facial Expression Anxious  Affect Appropriate to circumstance  Speech Logical/coherent  Interaction Assertive  Motor Activity Slow  Appearance/Hygiene Unremarkable  Behavior Characteristics Appropriate to situation  Mood Anxious  Thought Process  Coherency WDL  Content WDL  Delusions None reported or observed  Perception WDL  Hallucination None reported or observed  Judgment Impaired  Confusion None  Danger to Self  Current suicidal ideation? Denies  Agreement Not to Harm Self Yes  Description of Agreement Verbal  Danger to Others  Danger to Others None reported or observed

## 2022-01-27 ENCOUNTER — Encounter (HOSPITAL_COMMUNITY): Payer: Self-pay

## 2022-01-27 DIAGNOSIS — F314 Bipolar disorder, current episode depressed, severe, without psychotic features: Secondary | ICD-10-CM

## 2022-01-27 LAB — URINALYSIS, COMPLETE (UACMP) WITH MICROSCOPIC
Bilirubin Urine: NEGATIVE
Glucose, UA: NEGATIVE mg/dL
Hgb urine dipstick: NEGATIVE
Ketones, ur: NEGATIVE mg/dL
Nitrite: NEGATIVE
Protein, ur: NEGATIVE mg/dL
Specific Gravity, Urine: 1.021 (ref 1.005–1.030)
pH: 5 (ref 5.0–8.0)

## 2022-01-27 LAB — PREGNANCY, URINE: Preg Test, Ur: NEGATIVE

## 2022-01-27 MED ORDER — PROPRANOLOL HCL 10 MG PO TABS
10.0000 mg | ORAL_TABLET | Freq: Two times a day (BID) | ORAL | Status: DC
  Administered 2022-01-27 – 2022-01-29 (×5): 10 mg via ORAL
  Filled 2022-01-27 (×10): qty 1

## 2022-01-27 MED ORDER — LURASIDONE HCL 60 MG PO TABS
60.0000 mg | ORAL_TABLET | Freq: Every day | ORAL | Status: DC
  Administered 2022-01-28 – 2022-01-29 (×2): 60 mg via ORAL
  Filled 2022-01-27 (×3): qty 1

## 2022-01-27 MED ORDER — GABAPENTIN 100 MG PO CAPS
100.0000 mg | ORAL_CAPSULE | Freq: Three times a day (TID) | ORAL | Status: DC
  Administered 2022-01-27 – 2022-01-29 (×6): 100 mg via ORAL
  Filled 2022-01-27 (×10): qty 1

## 2022-01-27 NOTE — Progress Notes (Signed)
   01/27/22 0559  15 Minute Checks  Location Bedroom  Visual Appearance Calm  Behavior Sleeping  Sleep (Behavioral Health Patients Only)  Calculate sleep? (Click Yes once per 24 hr at 0600 safety check) Yes  Documented sleep last 24 hours 8.25

## 2022-01-27 NOTE — BHH Group Notes (Signed)
Pt attended AA group 

## 2022-01-27 NOTE — BH IP Treatment Plan (Signed)
Interdisciplinary Treatment and Diagnostic Plan Update  01/27/2022 Time of Session: 10:05am [name] MRN: [id number]  Principal Diagnosis: Bipolar 1 disorder, depressed, severe ([place])  Secondary Diagnoses: Principal Problem:   Bipolar 1 disorder, depressed, severe ([place]) Active Problems:   Anxiety   Depression   Bipolar 1 disorder ([place])   Current Medications:  Current Facility-Administered Medications  Medication Dose Route Frequency Provider Last Rate Last Admin   acetaminophen (TYLENOL) tablet 650 mg  650 mg Oral Q6H PRN Leevy-Johnson, Brooke A, NP   650 mg at 01/25/22 1608   alum & mag hydroxide-simeth (MAALOX/MYLANTA) 200-200-20 MG/5ML suspension 30 mL  30 mL Oral Q4H PRN Leevy-Johnson, Brooke A, NP       gabapentin (NEURONTIN) capsule 100 mg  100 mg Oral TID Janine Limbo, MD   100 mg at 01/27/22 1051   hydrOXYzine (ATARAX) tablet 50 mg  50 mg Oral TID PRN Ntuen, Kris Hartmann, FNP       influenza vac split quadrivalent PF (FLUARIX) injection 0.5 mL  0.5 mL Intramuscular Tomorrow-1000 Nkwenti, Doris, NP       lamoTRIgine (LAMICTAL) tablet 100 mg  100 mg Oral BID Laretta Bolster, FNP   100 mg at 01/27/22 0735   [START ON 01/28/2022] Lurasidone HCl TABS 60 mg  60 mg Oral Q breakfast Massengill, Nathan, MD       magnesium hydroxide (MILK OF MAGNESIA) suspension 30 mL  30 mL Oral Daily PRN Leevy-Johnson, Brooke A, NP       mirtazapine (REMERON SOL-TAB) disintegrating tablet 45 mg  45 mg Oral QHS Ntuen, Tina C, FNP   45 mg at 01/26/22 2106   nicotine (NICODERM CQ - dosed in mg/24 hours) patch 21 mg  21 mg Transdermal Daily Nicholes Rough, NP   21 mg at 01/27/22 0736   ondansetron (ZOFRAN-ODT) disintegrating tablet 4 mg  4 mg Oral Q8H PRN Ntuen, Kris Hartmann, FNP       pneumococcal 20-valent conjugate vaccine (PREVNAR 20) injection 0.5 mL  0.5 mL Intramuscular Tomorrow-1000 Massengill, Nathan, MD       propranolol (INDERAL) tablet 10 mg  10 mg Oral Q12H Massengill, Nathan, MD   10 mg at 01/27/22  1137   risperiDONE (RISPERDAL M-TABS) disintegrating tablet 2 mg  2 mg Oral Q8H PRN Leevy-Johnson, Brooke A, NP   2 mg at 01/25/22 1606   And   ziprasidone (GEODON) injection 20 mg  20 mg Intramuscular PRN Leevy-Johnson, Brooke A, NP       topiramate (TOPAMAX) tablet 100 mg  100 mg Oral Daily Ntuen, Tina C, FNP   100 mg at 01/27/22 0735   traZODone (DESYREL) tablet 100 mg  100 mg Oral QHS PRN Laretta Bolster, FNP   100 mg at 01/26/22 2106   PTA Medications: Medications Prior to Admission  Medication Sig Dispense Refill Last Dose   lamoTRIgine (LAMICTAL) 200 MG tablet Take 200 mg by mouth daily.   Past Week   Lurasidone HCl 120 MG TABS Take 1 tablet by mouth daily.   Past Month   mirtazapine (REMERON) 45 MG tablet Take 45 mg by mouth at bedtime.   Past Week   topiramate (TOPAMAX) 100 MG tablet Take 100 mg by mouth daily.   Past Week   VENTOLIN HFA 108 (90 Base) MCG/ACT inhaler Inhale 2 puffs into the lungs every 6 (six) hours as needed.   Past Month   traZODone (DESYREL) 50 MG tablet Take 1 tablet (50 mg total) by mouth at bedtime  as needed for sleep. 30 tablet 0     Patient Stressors: Loss of grandmother Christmas weekend   Medication change or noncompliance    Patient Strengths: Average or above average intelligence  Capable of independent living  Communication skills  Supportive family/friends   Treatment Modalities: Medication Management, Group therapy, Case management,  1 to 1 session with clinician, Psychoeducation, Recreational therapy.   Physician Treatment Plan for Primary Diagnosis: Bipolar 1 disorder, depressed, severe ([place]) Long Term Goal(s): Improvement in symptoms so as ready for discharge   Short Term Goals: Ability to identify changes in lifestyle to reduce recurrence of condition will improve Ability to verbalize feelings will improve Ability to disclose and discuss suicidal ideas Ability to demonstrate self-control will improve Ability to identify and develop  effective coping behaviors will improve Ability to maintain clinical measurements within normal limits will improve Compliance with prescribed medications will improve Ability to identify triggers associated with substance abuse/mental health issues will improve  Medication Management: Evaluate patient's response, side effects, and tolerance of medication regimen.  Therapeutic Interventions: 1 to 1 sessions, Unit Group sessions and Medication administration.  Evaluation of Outcomes: Progressing  Physician Treatment Plan for Secondary Diagnosis: Principal Problem:   Bipolar 1 disorder, depressed, severe ([place]) Active Problems:   Anxiety   Depression   Bipolar 1 disorder ([place])  Long Term Goal(s): Improvement in symptoms so as ready for discharge   Short Term Goals: Ability to identify changes in lifestyle to reduce recurrence of condition will improve Ability to verbalize feelings will improve Ability to disclose and discuss suicidal ideas Ability to demonstrate self-control will improve Ability to identify and develop effective coping behaviors will improve Ability to maintain clinical measurements within normal limits will improve Compliance with prescribed medications will improve Ability to identify triggers associated with substance abuse/mental health issues will improve     Medication Management: Evaluate patient's response, side effects, and tolerance of medication regimen.  Therapeutic Interventions: 1 to 1 sessions, Unit Group sessions and Medication administration.  Evaluation of Outcomes: Progressing   RN Treatment Plan for Primary Diagnosis: Bipolar 1 disorder, depressed, severe ([place]) Long Term Goal(s): Knowledge of disease and therapeutic regimen to maintain health will improve  Short Term Goals: Ability to remain free from injury will improve, Ability to verbalize frustration and anger appropriately will improve, Ability to demonstrate self-control, Ability to  participate in decision making will improve, Ability to verbalize feelings will improve, Ability to disclose and discuss suicidal ideas, Ability to identify and develop effective coping behaviors will improve, and Compliance with prescribed medications will improve  Medication Management: RN will administer medications as ordered by provider, will assess and evaluate patient's response and provide education to patient for prescribed medication. RN will report any adverse and/or side effects to prescribing provider.  Therapeutic Interventions: 1 on 1 counseling sessions, Psychoeducation, Medication administration, Evaluate responses to treatment, Monitor vital signs and CBGs as ordered, Perform/monitor CIWA, COWS, AIMS and Fall Risk screenings as ordered, Perform wound care treatments as ordered.  Evaluation of Outcomes: Progressing   LCSW Treatment Plan for Primary Diagnosis: Bipolar 1 disorder, depressed, severe ([place]) Long Term Goal(s): Safe transition to appropriate next level of care at discharge, Engage patient in therapeutic group addressing interpersonal concerns.  Short Term Goals: Engage patient in aftercare planning with referrals and resources, Increase social support, Increase ability to appropriately verbalize feelings, Increase emotional regulation, Facilitate acceptance of mental health diagnosis and concerns, Facilitate patient progression through stages of change regarding substance use diagnoses and  concerns, Identify triggers associated with mental health/substance abuse issues, and Increase skills for wellness and recovery  Therapeutic Interventions: Assess for all discharge needs, 1 to 1 time with Social worker, Explore available resources and support systems, Assess for adequacy in community support network, Educate family and significant other(s) on suicide prevention, Complete Psychosocial Assessment, Interpersonal group therapy.  Evaluation of Outcomes:  Progressing   Progress in Treatment: Attending groups: Yes. Participating in groups: Yes. Taking medication as prescribed: Yes. Toleration medication: Yes. Family/Significant other contact made: No, will contact:   friend Corwin Levins [phone] Patient understands diagnosis: Yes. Discussing patient identified problems/goals with staff: friend Corwin Levins [phone] Medical problems stabilized or resolved: Yes. Denies suicidal/homicidal ideation: Yes. Issues/concerns per patient self-inventory: No.   New problem(s) identified: No, Describe:  none reported   New Short Term/Long Term Goal(s):   medication stabilization, elimination of SI thoughts, development of comprehensive mental wellness plan.    Patient Goals:  Pt states, "I would like to get stable on medications"  Discharge Plan or Barriers: Patient recently admitted. CSW will continue to follow and assess for appropriate referrals and possible discharge planning.    Reason for Continuation of Hospitalization: Anxiety Depression Medication stabilization Suicidal ideation Withdrawal symptoms  Estimated Length of Stay:c 3-5 days  Last 3 [place] Suicide Severity Risk Score: Flowsheet Row Admission (Current) [facility] from [facility]03/2021 [facility]  C-SSRS RISK CATEGORY Low Risk No Risk No Risk       Last PHQ 2/9 Scores:    04/09/2018    9:22 AM 01/12/2018    9:45 AM 12/15/2017    9:28 AM  Depression screen PHQ 2/9  Decreased Interest 1 1 2   Down, Depressed, Hopeless 1 2 3   PHQ - 2 Score 2 3 5   Altered sleeping 3 3 2   Tired, decreased energy 2 3 3   Change in appetite 1 3 2   Feeling bad or failure about yourself  1 2 3   Trouble concentrating 1 3 2   Moving slowly or fidgety/restless 1 0 0  Suicidal thoughts 0 0 0   PHQ-9 Score 11 17 17     Scribe for Treatment Team: , LCSW 01/27/2022 1:24 PM

## 2022-01-27 NOTE — Progress Notes (Addendum)
D: Pt denied SI/HI/AVH this morning. Pt rated her depression a 7/10 and her anxiety a 9/10. Pt has been tearful throughout the day, stating "I am just really anxious today". Pt had a crying spell around lunchtime due to feeling anxious, pt unable to pinpoint what is triggering her anxiety. RN provided encouragement and motivated pt to call her mother to ask her to come visitation her today for extended visitation hours. Pt decided to try coping mechanisms such as coloring to help manage her anxiety. Pt's mood appears to have improved following interaction with peers in the dayroom. Pt has been interactive on the unit throughout the day. Pt has been cooperative throughout the day.  A: RN provided support and encouragement to patient. Pt given scheduled medications as prescribed. EKG completed. Q15 min checks verified for safety.    R: Patient verbally contracts for safety. Patient compliant with medications and treatment plan. Patient is interacting well on the unit. Pt is safe on the unit.   01/27/22 1133  Psych Admission Type (Psych Patients Only)  Admission Status Voluntary  Psychosocial Assessment  Patient Complaints Anxiety;Depression;Crying spells  Eye Contact Fair  Facial Expression Sad  Affect Anxious;Depressed  Speech Logical/coherent  Interaction Assertive  Motor Activity Other (Comment) (WDL)  Appearance/Hygiene Unremarkable  Behavior Characteristics Anxious  Mood Depressed;Anxious;Sad  Thought Process  Coherency WDL  Content WDL  Delusions None reported or observed  Perception WDL  Hallucination None reported or observed  Judgment Poor  Confusion None  Danger to Self  Current suicidal ideation? Denies  Agreement Not to Harm Self Yes  Description of Agreement Verbal Contract  Danger to Others  Danger to Others None reported or observed

## 2022-01-27 NOTE — Progress Notes (Signed)
Patient had an episode of crying spell this evening after group. Patient was crying uncontrollably. Patient stated, "I am frustrated and it is too much for Me." Patient was unable to tell what is causing her anxiety. RN provided emotional support and encouraged patient to take deep breaths. Patient reports anxiety 8/10, denies SI,HI, and AVH.  PRN hydroxyzine administered  at 2125 for anxiety per MAR. Patient received and tolerated scheduled meds without issues. Patient later verbalized, "I'm feeling a lot better." Patient verbally contracts for safety, Q 15 minutes safety checks maintained, Patient maintained safe on the unit.

## 2022-01-27 NOTE — BHH Group Notes (Signed)
Pt did not attend goals group. 

## 2022-01-27 NOTE — Progress Notes (Signed)
[facility] MD Progress Note  01/27/2022 3:38 PM  [name]   MRN:  [id number]  Reason for admission: 27 y.o. female with a past psychiatric history of bipolar I disorder depressed severe, anxiety, depression, suicidal ideations, and inpatient psychiatric hospitalizations.  She is admitted to the [facility] after presenting  with a friend  as a voluntary walk-in for assessment for being off her psychotropic medications x 2 months, staying up x 3 days without sleep, increased emotional lability, racing thoughts, in the context of grandmother's death, break-up with boyfriend, and custody battle for her 37 years old daughter.     Daily notes: [name] is seen, chart reviewed. The chart findings discussed with the treatment team. She presents alert, oriented & aware of situation. She is visible on the unit, attending group sessions. She presents with a flat/teary affect. She reports, "I have been in this hospital since Friday trying to get stable with my medicines. I'm still a mess. My anxiety is so high. I'm getting something for it now, hopefully, it will come down soon. I'm very upset now because I just finished talking with my ex. He is with my daughter. He is not happy that I'm in the hospital. He hung up on me as well. I do not want anyone to reach out or talk to my ex [name]. He would not let me talk my daughter. I only give permission for you guys to talk to my other friend [name].. That is who I want you guys to call on my behalf.  [name] is only there for emergency contact. I'm pissed off right. My mood is not good. I did not attend the morning group because I was so upset. I'm taking the medicines, no side effects". [name] was also seen by the attending psychiatrist. She presented during his evaluation with similar complaints. Her medications has been adjusted to meet her needs. Patient is instructed & encouraged to try to focus on her treatment & be rest assured that she is in the hospital trying to get help for her  mood symptoms. That she is not doing anything wrong or that will cause to lose her daughter. She is encouraged to attend group sessions to help her learn coping skills. Discussed with the treatment team that [name] does not want anyone from [name] to contact [name] but has her consent to talk to [name]. [name] currently denies any SIHI, AVH, delusional thoughts or paranoia. She does not appear to be responding to any internal stimuli. Will continue current plan of care as already in progress. Reviewed. Vital signs, pulse rate is elevated (117). Patient is started on propranolol 10 mg bid.  Principal Problem: Bipolar 1 disorder, depressed, severe (HCC)  Diagnosis: Principal Problem:   Bipolar 1 disorder, depressed, severe (HCC) Active Problems:   Anxiety   Depression   Bipolar 1 disorder (HCC)  Total Time spent with patient: 30 minutes  Past Psychiatric History: Previous Psych Diagnoses:  Prior inpatient treatment: Yes Current/prior outpatient treatment: Yes Prior rehab hx: No Psychotherapy hx: Yes History of suicide: Yes, attempted suicide via OD on medication in 2015 History of homicide or aggression: No Psychiatric medication history: Yes, see HPI Psychiatric medication compliance history: Yes Neuromodulation history: No Current Psychiatrist: Psychiatrist at [facility] Current therapist: None  Past Medical History:  Past Medical History:  Diagnosis Date   Allergic conjunctivitis    Allergy    Anxiety 06/04/2011   panic attacks   Astigmatism    glasses   Bipolar 1 disorder (  [place])    Borderline personality disorder ([place])    Depression    Obesity    Post traumatic stress disorder    Urinary tract infection 08/17/93   recurrent, nl [facility], [facility])    Past Surgical History:  Procedure Laterality Date   INGUINAL HERNIA REPAIR  7/95   bilat exploration, left repair   UMBILICAL HERNIA REPAIR  7/95   repaired as infant   Family History:  Family History  Problem Relation Age of Onset    Hypertension Mother    Hypothyroidism Mother    Hypertension Father    Brain cancer Maternal Grandfather    Cancer Other    Family Psychiatric  History: Psych: Mother diagnosed with bipolar, brother diagnosed with bipolar, depression, anxiety, PTSD.  Father has history of homicidal ideation and killed patient's stepfather in 2014, currently in prison. Psych Rx: Yes SA/HA: Mom shot herself in the stomach for suicide attempt 22 years ago.  And father had HA as indicated above. Substance use family hx: Yes.  Father and mom alcoholics.  Mother abuses pain pills    Social History:  Social History   Substance and Sexual Activity  Alcohol Use No     Social History   Substance and Sexual Activity  Drug Use Yes   Types: Other-see comments   Comment: pt uses delta 8    Social History   Socioeconomic History   Marital status: Single    Spouse name: Not on file   Number of children: Not on file   Years of education: Not on file   Highest education level: Not on file  Occupational History   Not on file  Tobacco Use   Smoking status: Every Day    Packs/day: 1.00    Types: Cigarettes   Smokeless tobacco: Current   Tobacco comments:    Vapes delta 8  Vaping Use   Vaping Use: Every day  Substance and Sexual Activity   Alcohol use: No   Drug use: Yes    Types: Other-see comments    Comment: pt uses delta 8   Sexual activity: Not Currently    Birth control/protection: Pill, Condom  Other Topics Concern   Not on file  Social History Narrative   Graduated HS   27 years old   Living with grandmother   Moving to [place] Determinants of Health   Financial Resource Strain: Unknown (10/18/2018)   Overall Financial Resource Strain (CARDIA)    Difficulty of Paying Living Expenses: Patient refused  Food Insecurity: No Food Insecurity (01/24/2022)   Hunger Vital Sign    Worried About Running Out of Food in the Last Year: Never true    Ran Out of Food in the  Last Year: Never true  Transportation Needs: No Transportation Needs (01/24/2022)   PRAPARE - Hydrologist (Medical): No    Lack of Transportation (Non-Medical): No  Physical Activity: Unknown (10/18/2018)   Exercise Vital Sign    Days of Exercise per Week: Patient refused    Minutes of Exercise per Session: Patient refused  Stress: Not on file  Social Connections: Unknown (10/18/2018)   Social Connection and Isolation Panel [NHANES]    Frequency of Communication with Friends and Family: Patient refused    Frequency of Social Gatherings with Friends and Family: Patient refused    Attends Religious Services: Patient refused    Active Member of Clubs or Organizations: Patient refused  Attends Banker Meetings: Patient refused    Marital Status: Patient refused   Additional Social History:   Sleep: Good  Appetite:  Good  Current Medications: Current Facility-Administered Medications  Medication Dose Route Frequency Provider Last Rate Last Admin   acetaminophen (TYLENOL) tablet 650 mg  650 mg Oral Q6H PRN Leevy-Johnson, Brooke A, NP   650 mg at 01/25/22 1608   alum & mag hydroxide-simeth (MAALOX/MYLANTA) 200-200-20 MG/5ML suspension 30 mL  30 mL Oral Q4H PRN Leevy-Johnson, Brooke A, NP       gabapentin (NEURONTIN) capsule 100 mg  100 mg Oral TID Phineas Inches, MD   100 mg at 01/27/22 1051   hydrOXYzine (ATARAX) tablet 50 mg  50 mg Oral TID PRN Ntuen, Jesusita Oka, FNP       influenza vac split quadrivalent PF (FLUARIX) injection 0.5 mL  0.5 mL Intramuscular Tomorrow-1000 Nkwenti, Doris, NP       lamoTRIgine (LAMICTAL) tablet 100 mg  100 mg Oral BID Cecilie Lowers, FNP   100 mg at 01/27/22 0735   [START ON 01/28/2022] Lurasidone HCl TABS 60 mg  60 mg Oral Q breakfast Massengill, Nathan, MD       magnesium hydroxide (MILK OF MAGNESIA) suspension 30 mL  30 mL Oral Daily PRN Leevy-Johnson, Brooke A, NP       mirtazapine (REMERON SOL-TAB) disintegrating  tablet 45 mg  45 mg Oral QHS Ntuen, Tina C, FNP   45 mg at 01/26/22 2106   nicotine (NICODERM CQ - dosed in mg/24 hours) patch 21 mg  21 mg Transdermal Daily Starleen Blue, NP   21 mg at 01/27/22 0736   ondansetron (ZOFRAN-ODT) disintegrating tablet 4 mg  4 mg Oral Q8H PRN Ntuen, Jesusita Oka, FNP       pneumococcal 20-valent conjugate vaccine (PREVNAR 20) injection 0.5 mL  0.5 mL Intramuscular Tomorrow-1000 Massengill, Nathan, MD       propranolol (INDERAL) tablet 10 mg  10 mg Oral Q12H Massengill, Harrold Donath, MD   10 mg at 01/27/22 1137   risperiDONE (RISPERDAL M-TABS) disintegrating tablet 2 mg  2 mg Oral Q8H PRN Leevy-Johnson, Brooke A, NP   2 mg at 01/25/22 1606   And   ziprasidone (GEODON) injection 20 mg  20 mg Intramuscular PRN Leevy-Johnson, Brooke A, NP       topiramate (TOPAMAX) tablet 100 mg  100 mg Oral Daily Ntuen, Jesusita Oka, FNP   100 mg at 01/27/22 0735   traZODone (DESYREL) tablet 100 mg  100 mg Oral QHS PRN Cecilie Lowers, FNP   100 mg at 01/26/22 2106   Lab Results:  Results for orders placed or performed during the hospital encounter of 01/24/22 (from the past 48 hour(s))  Urinalysis, Complete w Microscopic Urine, Clean Catch     Status: Abnormal   Collection Time: 01/26/22  7:43 AM  Result Value Ref Range   Color, Urine AMBER (A) YELLOW    Comment: BIOCHEMICALS MAY BE AFFECTED BY COLOR   APPearance TURBID (A) CLEAR   Specific Gravity, Urine 1.021 1.005 - 1.030   pH 5.0 5.0 - 8.0   Glucose, UA NEGATIVE NEGATIVE mg/dL   Hgb urine dipstick NEGATIVE NEGATIVE   Bilirubin Urine NEGATIVE NEGATIVE   Ketones, ur NEGATIVE NEGATIVE mg/dL   Protein, ur NEGATIVE NEGATIVE mg/dL   Nitrite NEGATIVE NEGATIVE   Leukocytes,Ua LARGE (A) NEGATIVE   RBC / HPF 11-20 0 - 5 RBC/hpf   WBC, UA 21-50 0 - 5 WBC/hpf  Bacteria, UA RARE (A) NONE SEEN   Squamous Epithelial / LPF 11-20 0 - 5 /HPF   Mucus PRESENT     Comment: Performed at [facility], [place] [address] [place], [place]  [address]  Pregnancy, urine     Status: None   Collection Time: 01/26/22  7:49 AM  Result Value Ref Range   Preg Test, Ur NEGATIVE NEGATIVE    Comment:        THE SENSITIVITY OF THIS METHODOLOGY IS >20 mIU/mL. Performed at [facility], [place] [address], [place], [place] [address]   Comprehensive metabolic panel     Status: Abnormal   Collection Time: 01/26/22  6:29 PM  Result Value Ref Range   Sodium 136 135 - 145 mmol/L   Potassium 3.4 (L) 3.5 - 5.1 mmol/L   Chloride 108 98 - 111 mmol/L   CO2 19 (L) 22 - 32 mmol/L   Glucose, Bld 108 (H) 70 - 99 mg/dL    Comment: Glucose reference range applies only to samples taken after fasting for at least 8 hours.   BUN 16 6 - 20 mg/dL   Creatinine, Ser 0.73 0.44 - 1.00 mg/dL   Calcium 9.1 8.9 - 10.3 mg/dL   Total Protein 6.9 6.5 - 8.1 g/dL   Albumin 4.3 3.5 - 5.0 g/dL   AST 15 15 - 41 U/L   ALT 12 0 - 44 U/L   Alkaline Phosphatase 54 38 - 126 U/L   Total Bilirubin 0.4 0.3 - 1.2 mg/dL   GFR, Estimated >60 >60 mL/min    Comment: (NOTE) Calculated using the CKD-EPI Creatinine Equation (2021)    Anion gap 9 5 - 15    Comment: Performed at [facility], [place] [address], [place], [place] [address]  TSH     Status: None   Collection Time: 01/26/22  6:29 PM  Result Value Ref Range   TSH 1.531 0.350 - 4.500 uIU/mL    Comment: Performed by a 3rd Generation assay with a functional sensitivity of <=0.01 uIU/mL. Performed at [facility], [place] [address] [place], [place] [address]   CBC with Differential/Platelet     Status: None   Collection Time: 01/26/22  6:29 PM  Result Value Ref Range   WBC 7.6 4.0 - 10.5 K/uL   RBC 4.27 3.87 - 5.11 MIL/uL   Hemoglobin 13.3 12.0 - 15.0 g/dL   HCT 40.8 36.0 - 46.0 %   MCV 95.6 80.0 - 100.0 fL   MCH 31.1 26.0 - 34.0 pg   MCHC 32.6 30.0 - 36.0 g/dL   RDW 13.2 11.5 - 15.5 %   Platelets 258 150 - 400 K/uL   nRBC 0.0 0.0 - 0.2 %   Neutrophils Relative %  53 %   Neutro Abs 4.1 1.7 - 7.7 K/uL   Lymphocytes Relative 34 %   Lymphs Abs 2.6 0.7 - 4.0 K/uL   Monocytes Relative 9 %   Monocytes Absolute 0.7 0.1 - 1.0 K/uL   Eosinophils Relative 3 %   Eosinophils Absolute 0.2 0.0 - 0.5 K/uL   Basophils Relative 1 %   Basophils Absolute 0.1 0.0 - 0.1 K/uL   Immature Granulocytes 0 %   Abs Immature Granulocytes 0.02 0.00 - 0.07 K/uL    Comment: Performed at [facility], [place] [address], [place],  [address]    Blood Alcohol level:  Lab Results  Component Value Date   [facility] <11 05/08/2011  ETH <11 08/31/2010    Metabolic Disorder Labs: Lab Results  Component Value Date   HGBA1C 5.0 10/19/2018   MPG 97 10/19/2018   MPG 93.93 11/07/2016   No results found for: "PROLACTIN" Lab Results  Component Value Date   CHOL 191 10/19/2018   TRIG 82 10/19/2018   HDL 56 10/19/2018   CHOLHDL 3.4 10/19/2018   VLDL 16 10/19/2018   LDLCALC 119 (H) 10/19/2018   LDLCALC 96 11/07/2016    Physical Findings: AIMS: Facial and Oral Movements Muscles of Facial Expression: None, normal Lips and Perioral Area: None, normal Jaw: None, normal Tongue: None, normal,Extremity Movements Upper (arms, wrists, hands, fingers): None, normal Lower (legs, knees, ankles, toes): None, normal, Trunk Movements Neck, shoulders, hips: None, normal, Overall Severity Severity of abnormal movements (highest score from questions above): None, normal Incapacitation due to abnormal movements: None, normal Patient's awareness of abnormal movements (rate only patient's report): No Awareness, Dental Status Current problems with teeth and/or dentures?: No Does patient usually wear dentures?: No  CIWA:    COWS:     Musculoskeletal: Strength & Muscle Tone: within normal limits Gait & Station: normal Patient leans: N/A  Psychiatric Specialty Exam:  Presentation  General Appearance:  Appropriate for Environment; Casual; Fairly Groomed  Eye  Contact: Fair  Speech: Clear and Coherent  Speech Volume: Normal  Handedness: Right  Mood and Affect  Mood: Anxious; Depressed  Affect: Congruent; Appropriate  Thought Process  Thought Processes: Coherent  Descriptions of Associations:Intact  Orientation:Full (Time, Place and Person)  Thought Content:Logical  History of Schizophrenia/Schizoaffective disorder:No data recorded Duration of Psychotic Symptoms:No data recorded Hallucinations:Hallucinations: None  Ideas of Reference:None  Suicidal Thoughts:Suicidal Thoughts: No  Homicidal Thoughts:Homicidal Thoughts: No  Sensorium  Memory: Immediate Fair; Recent Fair; Remote Fair  Judgment: Fair  Insight: Fair  Art therapist  Concentration: Good  Attention Span: Fair  Recall: [employer] of Knowledge: Fair  Language: Fair  Psychomotor Activity  Psychomotor Activity: Psychomotor Activity: Normal  Assets  Assets: Communication Skills; Desire for Improvement; Physical Health; Social Support  Sleep  Sleep: Sleep: Good Number of Hours of Sleep: 6  Physical Exam: Physical Exam Vitals and nursing note reviewed.  HENT:     Head: Normocephalic.     Nose: Nose normal.     Mouth/Throat:     Pharynx: Oropharynx is clear.  Eyes:     Pupils: Pupils are equal, round, and reactive to light.  Cardiovascular:     Rate and Rhythm: Normal rate.     Comments: Elevated pulse rate: 117. Patient is currently is no apparent distress. Pulmonary:     Effort: Pulmonary effort is normal.  Abdominal:     Palpations: Abdomen is soft.  Genitourinary:    Comments: Deferred Musculoskeletal:        General: Normal range of motion.     Cervical back: Normal range of motion.  Skin:    General: Skin is warm.  Neurological:     General: No focal deficit present.     Mental Status: She is oriented to person, place, and time.  Psychiatric:        Behavior: Behavior normal.    Review of Systems   Constitutional: Negative.   HENT: Negative.    Eyes: Negative.   Respiratory: Negative.    Cardiovascular: Negative.   Gastrointestinal: Negative.   Genitourinary: Negative.   Musculoskeletal: Negative.   Skin: Negative.   Neurological: Negative.   Endo/Heme/Allergies: Negative.   Psychiatric/Behavioral:  Positive for depression  and substance abuse. The patient is nervous/anxious and has insomnia.    Blood pressure 105/80, pulse (!) 117, temperature 97.8 F (36.6 C), temperature source Oral, resp. rate 16, height 5\' 6"  (1.676 m), weight 75.8 kg, SpO2 99 %. Body mass index is 26.95 kg/m.  Treatment Plan Summary: Daily contact with patient to assess and evaluate symptoms and progress in treatment and Medication management.   Continue inpatient hospitalization.  Will continue today 01/27/2022 plan as below except where it is noted.    Diagnosis/ principal Problem:   Bipolar 1 disorder, depressed, severe (HCC)   Plan -Increased Latuda from 40 mg to 60 mg po daily for mood stabilization. -Continue lamotrigine 100 mg po bid daily for mood stabilization. -Continue Topamax 100 mg po daily for moo stabilization. -Continue Trazodone 100 mg po Q hs prn for insomnia.   Depression: -Continue mirtazapine-ODT 45 mg po Q hs.   Anxiety: -Continue hydroxyzine 50 mg po tid prn.  -Continue gabapentin 100 mg po tid.  -Continue Propranolol 10 mg po bid for anxiety/elevated heart rate.   Insomnia: Continue trazodone 100 mg po Q hs.   Agitation protocols: Continue as recommended below. Risperidone disintegrating tab 2 mg p.o. every 8 hours as needed agitation and Lorazepam tablet 1 mg p.o. as needed anxiety, severe agitation for 1 dose only and Ziprasidone Geodon injection 20 mg IM as needed agitation for 1 dose only            Other PRN Medications -Continue Acetaminophen 650 mg po Q 6 hrs prn for fever/pain -Continue Maalox 30 mL po Q 4 hrs prn digestion -Continue Magnesium hydroxide 30  mL po daily for constipation -Continue Zofran-odt 4 mg p.o. every 8 hours as needed for nausea or vomiting  Discharge Planning: Social work and case management to assist with discharge planning and identification of hospital follow-up needs prior to discharge Estimated LOS: 5-7 days Discharge Concerns: Need to establish a safety plan; Medication compliance and effectiveness Discharge Goals: Return home with outpatient referrals for mental health follow-up including medication management/psychotherapy.   I certify that inpatient services furnished can reasonably be expected to improve the patient's condition.     Armandina StammerAgnes Sewell Pitner, NP, pmhnp, fnp-bc. 01/27/2022, 3:38 PMPatient ID: Michele LikesNaomi C Vang, female   DOB: August 07, 1995, 27 y.o.   MRN: [id number]

## 2022-01-28 LAB — HEMOGLOBIN A1C
Hgb A1c MFr Bld: 5.2 % (ref 4.8–5.6)
Mean Plasma Glucose: 103 mg/dL

## 2022-01-28 NOTE — Progress Notes (Addendum)
D: Pt denied SI/HI/AVH this morning. Pt rated her depression a 7/10, anxiety a 7/10, and feelings of hopelessness a 7/10. Pt reports that she feels very tired this morning and has slept in for most of the morning, only getting up to switch rooms and take morning medications. Pt presents with a depressed/ tearful affect. Pt has been cooperative throughout the shift. Pt reports that she does not want to continue taking her Propanolol that was started yesterday due to it making her feel drowsy all day.   A: RN provided support and encouragement to patient. Pt given scheduled medications as prescribed. Q15 min checks verified for safety.    R: Patient verbally contracts for safety. Patient compliant with medications and treatment plan. Patient is interacting well on the unit. Pt is safe on the unit.   01/28/22 0908  Psych Admission Type (Psych Patients Only)  Admission Status Voluntary  Psychosocial Assessment  Patient Complaints Anxiety;Depression;Crying spells  Eye Contact Fair  Facial Expression Sad  Affect Anxious;Depressed;Sad  Speech Soft  Interaction Assertive  Motor Activity Slow  Appearance/Hygiene Unremarkable  Behavior Characteristics Anxious  Mood Depressed;Anxious;Sad  Thought Process  Coherency WDL  Content WDL  Delusions None reported or observed  Perception WDL  Hallucination None reported or observed  Judgment Impaired  Confusion None  Danger to Self  Current suicidal ideation? Denies  Agreement Not to Harm Self Yes  Description of Agreement Pt verbally contracts for safety  Danger to Others  Danger to Others None reported or observed

## 2022-01-28 NOTE — Progress Notes (Signed)
   01/28/22 0643  15 Minute Checks  Location Hallway  Visual Appearance Calm  Behavior Composed  Sleep (Behavioral Health Patients Only)  Calculate sleep? (Click Yes once per 24 hr at 0600 safety check) Yes  Documented sleep last 24 hours 7.75

## 2022-01-28 NOTE — Progress Notes (Signed)
Adult Psychoeducational Group Note  Date:  01/28/2022 Time:  9:00 PM  Group Topic/Focus:  Wrap-Up Group:   The focus of this group is to help patients review their daily goal of treatment and discuss progress on daily workbooks.  Participation Level:  Active  Participation Quality:  Appropriate  Affect:  Appropriate  Cognitive:  Appropriate  Insight: Appropriate  Engagement in Group:  Engaged  Modes of Intervention:  Discussion  Additional Comments:   Pt states that she had a good day and was able to socialize and attend groups. Pt endorsed feelings of anxiety and depression and wants to gain better coping skills for her anxiety.   Gerhard Perches 01/28/2022, 9:00 PM

## 2022-01-28 NOTE — Progress Notes (Signed)
   01/28/22 2044  Psych Admission Type (Psych Patients Only)  Admission Status Voluntary  Psychosocial Assessment  Patient Complaints Anxiety;Depression;Insomnia  Eye Contact Fair  Facial Expression Sad  Affect Anxious;Depressed;Sad  Speech Logical/coherent;Soft  Interaction Assertive  Motor Activity Slow  Appearance/Hygiene Unremarkable  Behavior Characteristics Anxious  Mood Anxious;Depressed;Sad  Thought Process  Coherency WDL  Content WDL  Delusions None reported or observed  Perception WDL  Hallucination None reported or observed  Judgment Impaired  Confusion None  Danger to Self  Current suicidal ideation? Denies  Agreement Not to Harm Self Yes  Description of Agreement Patient verbally contracts for safety.  Danger to Others  Danger to Others None reported or observed   Patient alert and oriented. Presenting with an anxious, sad affect and mood. Patient denies SI, HI, AVH, and pain. Patient endorses anxiety and depression 7/10. Scheduled medications administered to patient, per provider orders. Patient reports nausea with past admin of hydroxyzine on 01/01. Patient reports difficulty with falling asleep. PRN trazodone administered, per provider orders. Patient reports sleepiness in the mornings and difficulty attending group meetings. Education provided for current medications. Support and encouragement provided. Routine safety checks conducted every 15 minutes.  Patient verbally  contracts for safety. Patient remains safe on the unit.

## 2022-01-28 NOTE — [facility] Group Notes (Signed)
Pt attended group and participated in discussion. 

## 2022-01-28 NOTE — Progress Notes (Signed)
[facility] MD Progress Note  01/28/2022 3:18 PM  [name]   MRN:  [id number]  Reason for admission: 27 y.o. female with a past psychiatric history of bipolar I disorder depressed severe, anxiety, depression, suicidal ideations, and inpatient psychiatric hospitalizations.  She is admitted to the [facility] after presenting  with a friend  as a voluntary walk-in for assessment for being off her psychotropic medications x 2 months, staying up x 3 days without sleep, increased emotional lability, racing thoughts, in the context of grandmother's death, break-up with boyfriend, and custody battle for her 64 years old daughter.     Daily notes: Patient is seen and examined on [address] laying on her bed.  She appears calm, quiet and participating in the exam.  Chart reviewed and findings shared with the treatment team and discussed with attending psychiatrist.  She presents alert, oriented & aware of situation. She is visible on the unit, attending group sessions and going to the dining room for meals. She presents with improved mood, however affect remains flat. She reports anxiety of 7/10, with 10 being the worst.  Encouraged to request something from the nursing staff to help with anxiety.  She continues to take her scheduled medication without somatic discomfort.  Noted from nurses report patient received PRNs of hydroxyzine for anxiety, Milk of magnesia for mild constipation, and trazodone for sleep within the past 24 hours.  She is encouraged to attend group sessions to help her learn coping skills. Discussed with the treatment team that [name] does not want anyone from [facility] to contact Sharia Reeve but has her consent to talk to [place]. [name] currently denies any SIHI, AVH, delusional thoughts or paranoia. She does not appear to be responding to any internal stimuli. Will continue current treatment plan as already in progress. Reviewed Vital signs, pulse rate is improved at 72. Patient continues on propranolol 10 mg bid as  ordered.  Principal Problem: Bipolar 1 disorder, depressed, severe (HCC)  Diagnosis: Principal Problem:   Bipolar 1 disorder, depressed, severe (HCC) Active Problems:   Anxiety   Depression   Bipolar 1 disorder (HCC)  Total Time spent with patient: 30 minutes  Past Psychiatric History: Previous Psych Diagnoses:  Prior inpatient treatment: Yes Current/prior outpatient treatment: Yes Prior rehab hx: No Psychotherapy hx: Yes History of suicide: Yes, attempted suicide via OD on medication in 2015 History of homicide or aggression: No Psychiatric medication history: Yes, see HPI Psychiatric medication compliance history: Yes Neuromodulation history: No Current Psychiatrist: Psychiatrist at [facility] Current therapist: None  Past Medical History:  Past Medical History:  Diagnosis Date   Allergic conjunctivitis    Allergy    Anxiety 06/04/2011   panic attacks   Astigmatism    glasses   Bipolar 1 disorder (HCC)    Borderline personality disorder (HCC)    Depression    Obesity    Post traumatic stress disorder    Urinary tract infection 1993/09/09   recurrent, nl [facility])    Past Surgical History:  Procedure Laterality Date   INGUINAL HERNIA REPAIR  7/95   bilat exploration, left repair   UMBILICAL HERNIA REPAIR  7/95   repaired as infant   Family History:  Family History  Problem Relation Age of Onset   Hypertension Mother    Hypothyroidism Mother    Hypertension Father    Brain cancer Maternal Grandfather    Cancer Other    Family Psychiatric  History: Psych: Mother diagnosed with bipolar, brother diagnosed with  bipolar, depression, anxiety, PTSD.  Father has history of homicidal ideation and killed patient's stepfather in 2014, currently in prison. Psych Rx: Yes SA/HA: Mom shot herself in the stomach for suicide attempt 22 years ago.  And father had HA as indicated above. Substance use family hx: Yes.  Father and mom alcoholics.  Mother abuses pain pills     Social History:  Social History   Substance and Sexual Activity  Alcohol Use No     Social History   Substance and Sexual Activity  Drug Use Yes   Types: Other-see comments   Comment: pt uses delta 8    Social History   Socioeconomic History   Marital status: Single    Spouse name: Not on file   Number of children: Not on file   Years of education: Not on file   Highest education level: Not on file  Occupational History   Not on file  Tobacco Use   Smoking status: Every Day    Packs/day: 1.00    Types: Cigarettes   Smokeless tobacco: Current   Tobacco comments:    Vapes delta 8  Vaping Use   Vaping Use: Every day  Substance and Sexual Activity   Alcohol use: No   Drug use: Yes    Types: Other-see comments    Comment: pt uses delta 8   Sexual activity: Not Currently    Birth control/protection: Pill, Condom  Other Topics Concern   Not on file  Social History Narrative   Graduated HS   27 years old   Living with grandmother   Moving to [place]         Social Determinants of Health   Financial Resource Strain: Unknown (10/18/2018)   Overall Financial Resource Strain (CARDIA)    Difficulty of Paying Living Expenses: Patient refused  Food Insecurity: No Food Insecurity (01/24/2022)   Hunger Vital Sign    Worried About Running Out of Food in the Last Year: Never true    Ran Out of Food in the Last Year: Never true  Transportation Needs: No Transportation Needs (01/24/2022)   PRAPARE - Administrator, Civil Service (Medical): No    Lack of Transportation (Non-Medical): No  Physical Activity: Unknown (10/18/2018)   Exercise Vital Sign    Days of Exercise per Week: Patient refused    Minutes of Exercise per Session: Patient refused  Stress: Not on file  Social Connections: Unknown (10/18/2018)   Social Connection and Isolation Panel [NHANES]    Frequency of Communication with Friends and Family: Patient refused    Frequency of Social  Gatherings with Friends and Family: Patient refused    Attends Religious Services: Patient refused    Database administrator or Organizations: Patient refused    Attends Banker Meetings: Patient refused    Marital Status: Patient refused   Additional Social History:   Sleep: Good  Appetite:  Good  Current Medications: Current Facility-Administered Medications  Medication Dose Route Frequency Provider Last Rate Last Admin   acetaminophen (TYLENOL) tablet 650 mg  650 mg Oral Q6H PRN Leevy-Johnson, Brooke A, NP   650 mg at 01/25/22 1608   alum & mag hydroxide-simeth (MAALOX/MYLANTA) 200-200-20 MG/5ML suspension 30 mL  30 mL Oral Q4H PRN Leevy-Johnson, Brooke A, NP       gabapentin (NEURONTIN) capsule 100 mg  100 mg Oral TID Phineas Inches, MD   100 mg at 01/28/22 1145   hydrOXYzine (ATARAX) tablet 50 mg  50 mg Oral TID PRN Laretta Bolster, FNP   50 mg at 01/27/22 2125   influenza vac split quadrivalent PF (FLUARIX) injection 0.5 mL  0.5 mL Intramuscular Tomorrow-1000 Nkwenti, Doris, NP       lamoTRIgine (LAMICTAL) tablet 100 mg  100 mg Oral BID Laretta Bolster, FNP   100 mg at 01/28/22 2025   Lurasidone HCl TABS 60 mg  60 mg Oral Q breakfast Massengill, Ovid Curd, MD   60 mg at 01/28/22 0834   magnesium hydroxide (MILK OF MAGNESIA) suspension 30 mL  30 mL Oral Daily PRN Leevy-Johnson, Brooke A, NP   30 mL at 01/27/22 1819   mirtazapine (REMERON SOL-TAB) disintegrating tablet 45 mg  45 mg Oral QHS Ivonna Kinnick, Kris Hartmann, FNP   45 mg at 01/27/22 2125   nicotine (NICODERM CQ - dosed in mg/24 hours) patch 21 mg  21 mg Transdermal Daily Nicholes Rough, NP   21 mg at 01/28/22 0836   ondansetron (ZOFRAN-ODT) disintegrating tablet 4 mg  4 mg Oral Q8H PRN Frenchie Dangerfield, Kris Hartmann, FNP       pneumococcal 20-valent conjugate vaccine (PREVNAR 20) injection 0.5 mL  0.5 mL Intramuscular Tomorrow-1000 Massengill, Ovid Curd, MD       propranolol (INDERAL) tablet 10 mg  10 mg Oral Q12H Massengill, Ovid Curd, MD   10 mg at  01/28/22 0834   risperiDONE (RISPERDAL M-TABS) disintegrating tablet 2 mg  2 mg Oral Q8H PRN Leevy-Johnson, Brooke A, NP   2 mg at 01/25/22 1606   And   ziprasidone (GEODON) injection 20 mg  20 mg Intramuscular PRN Leevy-Johnson, Brooke A, NP       topiramate (TOPAMAX) tablet 100 mg  100 mg Oral Daily Shaguana Love, Kris Hartmann, FNP   100 mg at 01/28/22 0834   traZODone (DESYREL) tablet 100 mg  100 mg Oral QHS PRN Laretta Bolster, FNP   100 mg at 01/27/22 2124   Lab Results:  Results for orders placed or performed during the hospital encounter of 01/24/22 (from the past 48 hour(s))  Comprehensive metabolic panel     Status: Abnormal   Collection Time: 01/26/22  6:29 PM  Result Value Ref Range   Sodium 136 135 - 145 mmol/L   Potassium 3.4 (L) 3.5 - 5.1 mmol/L   Chloride 108 98 - 111 mmol/L   CO2 19 (L) 22 - 32 mmol/L   Glucose, Bld 108 (H) 70 - 99 mg/dL    Comment: Glucose reference range applies only to samples taken after fasting for at least 8 hours.   BUN 16 6 - 20 mg/dL   Creatinine, Ser 0.73 0.44 - 1.00 mg/dL   Calcium 9.1 8.9 - 10.3 mg/dL   Total Protein 6.9 6.5 - 8.1 g/dL   Albumin 4.3 3.5 - 5.0 g/dL   AST 15 15 - 41 U/L   ALT 12 0 - 44 U/L   Alkaline Phosphatase 54 38 - 126 U/L   Total Bilirubin 0.4 0.3 - 1.2 mg/dL   GFR, Estimated >60 >60 mL/min    Comment: (NOTE) Calculated using the CKD-EPI Creatinine Equation (2021)    Anion gap 9 5 - 15    Comment: Performed at [facility], [place] [address] [place], [place] [address]  TSH     Status: None   Collection Time: 01/26/22  6:29 PM  Result Value Ref Range   TSH 1.531 0.350 - 4.500 uIU/mL    Comment: Performed by a 3rd Generation assay with a functional  sensitivity of <=0.01 uIU/mL. Performed at [facility], [place] [address], [place], [place] [address]   Hemoglobin A1c     Status: None   Collection Time: 01/26/22  6:29 PM  Result Value Ref Range   Hgb A1c MFr Bld 5.2 4.8 - 5.6 %    Comment:  (NOTE)         Prediabetes: 5.7 - 6.4         Diabetes: >6.4         Glycemic control for adults with diabetes: <7.0    Mean Plasma Glucose 103 mg/dL    Comment: (NOTE) Performed At: [place] [facility] [place], [place] [id number] Rush Farmer MD BD:[id number]   CBC with Differential/Platelet     Status: None   Collection Time: 01/26/22  6:29 PM  Result Value Ref Range   WBC 7.6 4.0 - 10.5 K/uL   RBC 4.27 3.87 - 5.11 MIL/uL   Hemoglobin 13.3 12.0 - 15.0 g/dL   HCT 40.8 36.0 - 46.0 %   MCV 95.6 80.0 - 100.0 fL   MCH 31.1 26.0 - 34.0 pg   MCHC 32.6 30.0 - 36.0 g/dL   RDW 13.2 11.5 - 15.5 %   Platelets 258 150 - 400 K/uL   nRBC 0.0 0.0 - 0.2 %   Neutrophils Relative % 53 %   Neutro Abs 4.1 1.7 - 7.7 K/uL   Lymphocytes Relative 34 %   Lymphs Abs 2.6 0.7 - 4.0 K/uL   Monocytes Relative 9 %   Monocytes Absolute 0.7 0.1 - 1.0 K/uL   Eosinophils Relative 3 %   Eosinophils Absolute 0.2 0.0 - 0.5 K/uL   Basophils Relative 1 %   Basophils Absolute 0.1 0.0 - 0.1 K/uL   Immature Granulocytes 0 %   Abs Immature Granulocytes 0.02 0.00 - 0.07 K/uL    Comment: Performed at [facility], [place] [address], [place], [place] [address]    Blood Alcohol level:  Lab Results  Component Value Date   [facility] <11 05/08/2011   ETH <11 22/29/7989    Metabolic Disorder Labs: Lab Results  Component Value Date   HGBA1C 5.2 01/26/2022   MPG 103 01/26/2022   MPG 97 10/19/2018   No results found for: "PROLACTIN" Lab Results  Component Value Date   CHOL 191 10/19/2018   TRIG 82 10/19/2018   HDL 56 10/19/2018   CHOLHDL 3.4 10/19/2018   VLDL 16 10/19/2018   LDLCALC 119 (H) 10/19/2018   LDLCALC 96 11/07/2016    Physical Findings: AIMS: Facial and Oral Movements Muscles of Facial Expression: None, normal Lips and Perioral Area: None, normal Jaw: None, normal Tongue: None, normal,Extremity Movements Upper (arms, wrists, hands, fingers): None, normal Lower  (legs, knees, ankles, toes): None, normal, Trunk Movements Neck, shoulders, hips: None, normal, Overall Severity Severity of abnormal movements (highest score from questions above): None, normal Incapacitation due to abnormal movements: None, normal Patient's awareness of abnormal movements (rate only patient's report): No Awareness, Dental Status Current problems with teeth and/or dentures?: No Does patient usually wear dentures?: No  CIWA:    COWS:     Musculoskeletal: Strength & Muscle Tone: within normal limits Gait & Station: normal Patient leans: N/A  Psychiatric Specialty Exam:  Presentation  General Appearance:  Appropriate for Environment; Casual; Fairly Groomed  Eye Contact: Good  Speech: Clear and Coherent; Normal Rate  Speech Volume: Normal  Handedness: Right  Mood and Affect  Mood: Anxious; Depressed  Affect:  Appropriate; Congruent  Thought Process  Thought Processes: Coherent  Descriptions of Associations:Intact  Orientation:Full (Time, Place and Person)  Thought Content:Logical  History of Schizophrenia/Schizoaffective disorder:No data recorded Duration of Psychotic Symptoms:No data recorded Hallucinations:Hallucinations: None  Ideas of Reference:None  Suicidal Thoughts:Suicidal Thoughts: No  Homicidal Thoughts:Homicidal Thoughts: No  Sensorium  Memory: Immediate Fair; Remote Fair; Recent Fair  Judgment: Fair  Insight: Fair  Art therapist  Concentration: Good  Attention Span: Good  Recall: Fair  Fund of Knowledge: Fair  Language: Good  Psychomotor Activity  Psychomotor Activity: Psychomotor Activity: Normal  Assets  Assets: Communication Skills; Desire for Improvement; Physical Health  Sleep  Sleep: Sleep: Good Number of Hours of Sleep: 6  Physical Exam: Physical Exam Vitals and nursing note reviewed.  HENT:     Head: Normocephalic.     Nose: Nose normal.     Mouth/Throat:     Pharynx:  Oropharynx is clear.  Eyes:     Pupils: Pupils are equal, round, and reactive to light.  Cardiovascular:     Rate and Rhythm: Normal rate.     Comments: Elevated pulse rate: 117. Patient is currently is no apparent distress. Pulmonary:     Effort: Pulmonary effort is normal.  Abdominal:     Palpations: Abdomen is soft.  Genitourinary:    Comments: Deferred Musculoskeletal:        General: Normal range of motion.     Cervical back: Normal range of motion.  Skin:    General: Skin is warm.  Neurological:     General: No focal deficit present.     Mental Status: She is oriented to person, place, and time.  Psychiatric:        Behavior: Behavior normal.    Review of Systems  Constitutional: Negative.   HENT: Negative.    Eyes: Negative.   Respiratory: Negative.    Cardiovascular: Negative.   Gastrointestinal: Negative.   Genitourinary: Negative.   Musculoskeletal: Negative.   Skin: Negative.   Neurological: Negative.   Endo/Heme/Allergies: Negative.   Psychiatric/Behavioral:  Positive for depression and substance abuse. The patient is nervous/anxious and has insomnia.    Blood pressure 103/73, pulse 72, temperature (!) 97.5 F (36.4 C), temperature source Oral, resp. rate 15, height 5\' 6"  (1.676 m), weight 75.8 kg, SpO2 100 %. Body mass index is 26.95 kg/m.  Treatment Plan Summary: Daily contact with patient to assess and evaluate symptoms and progress in treatment and Medication management.   Continue inpatient hospitalization.  Will continue today 01/28/2022 plan as below except where it is noted.    Diagnosis/ principal Problem:   Bipolar 1 disorder, depressed, severe (HCC)   Plan -Increased Latuda from 40 mg to 60 mg po daily for mood stabilization. -Continue lamotrigine 100 mg po bid daily for mood stabilization. -Continue Topamax 100 mg po daily for moo stabilization. -Continue Trazodone 100 mg po Q hs prn for insomnia.   Depression: -Continue  mirtazapine-ODT 45 mg po Q hs.   Anxiety: -Continue hydroxyzine 50 mg po tid prn.  -Continue gabapentin 100 mg po tid.  -Continue Propranolol 10 mg po bid for anxiety/elevated heart rate.   Insomnia: Continue trazodone 100 mg po Q hs.   Agitation protocols: Continue as recommended below. Risperidone disintegrating tab 2 mg p.o. every 8 hours as needed agitation and Lorazepam tablet 1 mg p.o. as needed anxiety, severe agitation for 1 dose only and Ziprasidone Geodon injection 20 mg IM as needed agitation for 1 dose only  Other PRN Medications -Continue Acetaminophen 650 mg po Q 6 hrs prn for fever/pain -Continue Maalox 30 mL po Q 4 hrs prn digestion -Continue Magnesium hydroxide 30 mL po daily for constipation -Continue Zofran-odt 4 mg p.o. every 8 hours as needed for nausea or vomiting  Discharge Planning: Social work and case management to assist with discharge planning and identification of hospital follow-up needs prior to discharge Estimated LOS: 5-7 days Discharge Concerns: Need to establish a safety plan; Medication compliance and effectiveness Discharge Goals: Return home with outpatient referrals for mental health follow-up including medication management/psychotherapy.   I certify that inpatient services furnished can reasonably be expected to improve the patient's condition.     Cecilie Lowersina C Jayana Kotula, FNP, pmhnp, fnp-bc. 01/28/2022, 3:18 PMPatient ID: Reuben LikesNaomi C Olejnik, female   DOB: 1995/05/15, 27 y.o.   MRN: [id number] Patient ID: Reuben Likesaomi C Higginbotham, female   DOB: 1995/05/15, 27 y.o.   MRN: [id number]

## 2022-01-28 NOTE — Group Note (Signed)
LCSW Group Therapy Note   Group Date: 01/28/2022 Start Time: 1100 End Time: 1200   Type of Therapy and Topic:  Group Therapy: Boundaries  Participation Level:  Did Not Attend  Description of Group: This group will address the use of boundaries in their personal lives. Patients will explore why boundaries are important, the difference between healthy and unhealthy boundaries, and negative and postive outcomes of different boundaries and will look at how boundaries can be crossed.  Patients will be encouraged to identify current boundaries in their own lives and identify what kind of boundary is being set. Facilitators will guide patients in utilizing problem-solving interventions to address and correct types boundaries being used and to address when no boundary is being used. Understanding and applying boundaries will be explored and addressed for obtaining and maintaining a balanced life. Patients will be encouraged to explore ways to assertively make their boundaries and needs known to significant others in their lives, using other group members and facilitator for role play, support, and feedback.  Therapeutic Goals:  1.  Patient will identify areas in their life where setting clear boundaries could be  used to improve their life.  2.  Patient will identify signs/triggers that a boundary is not being respected. 3.  Patient will identify two ways to set boundaries in order to achieve balance in  their lives: 4.  Patient will demonstrate ability to communicate their needs and set boundaries  through discussion and/or role plays   Therapeutic Modalities:   Cognitive Behavioral Therapy Solution-Focused Therapy  Windle Guard, LCSW 01/28/2022  3:17 PM

## 2022-01-28 NOTE — Plan of Care (Signed)
  Problem: Education: Goal: Knowledge of [facility] Education information/materials will improve Outcome: Progressing   Problem: Coping: Goal: Ability to verbalize frustrations and anger appropriately will improve Outcome: Progressing Goal: Ability to demonstrate self-control will improve Outcome: Progressing   Problem: Safety: Goal: Periods of time without injury will increase Outcome: Progressing   Problem: Coping: Goal: Coping ability will improve Outcome: Progressing Goal: Will verbalize feelings Outcome: Progressing   Problem: Safety: Goal: Ability to disclose and discuss suicidal ideas will improve Outcome: Progressing   Problem: Self-Concept: Goal: Will verbalize positive feelings about self Outcome: Progressing Goal: Level of anxiety will decrease Outcome: Progressing   Problem: Safety: Goal: Ability to remain free from injury will improve Outcome: Progressing

## 2022-01-29 DIAGNOSIS — F319 Bipolar disorder, unspecified: Secondary | ICD-10-CM

## 2022-01-29 MED ORDER — PROPRANOLOL HCL 10 MG PO TABS
5.0000 mg | ORAL_TABLET | Freq: Two times a day (BID) | ORAL | Status: DC
  Administered 2022-01-29 – 2022-01-31 (×3): 5 mg via ORAL
  Filled 2022-01-29: qty 0.5
  Filled 2022-01-29: qty 1
  Filled 2022-01-29 (×6): qty 0.5
  Filled 2022-01-29: qty 1
  Filled 2022-01-29: qty 0.5

## 2022-01-29 MED ORDER — GABAPENTIN 100 MG PO CAPS
200.0000 mg | ORAL_CAPSULE | Freq: Three times a day (TID) | ORAL | Status: DC
  Administered 2022-01-29 – 2022-01-30 (×4): 200 mg via ORAL
  Filled 2022-01-29 (×9): qty 2

## 2022-01-29 MED ORDER — TOPIRAMATE 25 MG PO TABS
50.0000 mg | ORAL_TABLET | Freq: Every day | ORAL | Status: DC
  Administered 2022-01-30 – 2022-01-31 (×2): 50 mg via ORAL
  Filled 2022-01-29 (×4): qty 2

## 2022-01-29 MED ORDER — MIRTAZAPINE 15 MG PO TBDP
30.0000 mg | ORAL_TABLET | Freq: Every day | ORAL | Status: DC
  Administered 2022-01-29 – 2022-01-30 (×2): 30 mg via ORAL
  Filled 2022-01-29 (×4): qty 2

## 2022-01-29 MED ORDER — LURASIDONE HCL 80 MG PO TABS
80.0000 mg | ORAL_TABLET | Freq: Every day | ORAL | Status: DC
  Administered 2022-01-30 – 2022-01-31 (×2): 80 mg via ORAL
  Filled 2022-01-29 (×4): qty 1

## 2022-01-29 NOTE — Progress Notes (Signed)
The patient attended the N.A.meeting and was appropriate.  ?

## 2022-01-29 NOTE — Plan of Care (Signed)
Nurse discussed anxiety, depression and coping skills with patient.  

## 2022-01-29 NOTE — Progress Notes (Signed)
[facility] MD Progress Note  01/29/2022 11:58 AM  [name]   MRN:  [id number]  Reason for admission: 27 y.o. female with a past psychiatric history of bipolar I disorder depressed severe, anxiety, depression, suicidal ideations, and inpatient psychiatric hospitalizations.  She is admitted to the [facility] after presenting  with a friend  as a voluntary walk-in for assessment for being off her psychotropic medications x 2 months, staying up x 3 days without sleep, increased emotional lability, racing thoughts, in the context of grandmother's death, break-up with boyfriend, and custody battle for her 80 years old daughter.     Daily notes: On assessment today, patient is seen and examined on [address]. Chart reviewed and findings shared with the treatment team and discussed with attending psychiatrist.  She presents alert, oriented & aware of situation.  Patient states, "this is the best I have been since admission with my anxiety reduced."  Rates anxiety as 5/10, with 10 being the worst.  Reports mood is less depressed.  Rates depression as 5/10, with 10 being the worst.  Affect is much brighter, with patient wearing smiles.  Appearance is clean and well-groomed.  Patient is visible on the unit, attending group sessions and interacting with other patients.  To further enhance improvement in her mood, Latuda was increased from 60 mg to 80 mg p.o. daily, Topamax was decreased from 100 mg to 50 mg p.o daily. Gabapentin was increased from 100 mg 3 times daily to 200 mg 3 times daily to reduce anxiety.  Propranolol was decreased from 10 mg p.o. daily to 5 mg p.o. daily due to improvement of her vital signs.  She continues to take her scheduled medication without somatic discomfort.  Noted from nurses report patient received PRNs of hydroxyzine for anxiety, Milk of magnesia for mild constipation, and trazodone for sleep within the past 24 hours.  She is encouraged to continue to attend group sessions to help her learn coping  skills. Molley currently denies any SIHI, AVH, delusional thoughts or paranoia. She does not appear to be responding to any internal stimuli. Will continue current treatment plan as already in progress. Reviewed Vital signs, pulse rate is improved at 88 . Patient continues on propranolol 5 mg bid as ordered.  Principal Problem: Bipolar 1 disorder, depressed, severe (HCC)  Diagnosis: Principal Problem:   Bipolar 1 disorder, depressed, severe (HCC) Active Problems:   Anxiety   Depression   Bipolar 1 disorder (HCC)  Total Time spent with patient: 30 minutes  Past Psychiatric History: Previous Psych Diagnoses:  Prior inpatient treatment: Yes Current/prior outpatient treatment: Yes Prior rehab hx: No Psychotherapy hx: Yes History of suicide: Yes, attempted suicide via OD on medication in 2015 History of homicide or aggression: No Psychiatric medication history: Yes, see HPI Psychiatric medication compliance history: Yes Neuromodulation history: No Current Psychiatrist: Psychiatrist at [facility] Current therapist: None  Past Medical History:  Past Medical History:  Diagnosis Date   Allergic conjunctivitis    Allergy    Anxiety 06/04/2011   panic attacks   Astigmatism    glasses   Bipolar 1 disorder (HCC)    Borderline personality disorder (HCC)    Depression    Obesity    Post traumatic stress disorder    Urinary tract infection 04-12-93   recurrent, nl [facility])    Past Surgical History:  Procedure Laterality Date   INGUINAL HERNIA REPAIR  7/95   bilat exploration, left repair   UMBILICAL HERNIA REPAIR  7/95  repaired as infant   Family History:  Family History  Problem Relation Age of Onset   Hypertension Mother    Hypothyroidism Mother    Hypertension Father    Brain cancer Maternal Grandfather    Cancer Other    Family Psychiatric  History: Psych: Mother diagnosed with bipolar, brother diagnosed with bipolar, depression, anxiety, PTSD.  Father has history of  homicidal ideation and killed patient's stepfather in 2014, currently in prison. Psych Rx: Yes SA/HA: Mom shot herself in the stomach for suicide attempt 22 years ago.  And father had HA as indicated above. Substance use family hx: Yes.  Father and mom alcoholics.  Mother abuses pain pills    Social History:  Social History   Substance and Sexual Activity  Alcohol Use No     Social History   Substance and Sexual Activity  Drug Use Yes   Types: Other-see comments   Comment: pt uses delta 8    Social History   Socioeconomic History   Marital status: Single    Spouse name: Not on file   Number of children: Not on file   Years of education: Not on file   Highest education level: Not on file  Occupational History   Not on file  Tobacco Use   Smoking status: Every Day    Packs/day: 1.00    Types: Cigarettes   Smokeless tobacco: Current   Tobacco comments:    Vapes delta 8  Vaping Use   Vaping Use: Every day  Substance and Sexual Activity   Alcohol use: No   Drug use: Yes    Types: Other-see comments    Comment: pt uses delta 8   Sexual activity: Not Currently    Birth control/protection: Pill, Condom  Other Topics Concern   Not on file  Social History Narrative   Graduated HS   27 years old   Living with grandmother   Moving to [place] Determinants of Health   Financial Resource Strain: Unknown (10/18/2018)   Overall Financial Resource Strain (CARDIA)    Difficulty of Paying Living Expenses: Patient refused  Food Insecurity: No Food Insecurity (01/24/2022)   Hunger Vital Sign    Worried About Running Out of Food in the Last Year: Never true    Ran Out of Food in the Last Year: Never true  Transportation Needs: No Transportation Needs (01/24/2022)   PRAPARE - Hydrologist (Medical): No    Lack of Transportation (Non-Medical): No  Physical Activity: Unknown (10/18/2018)   Exercise Vital Sign    Days of Exercise  per Week: Patient refused    Minutes of Exercise per Session: Patient refused  Stress: Not on file  Social Connections: Unknown (10/18/2018)   Social Connection and Isolation Panel [NHANES]    Frequency of Communication with Friends and Family: Patient refused    Frequency of Social Gatherings with Friends and Family: Patient refused    Attends Religious Services: Patient refused    Marine scientist or Organizations: Patient refused    Attends Archivist Meetings: Patient refused    Marital Status: Patient refused   Additional Social History:   Sleep: Good  Appetite:  Good  Current Medications: Current Facility-Administered Medications  Medication Dose Route Frequency Provider Last Rate Last Admin   acetaminophen (TYLENOL) tablet 650 mg  650 mg Oral Q6H PRN Leevy-Johnson, Brooke A, NP   650 mg at 01/25/22  1608   alum & mag hydroxide-simeth (MAALOX/MYLANTA) 200-200-20 MG/5ML suspension 30 mL  30 mL Oral Q4H PRN Leevy-Johnson, Brooke A, NP       gabapentin (NEURONTIN) capsule 200 mg  200 mg Oral TID Massengill, Harrold Donath, MD       hydrOXYzine (ATARAX) tablet 50 mg  50 mg Oral TID PRN Cecilie Lowers, FNP   50 mg at 01/27/22 2125   influenza vac split quadrivalent PF (FLUARIX) injection 0.5 mL  0.5 mL Intramuscular Tomorrow-1000 Nkwenti, Doris, NP       lamoTRIgine (LAMICTAL) tablet 100 mg  100 mg Oral BID Cecilie Lowers, FNP   100 mg at 01/29/22 0818   [START ON 01/30/2022] lurasidone (LATUDA) tablet 80 mg  80 mg Oral Q breakfast Massengill, Nathan, MD       magnesium hydroxide (MILK OF MAGNESIA) suspension 30 mL  30 mL Oral Daily PRN Leevy-Johnson, Brooke A, NP   30 mL at 01/27/22 1819   mirtazapine (REMERON SOL-TAB) disintegrating tablet 30 mg  30 mg Oral QHS Ellanor Feuerstein C, FNP       nicotine (NICODERM CQ - dosed in mg/24 hours) patch 21 mg  21 mg Transdermal Daily Starleen Blue, NP   21 mg at 01/29/22 0819   ondansetron (ZOFRAN-ODT) disintegrating tablet 4 mg  4 mg Oral Q8H  PRN Maikel Neisler, Jesusita Oka, FNP       pneumococcal 20-valent conjugate vaccine (PREVNAR 20) injection 0.5 mL  0.5 mL Intramuscular Tomorrow-1000 Massengill, Nathan, MD       propranolol (INDERAL) tablet 5 mg  5 mg Oral Q12H Massengill, Nathan, MD       risperiDONE (RISPERDAL M-TABS) disintegrating tablet 2 mg  2 mg Oral Q8H PRN Leevy-Johnson, Brooke A, NP   2 mg at 01/25/22 1606   And   ziprasidone (GEODON) injection 20 mg  20 mg Intramuscular PRN Leevy-Johnson, Brooke A, NP       [START ON 01/30/2022] topiramate (TOPAMAX) tablet 50 mg  50 mg Oral Daily Massengill, Nathan, MD       traZODone (DESYREL) tablet 100 mg  100 mg Oral QHS PRN Cecilie Lowers, FNP   100 mg at 01/28/22 2139   Lab Results:  No results found for this or any previous visit (from the past 48 hour(s)).   Blood Alcohol level:  Lab Results  Component Value Date   [facility] <11 05/08/2011   ETH <11 08/31/2010    Metabolic Disorder Labs: Lab Results  Component Value Date   HGBA1C 5.2 01/26/2022   MPG 103 01/26/2022   MPG 97 10/19/2018   No results found for: "PROLACTIN" Lab Results  Component Value Date   CHOL 191 10/19/2018   TRIG 82 10/19/2018   HDL 56 10/19/2018   CHOLHDL 3.4 10/19/2018   VLDL 16 10/19/2018   LDLCALC 119 (H) 10/19/2018   LDLCALC 96 11/07/2016    Physical Findings: AIMS: Facial and Oral Movements Muscles of Facial Expression: None, normal Lips and Perioral Area: None, normal Jaw: None, normal Tongue: None, normal,Extremity Movements Upper (arms, wrists, hands, fingers): None, normal Lower (legs, knees, ankles, toes): None, normal, Trunk Movements Neck, shoulders, hips: None, normal, Overall Severity Severity of abnormal movements (highest score from questions above): None, normal Incapacitation due to abnormal movements: None, normal Patient's awareness of abnormal movements (rate only patient's report): No Awareness, Dental Status Current problems with teeth and/or dentures?: No Does patient  usually wear dentures?: No  CIWA:    COWS:  Musculoskeletal: Strength & Muscle Tone: within normal limits Gait & Station: normal Patient leans: N/A  Psychiatric Specialty Exam:  Presentation  General Appearance:  Appropriate for Environment; Casual; Fairly Groomed  Eye Contact: Good  Speech: Clear and Coherent; Normal Rate  Speech Volume: Normal  Handedness: Right  Mood and Affect  Mood: Anxious; Depressed  Affect: Appropriate; Congruent  Thought Process  Thought Processes: Coherent; Goal Directed  Descriptions of Associations:Intact  Orientation:Full (Time, Place and Person)  Thought Content:Logical  History of Schizophrenia/Schizoaffective disorder:No data recorded Duration of Psychotic Symptoms:No data recorded Hallucinations:Hallucinations: None  Ideas of Reference:None  Suicidal Thoughts:Suicidal Thoughts: No  Homicidal Thoughts:Homicidal Thoughts: No  Sensorium  Memory: Immediate Fair; Recent Fair; Remote Fair  Judgment: Fair  Insight: Fair  Art therapist  Concentration: Good  Attention Span: Good  Recall: Fair  Fund of Knowledge: Fair  Language: Good  Psychomotor Activity  Psychomotor Activity: Psychomotor Activity: Normal  Assets  Assets: Communication Skills; Desire for Improvement; Physical Health; Resilience; Social Support  Sleep  Sleep: Sleep: Good Number of Hours of Sleep: 5  Physical Exam: Physical Exam Vitals and nursing note reviewed.  HENT:     Head: Normocephalic.     Nose: Nose normal.     Mouth/Throat:     Pharynx: Oropharynx is clear.  Eyes:     Pupils: Pupils are equal, round, and reactive to light.  Cardiovascular:     Rate and Rhythm: Normal rate.     Comments: Elevated pulse rate: 117. Patient is currently is no apparent distress. Pulmonary:     Effort: Pulmonary effort is normal.  Abdominal:     Palpations: Abdomen is soft.  Genitourinary:    Comments:  Deferred Musculoskeletal:        General: Normal range of motion.     Cervical back: Normal range of motion.  Skin:    General: Skin is warm.  Neurological:     General: No focal deficit present.     Mental Status: She is oriented to person, place, and time.  Psychiatric:        Behavior: Behavior normal.    Review of Systems  Constitutional: Negative.   HENT: Negative.    Eyes: Negative.   Respiratory: Negative.    Cardiovascular: Negative.   Gastrointestinal: Negative.   Genitourinary: Negative.   Musculoskeletal: Negative.   Skin: Negative.   Neurological: Negative.   Endo/Heme/Allergies: Negative.   Psychiatric/Behavioral:  Positive for depression and substance abuse. The patient is nervous/anxious and has insomnia.    Blood pressure 109/71, pulse 88, temperature 98.5 F (36.9 C), temperature source Oral, resp. rate 16, height 5\' 6"  (1.676 m), weight 75.8 kg, SpO2 100 %. Body mass index is 26.95 kg/m.  Treatment Plan Summary: Daily contact with patient to assess and evaluate symptoms and progress in treatment and Medication management.   Continue inpatient hospitalization.  Will continue today 01/29/2022 plan as below except where it is noted.    Diagnosis/ principal Problem:   Bipolar 1 disorder, depressed, severe (HCC)   Plan -Increased Latuda from 60 mg po daily to 80 mg p.o. daily for mood stabilization 12/30/2022 -Continue lamotrigine 100 mg po bid daily for mood stabilization. -Decrease Topamax from 100 mg po 50 mg p.o. daily for mood stabilization 12/30/2022 -Continue Trazodone 100 mg po Q hs prn for insomnia.   Depression: -Decrease mirtazapine-ODT 45 mg to 30 mg po Q hs 12/30/2022   Anxiety: -Continue hydroxyzine 50 mg po tid prn.  -Increase gabapentin 100 mg po  tid to 200 mg p.o. 3 times daily 12/30/2022 -Decrease propranolol from 10 mg po bid to 5 mg p.o. twice daily for anxiety/elevated heart rate 12/30/2022   Insomnia: Continue trazodone 100 mg po Q  hs.   Agitation protocols: Continue as recommended below. Risperidone disintegrating tab 2 mg p.o. every 8 hours as needed agitation and Lorazepam tablet 1 mg p.o. as needed anxiety, severe agitation for 1 dose only and Ziprasidone Geodon injection 20 mg IM as needed agitation for 1 dose only            Other PRN Medications -Continue Acetaminophen 650 mg po Q 6 hrs prn for fever/pain -Continue Maalox 30 mL po Q 4 hrs prn digestion -Continue Magnesium hydroxide 30 mL po daily for constipation -Continue Zofran-odt 4 mg p.o. every 8 hours as needed for nausea or vomiting  Discharge Planning: Social work and case management to assist with discharge planning and identification of hospital follow-up needs prior to discharge Estimated LOS: 5-7 days Discharge Concerns: Need to establish a safety plan; Medication compliance and effectiveness Discharge Goals: Return home with outpatient referrals for mental health follow-up including medication management/psychotherapy.   I certify that inpatient services furnished can reasonably be expected to improve the patient's condition.     Laretta Bolster, FNP, pmhnp, fnp-bc. 01/29/2022, 11:58 AMPatient ID: [name], female   DOB: 1995/03/18, 27 y.o.   MRN: [id number] Patient ID: YZABELLA CRUNK, female   DOB: September 27, 1995, 27 y.o.   MRN: [id number] Patient ID: KALANI BARAY, female   DOB: [birth year], 27 y.o.   MRN: [id number]

## 2022-01-29 NOTE — Progress Notes (Signed)
   01/29/22 2200  Psych Admission Type (Psych Patients Only)  Admission Status Voluntary  Psychosocial Assessment  Patient Complaints Anxiety;Insomnia  Eye Contact Fair  Facial Expression Anxious;Sad  Affect Anxious;Depressed  Speech Logical/coherent  Interaction Assertive  Motor Activity Slow  Appearance/Hygiene Unremarkable  Behavior Characteristics Anxious  Mood Anxious  Thought Process  Coherency WDL  Content WDL  Delusions None reported or observed  Perception WDL  Hallucination None reported or observed  Judgment Impaired  Confusion None  Danger to Self  Current suicidal ideation? Denies  Agreement Not to Harm Self Yes  Description of Agreement Verbal  Danger to Others  Danger to Others None reported or observed

## 2022-01-29 NOTE — Progress Notes (Signed)
D:  Patient's self inventory  sheet, patient has fair sleep, sleep medication helpful.  Poor appetite, low energy level, poor concentration.  Rated depression and hopeless #5.  Denied withdrawals.  Denied SI.  Denied physical problems.  Denied physical pain.  Goal is discharge, worried about discharge date. A:  Medications administered per MD orders.  Emotional support and encouragement given patient. R:  Denied SI and HI, contracts for safety.  Denied A/V hallucinations.  Safety maintained with 15 minute checks.

## 2022-01-29 NOTE — Group Note (Signed)
Recreation Therapy Group Note   Group Topic:Other  Group Date: 01/29/2022 Start Time: 1400 End Time: 1440 Facilitators: Ryheem Jay-McCall, LRT,CTRS Location: [address] Dayroom   Goal Area(s) Addresses:  Patient will engage in pro-social way in music group.  Patient will follow directions of drum leader on the first prompt. Patient will demonstrate no behavioral issues during group.  Patient will identify if a reduction in stress level occurs as a result of participation in therapeutic drum circle.    Activity Description/Intervention: Therapeutic Drumming. Patients with peers and staff were given the opportunity to engage in a leader facilitated [place] with staff from the [employer], in partnership with [employer]. Nurse, adult and trained [employer], Devin Going leading with LRT observing and documenting intervention and pt response. This evidenced-based practice targets 7 areas of health and wellbeing in the human experience including: stress-reduction, exercise, self-expression, camaraderie/support, nurturing, spirituality, and music-making (leisure).       Affect/Mood: Appropriate   Participation Level: Engaged   Participation Quality: Independent   Behavior: Appropriate   Speech/Thought Process: Focused    Clinical Observations/Individualized Feedback: Patient actively engaged in therapeutic drumming exercise and discussions. Pt was appropriate with peers, staff, and musical equipment for duration of programming.    Plan: Continue to engage patient in RT group sessions 2-3x/week.   Assata Juncaj-McCall, LRT,CTRS 01/29/2022 3:20 PM

## 2022-01-30 MED ORDER — GABAPENTIN 300 MG PO CAPS
300.0000 mg | ORAL_CAPSULE | Freq: Three times a day (TID) | ORAL | Status: DC
  Administered 2022-01-30 – 2022-01-31 (×4): 300 mg via ORAL
  Filled 2022-01-30 (×8): qty 1

## 2022-01-30 NOTE — Progress Notes (Signed)
The patient rated her day as a 9 out of 10 since she anticipates being discharged tomorrow. Her positive event for the day is that she has found a place to stay and plans to begin driving next week.

## 2022-01-30 NOTE — Plan of Care (Signed)
  Problem: Coping: Goal: Ability to verbalize frustrations and anger appropriately will improve Outcome: Progressing   Problem: Health Behavior/Discharge Planning: Goal: Identification of resources available to assist in meeting health care needs will improve Outcome: Progressing Goal: Compliance with treatment plan for underlying cause of condition will improve Outcome: Progressing   

## 2022-01-30 NOTE — Progress Notes (Signed)
[facility] MD Progress Note  01/30/2022 10:23 AM  [name]   MRN:  [id number]  Reason for admission: 27 y.o. female with a past psychiatric history of bipolar I disorder depressed severe, anxiety, depression, suicidal ideations, and inpatient psychiatric hospitalizations.  She is admitted to the [facility] after presenting  with a friend  as a voluntary walk-in for assessment for being off her psychotropic medications x 2 months, staying up x 3 days without sleep, increased emotional lability, racing thoughts, in the context of grandmother's death, break-up with boyfriend, and custody battle for her 43 years old daughter.     Daily notes: [name] is seen and examined on [address] lying on her bed. Chart reviewed and findings shared with the treatment team and discussed with attending psychiatrist.  Patient reports being tired this a.m.  She presents alert, oriented and aware of situation.  Patient states, her mood has improved and rated depression as 5/10, with 10 being the worst.  She reports that gabapentin 200 mg p.o. 3 times daily is working for her anxiety, however, rates anxiety as 7/10, with 10 being the worst.  Her affect is congruent with mood. Appearance is clean and well-groomed.  [name] endorses sleeping for about 5 hours last night.  Her appetite continues to improve.  Patient is visible on the unit, attending group sessions and interacting with other patients. She continues to take her scheduled medication without somatic discomfort.  Noted from nurses report, patient received PRN of trazodone for sleep within the past 24 hours.  She is encouraged to continue to attend group sessions to help her learn coping skills and to enhance her healing process. [name] currently denies any SI, HI, AVH, delusional thoughts or paranoia. She does not appear to be responding to any internal stimuli. Will continue current treatment plan as already in progress. Reviewed Vital signs, pulse rate is improved at 63 . Patient  continues on propranolol 5 mg p.o. bid as ordered.  Principal Problem: Bipolar 1 disorder, depressed, severe (HCC)  Diagnosis: Principal Problem:   Bipolar 1 disorder, depressed, severe (HCC) Active Problems:   Anxiety   Depression   Bipolar 1 disorder (HCC)  Total Time spent with patient: 30 minutes  Past Psychiatric History: Previous Psych Diagnoses:  Prior inpatient treatment: Yes Current/prior outpatient treatment: Yes Prior rehab hx: No Psychotherapy hx: Yes History of suicide: Yes, attempted suicide via OD on medication in 2015 History of homicide or aggression: No Psychiatric medication history: Yes, see HPI Psychiatric medication compliance history: Yes Neuromodulation history: No Current Psychiatrist: Psychiatrist at [facility] Current therapist: None  Past Medical History:  Past Medical History:  Diagnosis Date   Allergic conjunctivitis    Allergy    Anxiety 06/04/2011   panic attacks   Astigmatism    glasses   Bipolar 1 disorder (HCC)    Borderline personality disorder (HCC)    Depression    Obesity    Post traumatic stress disorder    Urinary tract infection May 22, 1993   recurrent, nl [facility])    Past Surgical History:  Procedure Laterality Date   INGUINAL HERNIA REPAIR  7/95   bilat exploration, left repair   UMBILICAL HERNIA REPAIR  7/95   repaired as infant   Family History:  Family History  Problem Relation Age of Onset   Hypertension Mother    Hypothyroidism Mother    Hypertension Father    Brain cancer Maternal Grandfather    Cancer Other    Family Psychiatric  History: Psych: Mother diagnosed with bipolar, brother diagnosed with bipolar, depression, anxiety, PTSD.  Father has history of homicidal ideation and killed patient's stepfather in 2014, currently in prison. Psych Rx: Yes SA/HA: Mom shot herself in the stomach for suicide attempt 22 years ago.  And father had HA as indicated above. Substance use family hx: Yes.  Father and mom  alcoholics.  Mother abuses pain pills    Social History:  Social History   Substance and Sexual Activity  Alcohol Use No     Social History   Substance and Sexual Activity  Drug Use Yes   Types: Other-see comments   Comment: pt uses delta 8    Social History   Socioeconomic History   Marital status: Single    Spouse name: Not on file   Number of children: Not on file   Years of education: Not on file   Highest education level: Not on file  Occupational History   Not on file  Tobacco Use   Smoking status: Every Day    Packs/day: 1.00    Types: Cigarettes   Smokeless tobacco: Current   Tobacco comments:    Vapes delta 8  Vaping Use   Vaping Use: Every day  Substance and Sexual Activity   Alcohol use: No   Drug use: Yes    Types: Other-see comments    Comment: pt uses delta 8   Sexual activity: Not Currently    Birth control/protection: Pill, Condom  Other Topics Concern   Not on file  Social History Narrative   Graduated HS   27 years old   Living with grandmother   Moving to [place] Determinants of Health   Financial Resource Strain: Unknown (10/18/2018)   Overall Financial Resource Strain (CARDIA)    Difficulty of Paying Living Expenses: Patient refused  Food Insecurity: No Food Insecurity (01/24/2022)   Hunger Vital Sign    Worried About Running Out of Food in the Last Year: Never true    Ran Out of Food in the Last Year: Never true  Transportation Needs: No Transportation Needs (01/24/2022)   PRAPARE - Hydrologist (Medical): No    Lack of Transportation (Non-Medical): No  Physical Activity: Unknown (10/18/2018)   Exercise Vital Sign    Days of Exercise per Week: Patient refused    Minutes of Exercise per Session: Patient refused  Stress: Not on file  Social Connections: Unknown (10/18/2018)   Social Connection and Isolation Panel [NHANES]    Frequency of Communication with Friends and Family: Patient  refused    Frequency of Social Gatherings with Friends and Family: Patient refused    Attends Religious Services: Patient refused    Marine scientist or Organizations: Patient refused    Attends Archivist Meetings: Patient refused    Marital Status: Patient refused   Additional Social History:   Sleep: Good  Appetite:  Good  Current Medications: Current Facility-Administered Medications  Medication Dose Route Frequency Provider Last Rate Last Admin   acetaminophen (TYLENOL) tablet 650 mg  650 mg Oral Q6H PRN Leevy-Johnson, Brooke A, NP   650 mg at 01/25/22 1608   alum & mag hydroxide-simeth (MAALOX/MYLANTA) 200-200-20 MG/5ML suspension 30 mL  30 mL Oral Q4H PRN Leevy-Johnson, Brooke A, NP       gabapentin (NEURONTIN) capsule 200 mg  200 mg Oral TID Janine Limbo, MD   200 mg at 01/30/22  0831   hydrOXYzine (ATARAX) tablet 50 mg  50 mg Oral TID PRN Cecilie Lowers, FNP   50 mg at 01/27/22 2125   influenza vac split quadrivalent PF (FLUARIX) injection 0.5 mL  0.5 mL Intramuscular Tomorrow-1000 Nkwenti, Doris, NP       lamoTRIgine (LAMICTAL) tablet 100 mg  100 mg Oral BID Cecilie Lowers, FNP   100 mg at 01/30/22 9147   lurasidone (LATUDA) tablet 80 mg  80 mg Oral Q breakfast Massengill, Harrold Donath, MD   80 mg at 01/30/22 8295   magnesium hydroxide (MILK OF MAGNESIA) suspension 30 mL  30 mL Oral Daily PRN Leevy-Johnson, Brooke A, NP   30 mL at 01/27/22 1819   mirtazapine (REMERON SOL-TAB) disintegrating tablet 30 mg  30 mg Oral QHS Paiden Cavell, Jesusita Oka, FNP   30 mg at 01/29/22 2125   nicotine (NICODERM CQ - dosed in mg/24 hours) patch 21 mg  21 mg Transdermal Daily Starleen Blue, NP   21 mg at 01/30/22 0832   ondansetron (ZOFRAN-ODT) disintegrating tablet 4 mg  4 mg Oral Q8H PRN Ilse Billman, Jesusita Oka, FNP       pneumococcal 20-valent conjugate vaccine (PREVNAR 20) injection 0.5 mL  0.5 mL Intramuscular Tomorrow-1000 Massengill, Nathan, MD       propranolol (INDERAL) tablet 5 mg  5 mg Oral  Q12H Massengill, Nathan, MD   5 mg at 01/30/22 0831   risperiDONE (RISPERDAL M-TABS) disintegrating tablet 2 mg  2 mg Oral Q8H PRN Leevy-Johnson, Brooke A, NP   2 mg at 01/25/22 1606   And   ziprasidone (GEODON) injection 20 mg  20 mg Intramuscular PRN Leevy-Johnson, Brooke A, NP       topiramate (TOPAMAX) tablet 50 mg  50 mg Oral Daily Massengill, Nathan, MD   50 mg at 01/30/22 0831   traZODone (DESYREL) tablet 100 mg  100 mg Oral QHS PRN Cecilie Lowers, FNP   100 mg at 01/29/22 2126   Lab Results:  No results found for this or any previous visit (from the past 48 hour(s)).   Blood Alcohol level:  Lab Results  Component Value Date   [facility] <11 05/08/2011   ETH <11 08/31/2010    Metabolic Disorder Labs: Lab Results  Component Value Date   HGBA1C 5.2 01/26/2022   MPG 103 01/26/2022   MPG 97 10/19/2018   No results found for: "PROLACTIN" Lab Results  Component Value Date   CHOL 191 10/19/2018   TRIG 82 10/19/2018   HDL 56 10/19/2018   CHOLHDL 3.4 10/19/2018   VLDL 16 10/19/2018   LDLCALC 119 (H) 10/19/2018   LDLCALC 96 11/07/2016    Physical Findings: AIMS: Facial and Oral Movements Muscles of Facial Expression: None, normal Lips and Perioral Area: None, normal Jaw: None, normal Tongue: None, normal,Extremity Movements Upper (arms, wrists, hands, fingers): None, normal Lower (legs, knees, ankles, toes): None, normal, Trunk Movements Neck, shoulders, hips: None, normal, Overall Severity Severity of abnormal movements (highest score from questions above): None, normal Incapacitation due to abnormal movements: None, normal Patient's awareness of abnormal movements (rate only patient's report): No Awareness, Dental Status Current problems with teeth and/or dentures?: No Does patient usually wear dentures?: No  CIWA:    COWS:     Musculoskeletal: Strength & Muscle Tone: within normal limits Gait & Station: normal Patient leans: N/A  Psychiatric Specialty  Exam:  Presentation  General Appearance:  Appropriate for Environment; Casual; Fairly Groomed  Eye Contact: Absent  Speech: Clear and  Coherent; Normal Rate  Speech Volume: Normal  Handedness: Right  Mood and Affect  Mood: Anxious; Depressed  Affect: Appropriate  Thought Process  Thought Processes: Coherent; Goal Directed  Descriptions of Associations:Intact  Orientation:Full (Time, Place and Person)  Thought Content:Logical  History of Schizophrenia/Schizoaffective disorder:No data recorded Duration of Psychotic Symptoms:No data recorded Hallucinations:Hallucinations: None  Ideas of Reference:None  Suicidal Thoughts:Suicidal Thoughts: No  Homicidal Thoughts:Homicidal Thoughts: No  Sensorium  Memory: Immediate Good; Recent Good  Judgment: Fair  Insight: Fair  Community education officer  Concentration: Good  Attention Span: Good  Recall: [place] of Knowledge: Fair  Language: Good  Psychomotor Activity  Psychomotor Activity: Psychomotor Activity: Normal  Assets  Assets: Communication Skills; Desire for Improvement  Sleep  Sleep: Sleep: Good Number of Hours of Sleep: 5  Physical Exam: Physical Exam Vitals and nursing note reviewed.  HENT:     Head: Normocephalic.     Nose: Nose normal.     Mouth/Throat:     Pharynx: Oropharynx is clear.  Eyes:     Pupils: Pupils are equal, round, and reactive to light.  Cardiovascular:     Rate and Rhythm: Normal rate.     Comments: Elevated pulse rate: 117. Patient is currently is no apparent distress. Pulmonary:     Effort: Pulmonary effort is normal.  Abdominal:     Palpations: Abdomen is soft.  Genitourinary:    Comments: Deferred Musculoskeletal:        General: Normal range of motion.     Cervical back: Normal range of motion.  Skin:    General: Skin is warm.  Neurological:     General: No focal deficit present.     Mental Status: She is oriented to person, place, and time.   Psychiatric:        Mood and Affect: Mood normal.        Behavior: Behavior normal.    Review of Systems  Constitutional: Negative.   HENT: Negative.    Eyes: Negative.   Respiratory: Negative.    Cardiovascular: Negative.   Gastrointestinal: Negative.   Genitourinary: Negative.   Musculoskeletal: Negative.   Skin: Negative.   Neurological: Negative.   Endo/Heme/Allergies: Negative.   Psychiatric/Behavioral:  Positive for depression and substance abuse. The patient is nervous/anxious and has insomnia.    Blood pressure 104/77, pulse 63, temperature 98.1 F (36.7 C), temperature source Oral, resp. rate 16, height 5\' 6"  (1.676 m), weight 75.8 kg, SpO2 100 %. Body mass index is 26.95 kg/m.  Treatment Plan Summary: Daily contact with patient to assess and evaluate symptoms and progress in treatment and Medication management.   Continue inpatient hospitalization.  Will continue today 01/30/2022 plan as below except where it is noted.    Diagnosis/ principal Problem:   Bipolar 1 disorder, depressed, severe ([place])   Plan -Increased Latuda from 60 mg po daily to 80 mg p.o. daily for mood stabilization 12/30/2022 -Continue lamotrigine 100 mg po bid daily for mood stabilization. -Decrease Topamax from 100 mg po 50 mg p.o. daily for mood stabilization 12/30/2022 -Continue Trazodone 100 mg po Q hs prn for insomnia.   Depression: -Decrease mirtazapine-ODT 45 mg to 30 mg po Q hs 12/30/2022   Anxiety: -Continue hydroxyzine 50 mg po tid prn.  -Increase gabapentin 100 mg po tid to 200 mg p.o. 3 times daily 12/30/2022 -Decrease propranolol from 10 mg po bid to 5 mg p.o. twice daily for anxiety/elevated heart rate 12/30/2022   Insomnia: Continue trazodone 100 mg po  Q hs.   Agitation protocols: Continue as recommended below. Risperidone disintegrating tab 2 mg p.o. every 8 hours as needed agitation and Lorazepam tablet 1 mg p.o. as needed anxiety, severe agitation for 1 dose only  and Ziprasidone Geodon injection 20 mg IM as needed agitation for 1 dose only            Other PRN Medications -Continue Acetaminophen 650 mg po Q 6 hrs prn for fever/pain -Continue Maalox 30 mL po Q 4 hrs prn digestion -Continue Magnesium hydroxide 30 mL po daily for constipation -Continue Zofran-odt 4 mg p.o. every 8 hours as needed for nausea or vomiting  Discharge Planning: Social work and case management to assist with discharge planning and identification of hospital follow-up needs prior to discharge Estimated LOS: 5-7 days Discharge Concerns: Need to establish a safety plan; Medication compliance and effectiveness Discharge Goals: Return home with outpatient referrals for mental health follow-up including medication management/psychotherapy.   I certify that inpatient services furnished can reasonably be expected to improve the patient's condition.     Cecilie Lowers, FNP, pmhnp, fnp-bc. 01/30/2022, 10:23 AMPatient ID: Reuben Likes, female   DOB: Jun 21, 1995, 27 y.o.   MRN: [id number] Patient ID: TALEEN PROSSER, female   DOB: 10/03/1995, 27 y.o.   MRN: [id number] Patient ID: JALESSA PEYSER, female   DOB: May 14, 1995, 27 y.o.   MRN: [id number] Patient ID: SUAN PYEATT, female   DOB: May 12, 1995, 27 y.o.   MRN: [id number]

## 2022-01-30 NOTE — Group Note (Signed)
[facility] LCSW Group Therapy Note   Group Date: 01/30/2022 Start Time: 1100 End Time: 1200   Type of Therapy/Topic:  Group Therapy:  Balance in Life  Participation Level:  Did Not Attend   Description of Group:    This group will address the concept of balance and how it feels and looks when one is unbalanced. Patients will be encouraged to process areas in their lives that are out of balance, and identify reasons for remaining unbalanced. Facilitators will guide patients utilizing problem- solving interventions to address and correct the stressor making their life unbalanced. Understanding and applying boundaries will be explored and addressed for obtaining  and maintaining a balanced life. Patients will be encouraged to explore ways to assertively make their unbalanced needs known to significant others in their lives, using other group members and facilitator for support and feedback.  Therapeutic Goals: Patient will identify two or more emotions or situations they have that consume much of in their lives. Patient will identify signs/triggers that life has become out of balance:  Patient will identify two ways to set boundaries in order to achieve balance in their lives:  Patient will demonstrate ability to communicate their needs through discussion and/or role plays  Summary of Patient Progress: Did not Attend        Therapeutic Modalities:   Cognitive Behavioral Therapy Solution-Focused Therapy Assertiveness Training   [place], LCSW
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
## 2022-01-30 NOTE — Progress Notes (Signed)
Patient observed with flat affect laying in bed this morning. Patient stated feeling very tired this morning as she did not get proper sleep last night. Patient did not eat breakfast and stated she just wanted to sleep in. Patient states her anxiety is 7 out of 10 and depression 5 out of 10.  Patient denies SI,HI, and A/V/H with no plan/intent. Patient is med compliant and denies any pain/discomfort. Patient remains cooperative on unit at this time.     01/30/22 0830  Psych Admission Type (Psych Patients Only)  Admission Status Voluntary  Psychosocial Assessment  Patient Complaints Sleep disturbance  Eye Contact Fair  Facial Expression Flat;Pensive  Affect Blunted  Speech Logical/coherent  Interaction Assertive  Motor Activity Slow  Appearance/Hygiene Unremarkable  Behavior Characteristics Cooperative  Mood Depressed  Thought Process  Coherency WDL  Content WDL  Delusions None reported or observed  Perception WDL  Hallucination None reported or observed  Judgment Impaired  Confusion None  Danger to Self  Current suicidal ideation? Denies  Agreement Not to Harm Self Yes  Description of Agreement verbal  Danger to Others  Danger to Others None reported or observed

## 2022-01-30 NOTE — Progress Notes (Signed)
   01/30/22 2100  Psych Admission Type (Psych Patients Only)  Admission Status Voluntary  Psychosocial Assessment  Patient Complaints Sleep disturbance  Eye Contact Fair  Facial Expression Flat  Affect Blunted  Speech Soft;Logical/coherent  Interaction Assertive  Motor Activity Slow  Appearance/Hygiene Unremarkable  Behavior Characteristics Cooperative  Mood Sad  Thought Process  Coherency WDL  Content WDL  Delusions None reported or observed  Perception WDL  Hallucination None reported or observed  Judgment Impaired  Confusion None  Danger to Self  Current suicidal ideation? Denies  Agreement Not to Harm Self Yes  Description of Agreement Verbally contracts for safety.  Danger to Others  Danger to Others None reported or observed   Patient alert and oriented. Presenting with a blunted affect and sad mood. Patient denies SI, HI, AVH, and pain. Patient denies anxiety and depression at this time, stating "I feel stable".  Scheduled propanolol held due to diastolic bp. Scheduled remeron administered to patient, per provider orders. PRN trazodone administered for sleep, per provider orders. Support and encouragement provided. Patient states she would like to request prescription for trazodone from provider before discharge tomorrow. Routine safety checks conducted every 15 minutes. Patient verbally contracts for safety. Patient compliant with medications and treatment plan. Patient remains safe on unit.

## 2022-01-31 ENCOUNTER — Encounter (HOSPITAL_COMMUNITY): Payer: Self-pay

## 2022-01-31 LAB — DRUG PROFILE, UR, 9 DRUGS (LABCORP)
Amphetamines, Urine: NEGATIVE ng/mL
Barbiturate, Ur: NEGATIVE ng/mL
Benzodiazepine Quant, Ur: NEGATIVE ng/mL
Cannabinoid Quant, Ur: POSITIVE ng/mL — AB
Cocaine (Metab.): NEGATIVE ng/mL
Methadone Screen, Urine: NEGATIVE ng/mL
Opiate Quant, Ur: NEGATIVE ng/mL
Phencyclidine, Ur: NEGATIVE ng/mL
Propoxyphene, Urine: NEGATIVE ng/mL

## 2022-01-31 MED ORDER — MIRTAZAPINE 30 MG PO TBDP: 30.0000 mg | ORAL_TABLET | Freq: Every day | ORAL | 0 refills | Status: DC

## 2022-01-31 MED ORDER — TRAZODONE HCL 100 MG PO TABS: 100.0000 mg | ORAL_TABLET | Freq: Every evening | ORAL | 0 refills | Status: DC | PRN

## 2022-01-31 MED ORDER — TOPIRAMATE 50 MG PO TABS: 50.0000 mg | ORAL_TABLET | Freq: Every day | ORAL | 0 refills | Status: DC

## 2022-01-31 MED ORDER — GABAPENTIN 300 MG PO CAPS: 300.0000 mg | ORAL_CAPSULE | Freq: Three times a day (TID) | ORAL | 0 refills | Status: DC

## 2022-01-31 MED ORDER — LAMOTRIGINE 100 MG PO TABS: 100.0000 mg | ORAL_TABLET | Freq: Two times a day (BID) | ORAL | 0 refills | Status: DC

## 2022-01-31 MED ORDER — HYDROXYZINE HCL 50 MG PO TABS: 50.0000 mg | ORAL_TABLET | Freq: Three times a day (TID) | ORAL | 0 refills | Status: DC | PRN

## 2022-01-31 MED ORDER — NICOTINE 21 MG/24HR TD PT24: 21.0000 mg | MEDICATED_PATCH | Freq: Every day | TRANSDERMAL | 0 refills | Status: DC

## 2022-01-31 MED ORDER — PROPRANOLOL HCL 10 MG PO TABS: 5.0000 mg | ORAL_TABLET | Freq: Two times a day (BID) | ORAL | 0 refills | Status: DC

## 2022-01-31 MED ORDER — LURASIDONE HCL 80 MG PO TABS: 80.0000 mg | ORAL_TABLET | Freq: Every day | ORAL | 0 refills | Status: DC

## 2022-01-31 NOTE — Group Note (Signed)
Date:  01/31/2022 Time:  1:45 PM  Group Topic/Focus:  Orientation:   The focus of this group is to educate the patient on the purpose and policies of crisis stabilization and provide a format to answer questions about their admission.  The group details unit policies and expectations of patients while admitted.    Participation Level:  Active  Participation Quality:  Appropriate  Affect:  Appropriate  Cognitive:  Appropriate  Insight: Appropriate  Engagement in Group:  Engaged  Modes of Intervention:  Discussion  Additional Comments:     Jerrye Beavers 01/31/2022, 1:45 PM

## 2022-01-31 NOTE — Discharge Instructions (Signed)
-  Follow-up with your outpatient psychiatric provider -instructions on appointment date, time, and address (location) are provided to you in discharge paperwork.  -Take your psychiatric medications as prescribed at discharge - instructions are provided to you in the discharge paperwork  -Follow-up with outpatient primary care doctor and other specialists -for management of preventative medicine and any chronic medical disease.  -Recommend abstinence from alcohol, tobacco, and other illicit drug use at discharge.   -If your psychiatric symptoms recur, worsen, or if you have side effects to your psychiatric medications, call your outpatient psychiatric provider, 911, [phone] or go to the nearest emergency department.  -If suicidal thoughts occur, call your outpatient psychiatric provider, 911, [phone] or go to the nearest emergency department.  Naloxone (Narcan) can help reverse an overdose when given to the victim quickly.  [place] offers free naloxone kits and instructions/training on its use.  Add naloxone to your first aid kit and you can help save a life.   Pick up your free kit at the following locations:   [place]:  [facility], [address] [place] [address] ([phone]) [facility] [address] [place] [id number] ([phone]) [facility] [address] [place] [address]  [place]: [facility] [address] [place] [address] ([phone]) [facility] [address] [place] [address] ([phone])  

## 2022-01-31 NOTE — Group Note (Signed)
Date:  01/31/2022 Time:  4:17 PM  Group Topic/Focus:  Dimensions of Wellness:   The focus of this group is to introduce the topic of wellness and discuss the role each dimension of wellness plays in total health.    Participation Level:  Active  Participation Quality:  Appropriate  Affect:  Appropriate  Cognitive:  Appropriate  Insight: Appropriate  Engagement in Group:  Engaged  Modes of Intervention:  Exploration  Additional Comments:    Jerrye Beavers 01/31/2022, 4:17 PM

## 2022-01-31 NOTE — [facility] Suicide Risk Assessment (Signed)
Suicide Risk Assessment  Discharge Assessment    [facility] Discharge Suicide Risk Assessment   Principal Problem: Bipolar 1 disorder, depressed, severe (HCC) Discharge Diagnoses: Principal Problem:   Bipolar 1 disorder, depressed, severe (HCC) Active Problems:   Anxiety   Depression   Bipolar 1 disorder (HCC)  HPI: [name] is a 27 year old Caucasian female with a past psychiatric history of bipolar I disorder depressed severe, anxiety, depression, suicidal ideations, and inpatient psychiatric hospitalizations who presented with a friend to [facility] voluntarily as a walk-in 01/24/22 for assessment after being off her psychotropic medications, staying up x3 days, increased emotional lability, racing thoughts, in the context of grandmother's death, break-up with boyfriend, and custody battle. She reported being off her medications for about 2 months and being hospitalized x1 month ago at a [facility] facility for suicidal ideations but signed out AMA due to it being a drug treatment facility and not feeling it was appropriate. She had since been sleeping 3 hrs max, experiencing increased anxiety and depression. States her grandmother who raised her passed away x3 days ago, recently broke up with her boyfriend, and her kids father is suing her for full custody. She has x1 twin brother who has autism, mother died from an overdose last year, and long-term boyfriend died in a motor vehicle accident the same week. She reports history of [facility] hospitalization dating back to 27 years old for suicidal ideations and multiple attempts. She admits inconsistent outpatient treatment and feels she is spiraling. She reports low motivation, no sleep x3 days, anhedonia, increased feelings of guilt and worthlessness, minimal energy, poor concentration and appetite, fleeting thoughts of death, and frequent crying spells. She was restarted on most recent medications:  LaTuda 40 mg daily, Trazodone 50 mg HS PRN, Hydroxyzine 25 mg TID  PRN, Agitation Orders: Risperidone 2 mg, Lorazepam 1 mg, Ziprasidone 20 mg IM as needed for agitation.   24 hr chart review: Patient remains visible in milieu noted to be participating in unit groups and activities. Medication compliant; PRN Trazodone 100 mg HS PRN for insomnia. Documented to have slept 9 hours overnight.   Assessment: Patient presents alert and oriented, calm and cooperative. Observed in dayroom interacting with peers and staff. Assessment completed in room. She reports feeling better states plan to attend grandmother's funeral tomorrow. Endorses improvement of depression symptoms. Denies any active thoughts of wanting to harm herself or anyone else, auditory or visual hallucinations. Expressed readiness for discharge. Plan to return to her friends home. She was able to verbalize current medication regimen and follow up plan; denies any medication side effects.   Total Time spent with patient: 45 minutes  HOSPITAL COURSE:  During the patient's hospitalization, patient had extensive initial psychiatric evaluation, and follow-up psychiatric evaluations every day.  Psychiatric diagnoses provided upon initial assessment: bipolar 1 disorder depressed severe, anxiety, depression, PTSD  Patient's psychiatric medications were adjusted on admission: Lurasidone increased from 40 mg to 80 mg daily with breakfast, Lamotrigine 100 mg BID, Mirtazepine 30 mg daily HS, Topiramate 50 mg daily, Trazodone 100 mg HS PRN  During the hospitalization, other adjustments were made to the patient's psychiatric medication regimen: Gabapentin 200 mg TID discontinued.   Patient's care was discussed during the interdisciplinary team meeting every day during the hospitalization.  The patient denies having side effects to prescribed psychiatric medication.  Gradually, patient started adjusting to milieu. The patient was evaluated each day by a clinical provider to ascertain response to treatment. Improvement  was noted by the  patient's report of decreasing symptoms, improved sleep and appetite, affect, medication tolerance, behavior, and participation in unit programming.  Patient was asked each day to complete a self inventory noting mood, mental status, pain, new symptoms, anxiety and concerns.    Symptoms were reported as significantly decreased or resolved completely by discharge.   On day of discharge, the patient reports that their mood is stable. The patient denied having suicidal thoughts for more than 48 hours prior to discharge.  Patient denies having homicidal thoughts.  Patient denies having auditory hallucinations.  Patient denies any visual hallucinations or other symptoms of psychosis. The patient was motivated to continue taking medication with a goal of continued improvement in mental health.   The patient reports their target psychiatric symptoms of depression responded well to the psychiatric medications, and the patient reports overall benefit other psychiatric hospitalization. Supportive psychotherapy was provided to the patient. The patient also participated in regular group therapy while hospitalized. Coping skills, problem solving as well as relaxation therapies were also part of the unit programming.  Labs were reviewed with the patient, and abnormal results were discussed with the patient.  The patient is able to verbalize their individual safety plan to this provider.  # It is recommended to the patient to continue psychiatric medications as prescribed, after discharge from the hospital.    # It is recommended to the patient to follow up with your outpatient psychiatric provider and PCP.  # It was discussed with the patient, the impact of alcohol, drugs, tobacco have been there overall psychiatric and medical wellbeing, and total abstinence from substance use was recommended the patient.ed.  # Prescriptions provided or sent directly to preferred pharmacy at discharge. Patient  agreeable to plan. Given opportunity to ask questions. Appears to feel comfortable with discharge.    # In the event of worsening symptoms, the patient is instructed to call the crisis hotline, 911 and or go to the nearest ED for appropriate evaluation and treatment of symptoms. To follow-up with primary care provider for other medical issues, concerns and or health care needs  # Patient was discharged home with a plan to follow up as noted below.  Musculoskeletal: Strength & Muscle Tone: within normal limits Gait & Station: normal Patient leans: N/A  Psychiatric Specialty Exam  Presentation  General Appearance:  Bizarre  Eye Contact: Fair  Speech: Clear and Coherent; Normal Rate  Speech Volume: Normal  Handedness: Right  Mood and Affect  Mood: Anxious; Depressed  Duration of Depression Symptoms: No data recorded Affect: Appropriate; Congruent  Thought Process  Thought Processes: Coherent; Goal Directed  Descriptions of Associations:Intact  Orientation:Full (Time, Place and Person)  Thought Content:Logical  History of Schizophrenia/Schizoaffective disorder:No data recorded Duration of Psychotic Symptoms:No data recorded Hallucinations:Hallucinations: None  Ideas of Reference:None  Suicidal Thoughts:Suicidal Thoughts: No  Homicidal Thoughts:Homicidal Thoughts: No  Sensorium  Memory: Immediate Good; Recent Good  Judgment: Fair  Insight: Fair  Community education officer  Concentration: Good  Attention Span: Good  Recall: [place] of Knowledge: Fair  Language: Good  Psychomotor Activity  Psychomotor Activity: Psychomotor Activity: Normal  Assets  Assets: Communication Skills; Desire for Improvement; Social Support; Physical Health; Housing  Sleep  Sleep: Sleep: Good Number of Hours of Sleep: 5  Physical Exam: Physical Exam Vitals and nursing note reviewed.  Constitutional:      General: She is not in acute distress.     Appearance: She is normal weight. She is not ill-appearing.  HENT:  Head: Normocephalic.     Nose: Nose normal.     Mouth/Throat:     Mouth: Mucous membranes are moist.     Pharynx: Oropharynx is clear.  Eyes:     Pupils: Pupils are equal, round, and reactive to light.  Cardiovascular:     Rate and Rhythm: Normal rate.     Pulses: Normal pulses.  Pulmonary:     Effort: Pulmonary effort is normal.  Abdominal:     General: Abdomen is flat.     Palpations: Abdomen is soft.  Musculoskeletal:        General: Normal range of motion.     Cervical back: Normal range of motion.  Skin:    General: Skin is warm and dry.  Neurological:     Mental Status: She is alert and oriented to person, place, and time. Mental status is at baseline.  Psychiatric:        Attention and Perception: Attention and perception normal. She does not perceive auditory or visual hallucinations.        Mood and Affect: Affect is blunt.        Speech: Speech normal.        Behavior: Behavior is cooperative.        Thought Content: Thought content is not paranoid or delusional. Thought content does not include homicidal or suicidal ideation. Thought content does not include homicidal or suicidal plan.        Cognition and Memory: Cognition and memory normal.        Judgment: Judgment is not impulsive or inappropriate.    Review of Systems  Psychiatric/Behavioral:  Negative for substance abuse and suicidal ideas.   All other systems reviewed and are negative.  Blood pressure 114/82, pulse 96, temperature 98.1 F (36.7 C), temperature source Oral, resp. rate 16, height 5\' 6"  (1.676 m), weight 75.8 kg, SpO2 100 %. Body mass index is 26.95 kg/m.  Mental Status Per Nursing Assessment::   On Admission:  NA  Demographic Factors:  Adolescent or young adult and Caucasian  Loss Factors: Loss of significant relationship and death of grandmother  Historical Factors: Prior suicide attempts, Family history of  mental illness or substance abuse, Anniversary of important loss, and Impulsivity  Risk Reduction Factors:   Sense of responsibility to family, Religious beliefs about death, Employed, Living with another person, especially a relative, and Positive social support  Continued Clinical Symptoms:  Bipolar Disorder:   Depressive phase  Cognitive Features That Contribute To Risk:  None    Suicide Risk:  Moderate:  Frequent suicidal ideation with limited intensity, and duration, some specificity in terms of plans, no associated intent, good self-control, limited dysphoria/symptomatology, some risk factors present, and identifiable protective factors, including available and accessible social support.   Follow-up Information     Services, [facility]. Go on 02/03/2022.   Why: You have an appointment on 02/03/22 at 9:20 am for a hospital follow up assessment, to receive therapy and medication management services. Contact information: [place] [address] [phone]                Plan Of Care/Follow-up recommendations:  Activity: as tolerated  Diet: heart healthy  Other: -Follow-up with your outpatient psychiatric provider -instructions on appointment date, time, and address (location) are provided to you in discharge paperwork.  -Take your psychiatric medications as prescribed at discharge - instructions are provided to you in the discharge paperwork  -Follow-up with outpatient primary care doctor and  other specialists -for management of preventative medicine and chronic medical disease, including: bipolar 1 disorder, anxiety, depression, PTSD  -Testing: Follow-up with outpatient provider for abnormal lab results: K 3.4, glucose 109,   -Recommend abstinence from alcohol, tobacco, and other illicit drug use at discharge.   -If your psychiatric symptoms recur, worsen, or if you have side effects to your psychiatric medications, call your outpatient  psychiatric provider, 911, [phone] or go to the nearest emergency department.  -If suicidal thoughts recur, call your outpatient psychiatric provider, 911, 988 or go to the nearest emergency department.  Loletta Parish, NP 01/31/2022, 10:51 AM

## 2022-01-31 NOTE — [facility] Suicide Risk Assessment (Signed)
[place] INPATIENT:  Family/Significant Other Suicide Prevention Education  Suicide Prevention Education:  Education Completed; 01-29-2022,  friend Myra Rude [phone]  has been identified by the patient as the family member/significant other with whom the patient will be residing, and identified as the person(s) who will aid the patient in the event of a mental health crisis (suicidal ideations/suicide attempt).  With written consent from the patient, friend Myra Rude [phone] has been provided the following suicide prevention education, prior to the and/or following the discharge of the patient.  The suicide prevention education provided includes the following: Suicide risk factors Suicide prevention and interventions National Suicide Hotline telephone number [facility] assessment telephone number [facility] Emergency Assistance [place] and/or Residential Mobile Crisis Unit telephone number  Request made of family/significant other to: Remove weapons (e.g., guns, rifles, knives), all items previously/currently identified as safety concern.   Remove drugs/medications (over-the-counter, prescriptions, illicit drugs), all items previously/currently identified as a safety concern.  friend Myra Rude [phone] verbalizes understanding of the suicide prevention education information provided.  The family member/significant other agrees to remove the items of safety concern listed above.  [name] 01/31/2022, 4:08 PM

## 2022-01-31 NOTE — BH IP Treatment Plan (Signed)
Interdisciplinary Treatment and Diagnostic Plan Update  01/31/2022 Time of Session: [place] MRN: [id number]  Principal Diagnosis: Bipolar 1 disorder, depressed, severe ([place])  Secondary Diagnoses: Principal Problem:   Bipolar 1 disorder, depressed, severe ([place]) Active Problems:   Anxiety   Depression   Bipolar 1 disorder ([place])   Current Medications:  Current Facility-Administered Medications  Medication Dose Route Frequency Provider Last Rate Last Admin   acetaminophen (TYLENOL) tablet 650 mg  650 mg Oral Q6H PRN Leevy-Johnson, Brooke A, NP   650 mg at 01/25/22 1608   alum & mag hydroxide-simeth (MAALOX/MYLANTA) 200-200-20 MG/5ML suspension 30 mL  30 mL Oral Q4H PRN Leevy-Johnson, Brooke A, NP       gabapentin (NEURONTIN) capsule 300 mg  300 mg Oral TID Janine Limbo, MD   300 mg at 01/31/22 1144   hydrOXYzine (ATARAX) tablet 50 mg  50 mg Oral TID PRN Laretta Bolster, FNP   50 mg at 01/27/22 2125   influenza vac split quadrivalent PF (FLUARIX) injection 0.5 mL  0.5 mL Intramuscular Tomorrow-1000 Nkwenti, Doris, NP       lamoTRIgine (LAMICTAL) tablet 100 mg  100 mg Oral BID Ntuen, Tina C, FNP   100 mg at 01/31/22 0942   lurasidone (LATUDA) tablet 80 mg  80 mg Oral Q breakfast Massengill, Ovid Curd, MD   80 mg at 01/31/22 0942   magnesium hydroxide (MILK OF MAGNESIA) suspension 30 mL  30 mL Oral Daily PRN Leevy-Johnson, Brooke A, NP   30 mL at 01/27/22 1819   mirtazapine (REMERON SOL-TAB) disintegrating tablet 30 mg  30 mg Oral QHS Ntuen, Tina C, FNP   30 mg at 01/30/22 2111   nicotine (NICODERM CQ - dosed in mg/24 hours) patch 21 mg  21 mg Transdermal Daily Nicholes Rough, NP   21 mg at 01/31/22 0942   ondansetron (ZOFRAN-ODT) disintegrating tablet 4 mg  4 mg Oral Q8H PRN Ntuen, Kris Hartmann, FNP       pneumococcal 20-valent conjugate vaccine (PREVNAR 20) injection 0.5 mL  0.5 mL Intramuscular Tomorrow-1000 Massengill, Nathan, MD       propranolol (INDERAL) tablet 5 mg  5 mg Oral Q12H  Massengill, Nathan, MD   5 mg at 01/31/22 0941   risperiDONE (RISPERDAL M-TABS) disintegrating tablet 2 mg  2 mg Oral Q8H PRN Leevy-Johnson, Brooke A, NP   2 mg at 01/25/22 1606   And   ziprasidone (GEODON) injection 20 mg  20 mg Intramuscular PRN Leevy-Johnson, Brooke A, NP       topiramate (TOPAMAX) tablet 50 mg  50 mg Oral Daily Massengill, Nathan, MD   50 mg at 01/31/22 0941   traZODone (DESYREL) tablet 100 mg  100 mg Oral QHS PRN Laretta Bolster, FNP   100 mg at 01/30/22 2112   PTA Medications: Medications Prior to Admission  Medication Sig Dispense Refill Last Dose   lamoTRIgine (LAMICTAL) 200 MG tablet Take 200 mg by mouth daily.   Past Week   Lurasidone HCl 120 MG TABS Take 1 tablet by mouth daily.   Past Month   mirtazapine (REMERON) 45 MG tablet Take 45 mg by mouth at bedtime.   Past Week   topiramate (TOPAMAX) 100 MG tablet Take 100 mg by mouth daily.   Past Week   VENTOLIN HFA 108 (90 Base) MCG/ACT inhaler Inhale 2 puffs into the lungs every 6 (six) hours as needed.   Past Month   traZODone (DESYREL) 50 MG tablet Take 1 tablet (50 mg  total) by mouth at bedtime as needed for sleep. 30 tablet 0     Patient Stressors: Loss of grandmother Christmas weekend   Medication change or noncompliance    Patient Strengths: Average or above average intelligence  Capable of independent living  Communication skills  Supportive family/friends   Treatment Modalities: Medication Management, Group therapy, Case management,  1 to 1 session with clinician, Psychoeducation, Recreational therapy.   Physician Treatment Plan for Primary Diagnosis: Bipolar 1 disorder, depressed, severe (HCC) Long Term Goal(s): Improvement in symptoms so as ready for discharge   Short Term Goals: Ability to identify changes in lifestyle to reduce recurrence of condition will improve Ability to verbalize feelings will improve Ability to disclose and discuss suicidal ideas Ability to demonstrate self-control will  improve Ability to identify and develop effective coping behaviors will improve Ability to maintain clinical measurements within normal limits will improve Compliance with prescribed medications will improve Ability to identify triggers associated with substance abuse/mental health issues will improve  Medication Management: Evaluate patient's response, side effects, and tolerance of medication regimen.  Therapeutic Interventions: 1 to 1 sessions, Unit Group sessions and Medication administration.  Evaluation of Outcomes: Met  Physician Treatment Plan for Secondary Diagnosis: Principal Problem:   Bipolar 1 disorder, depressed, severe (HCC) Active Problems:   Anxiety   Depression   Bipolar 1 disorder (HCC)  Long Term Goal(s): Improvement in symptoms so as ready for discharge   Short Term Goals: Ability to identify changes in lifestyle to reduce recurrence of condition will improve Ability to verbalize feelings will improve Ability to disclose and discuss suicidal ideas Ability to demonstrate self-control will improve Ability to identify and develop effective coping behaviors will improve Ability to maintain clinical measurements within normal limits will improve Compliance with prescribed medications will improve Ability to identify triggers associated with substance abuse/mental health issues will improve     Medication Management: Evaluate patient's response, side effects, and tolerance of medication regimen.  Therapeutic Interventions: 1 to 1 sessions, Unit Group sessions and Medication administration.  Evaluation of Outcomes: Met   RN Treatment Plan for Primary Diagnosis: Bipolar 1 disorder, depressed, severe (HCC) Long Term Goal(s): Knowledge of disease and therapeutic regimen to maintain health will improve  Short Term Goals: Ability to remain free from injury will improve, Ability to verbalize frustration and anger appropriately will improve, Ability to demonstrate  self-control, Ability to participate in decision making will improve, Ability to verbalize feelings will improve, Ability to disclose and discuss suicidal ideas, Ability to identify and develop effective coping behaviors will improve, and Compliance with prescribed medications will improve  Medication Management: RN will administer medications as ordered by provider, will assess and evaluate patient's response and provide education to patient for prescribed medication. RN will report any adverse and/or side effects to prescribing provider.  Therapeutic Interventions: 1 on 1 counseling sessions, Psychoeducation, Medication administration, Evaluate responses to treatment, Monitor vital signs and CBGs as ordered, Perform/monitor CIWA, COWS, AIMS and Fall Risk screenings as ordered, Perform wound care treatments as ordered.  Evaluation of Outcomes: Met   LCSW Treatment Plan for Primary Diagnosis: Bipolar 1 disorder, depressed, severe (HCC) Long Term Goal(s): Safe transition to appropriate next level of care at discharge, Engage patient in therapeutic group addressing interpersonal concerns.  Short Term Goals: Engage patient in aftercare planning with referrals and resources, Increase social support, Increase ability to appropriately verbalize feelings, Increase emotional regulation, Facilitate acceptance of mental health diagnosis and concerns, Facilitate patient progression through stages of change  regarding substance use diagnoses and concerns, Identify triggers associated with mental health/substance abuse issues, and Increase skills for wellness and recovery  Therapeutic Interventions: Assess for all discharge needs, 1 to 1 time with Social worker, Explore available resources and support systems, Assess for adequacy in community support network, Educate family and significant other(s) on suicide prevention, Complete Psychosocial Assessment, Interpersonal group therapy.  Evaluation of Outcomes:  Met   Progress in Treatment: Attending groups: Yes. Participating in groups: Yes. Taking medication as prescribed: Yes. Toleration medication: Yes. Family/Significant other contact made: Yes, individual(s) contacted:  Myra Rude [phone] Friend Patient understands diagnosis: Yes. Discussing patient identified problems/goals with staff: Yes. Medical problems stabilized or resolved: No. Denies suicidal/homicidal ideation: Yes. Issues/concerns per patient self-inventory: Yes. Other:   New problem(s) identified: No, Describe:  None Reported  New Short Term/Long Term Goal(s): Medication Stabilization  Patient Goals:  Coping Skills  Discharge Plan or Barriers: None Reported  Reason for Continuation of Hospitalization: Anxiety Depression Medication stabilization Suicidal ideation  Estimated Length of Stay: 3-7 Days  Last 3 [place] Suicide Severity Risk Score: [place] Admission (Current) from OP Visit from 01/24/2022 in [place] [facility] ED from 06/21/2021 in [place] ED from 05/03/2021 in [place] Low Risk No Risk No Risk       Last PHQ 2/9 Scores:    04/09/2018    9:22 AM 01/12/2018    9:45 AM 12/15/2017    9:28 AM  Depression screen PHQ 2/9  Decreased Interest 1 1 2   Down, Depressed, Hopeless 1 2 3   PHQ - 2 Score 2 3 5   Altered sleeping 3 3 2   Tired, decreased energy 2 3 3   Change in appetite 1 3 2   Feeling bad or failure about yourself  1 2 3   Trouble concentrating 1 3 2   Moving slowly or fidgety/restless 1 0 0  Suicidal thoughts 0 0 0  PHQ-9 Score 11 17 17     medication stabilization, elimination of SI thoughts, development of comprehensive mental wellness plan.    Scribe for Treatment Team: Windle Guard, LCSW 01/31/2022 1:36 PM

## 2022-01-31 NOTE — Discharge Summary (Signed)
Physician Discharge Summary Note  Patient:  [name] is an 27 y.o., female MRN:  [id number] DOB:  19-Dec-1995 Patient phone:  [phone] (home)  Patient address:   [place] [address],  Total Time spent with patient: 30 minutes  Date of Admission:  01/24/2022 Date of Discharge: 01/31/2022  Reason for Admission:   HPI: [name] is a 27 year old Caucasian female with a past psychiatric history of bipolar I disorder depressed severe, anxiety, depression, suicidal ideations, and inpatient psychiatric hospitalizations who presented with a friend to [facility] voluntarily as a walk-in 01/24/22 for assessment after being off her psychotropic medications, staying up x3 days, increased emotional lability, racing thoughts, in the context of grandmother's death, break-up with boyfriend, and custody battle. She reported being off her medications for about 2 months and being hospitalized x1 month ago at a [place] facility for suicidal ideations but signed out AMA due to it being a drug treatment facility and not feeling it was appropriate. She had since been sleeping 3 hrs max, experiencing increased anxiety and depression. States her grandmother who raised her passed away x3 days ago, recently broke up with her boyfriend, and her kids father is suing her for full custody. She has x1 twin brother who has autism, mother died from an overdose last year, and long-term boyfriend died in a motor vehicle accident the same week. She reports history of [facility] hospitalization dating back to 27 years old for suicidal ideations and multiple attempts. She admits inconsistent outpatient treatment and feels she is spiraling. She reports low motivation, no sleep x3 days, anhedonia, increased feelings of guilt and worthlessness, minimal energy, poor concentration and appetite, fleeting thoughts of death, and frequent crying spells. She was restarted on most recent medications:  LaTuda 40 mg daily,  Trazodone 50 mg HS PRN, Hydroxyzine 25 mg TID PRN, Agitation Orders: Risperidone 2 mg, Lorazepam 1 mg, Ziprasidone 20 mg IM as needed for agitation.    24 hr chart review: Patient remains visible in milieu noted to be participating in unit groups and activities. Medication compliant; PRN Trazodone 100 mg HS PRN for insomnia. Documented to have slept 9 hours overnight.    Assessment: Patient presents alert and oriented, calm and cooperative. Observed in dayroom interacting with peers and staff. Assessment completed in room. She reports feeling better states plan to attend grandmother's funeral tomorrow. Endorses improvement of depression symptoms. Denies any active thoughts of wanting to harm herself or anyone else, auditory or visual hallucinations. Expressed readiness for discharge. Plan to return to her friends home. She was able to verbalize current medication regimen and follow up plan; denies any medication side effects  Principal Problem: Bipolar 1 disorder, depressed, severe ([place]) Discharge Diagnoses: Principal Problem:   Bipolar 1 disorder, depressed, severe ([place]) Active Problems:   Anxiety   Depression   Bipolar 1 disorder ([place])   Past Psychiatric History:  bipolar I disorder depressed severe, anxiety, depression, suicidal ideations with multiple attempts, and inpatient psychiatric hospitalizations    Past Medical History:  Past Medical History:  Diagnosis Date   Allergic conjunctivitis    Allergy    Anxiety 06/04/2011   panic attacks   Astigmatism    glasses   Bipolar 1 disorder ([place])    Borderline personality disorder ([place])    Depression    Obesity    Post traumatic stress disorder    Urinary tract infection Aug 18, 1993   recurrent, nl [facility], [facility])    Past Surgical History:  Procedure Laterality Date   INGUINAL HERNIA REPAIR  7/95   bilat exploration, left repair   UMBILICAL HERNIA REPAIR  7/95   repaired as infant   Family History:  Family History  Problem Relation  Age of Onset   Hypertension Mother    Hypothyroidism Mother    Hypertension Father    Brain cancer Maternal Grandfather    Cancer Other    Family Psychiatric History:  Social History:  Social History   Substance and Sexual Activity  Alcohol Use No     Social History   Substance and Sexual Activity  Drug Use Yes   Types: Other-see comments   Comment: pt uses delta 8    Social History   Socioeconomic History   Marital status: Single    Spouse name: Not on file   Number of children: Not on file   Years of education: Not on file   Highest education level: Not on file  Occupational History   Not on file  Tobacco Use   Smoking status: Every Day    Packs/day: 1.00    Types: Cigarettes   Smokeless tobacco: Current   Tobacco comments:    Vapes delta 8  Vaping Use   Vaping Use: Every day  Substance and Sexual Activity   Alcohol use: No   Drug use: Yes    Types: Other-see comments    Comment: pt uses delta 8   Sexual activity: Not Currently    Birth control/protection: Pill, Condom  Other Topics Concern   Not on file  Social History Narrative   Graduated HS   27 years old   Living with grandmother   [place]         Social Determinants of Health   Financial Resource Strain: Unknown (10/18/2018)   Overall Financial Resource Strain (CARDIA)    Difficulty of Paying Living Expenses: Patient refused  Food Insecurity: No Food Insecurity (01/24/2022)   Hunger Vital Sign    Worried About Running Out of Food in the Last Year: Never true    Ran Out of Food in the Last Year: Never true  Transportation Needs: No Transportation Needs (01/24/2022)   PRAPARE - Administrator, Civil ServiceTransportation    Lack of Transportation (Medical): No    Lack of Transportation (Non-Medical): No  Physical Activity: Unknown (10/18/2018)   Exercise Vital Sign    Days of Exercise per Week: Patient refused    Minutes of Exercise per Session: Patient refused  Stress: Not on file  Social Connections:  Unknown (10/18/2018)   Social Connection and Isolation Panel [NHANES]    Frequency of Communication with Friends and Family: Patient refused    Frequency of Social Gatherings with Friends and Family: Patient refused    Attends Religious Services: Patient refused    Database administratorActive Member of Clubs or Organizations: Patient refused    Attends BankerClub or Organization Meetings: Patient refused    Marital Status: Patient refused    Hospital Course:  HOSPITAL COURSE:   During the patient's hospitalization, patient had extensive initial psychiatric evaluation, and follow-up psychiatric evaluations every day.   Psychiatric diagnoses provided upon initial assessment: bipolar 1 disorder depressed severe, anxiety, depression, PTSD   Patient's psychiatric medications were adjusted on admission: Lurasidone increased from 40 mg to 80 mg daily with breakfast, Lamotrigine 100 mg BID, Mirtazepine 30 mg daily HS, Topiramate 50 mg daily, Trazodone 100 mg HS PRN   During the hospitalization, other adjustments were made to the patient's psychiatric medication regimen: Gabapentin 200  mg TID discontinued.    Patient's care was discussed during the interdisciplinary team meeting every day during the hospitalization.   The patient denies having side effects to prescribed psychiatric medication.   Gradually, patient started adjusting to milieu. The patient was evaluated each day by a clinical provider to ascertain response to treatment. Improvement was noted by the patient's report of decreasing symptoms, improved sleep and appetite, affect, medication tolerance, behavior, and participation in unit programming.  Patient was asked each day to complete a self inventory noting mood, mental status, pain, new symptoms, anxiety and concerns.     Symptoms were reported as significantly decreased or resolved completely by discharge.    On day of discharge, the patient reports that their mood is stable. The patient denied having  suicidal thoughts for more than 48 hours prior to discharge.  Patient denies having homicidal thoughts.  Patient denies having auditory hallucinations.  Patient denies any visual hallucinations or other symptoms of psychosis. The patient was motivated to continue taking medication with a goal of continued improvement in mental health.    The patient reports their target psychiatric symptoms of depression responded well to the psychiatric medications, and the patient reports overall benefit other psychiatric hospitalization. Supportive psychotherapy was provided to the patient. The patient also participated in regular group therapy while hospitalized. Coping skills, problem solving as well as relaxation therapies were also part of the unit programming.   Labs were reviewed with the patient, and abnormal results were discussed with the patient.   The patient is able to verbalize their individual safety plan to this provider.   # It is recommended to the patient to continue psychiatric medications as prescribed, after discharge from the hospital.     # It is recommended to the patient to follow up with your outpatient psychiatric provider and PCP.   # It was discussed with the patient, the impact of alcohol, drugs, tobacco have been there overall psychiatric and medical wellbeing, and total abstinence from substance use was recommended the patient.ed.   # Prescriptions provided or sent directly to preferred pharmacy at discharge. Patient agreeable to plan. Given opportunity to ask questions. Appears to feel comfortable with discharge.    # In the event of worsening symptoms, the patient is instructed to call the crisis hotline, 911 and or go to the nearest ED for appropriate evaluation and treatment of symptoms. To follow-up with primary care provider for other medical issues, concerns and or health care needs   # Patient was discharged home with a plan to follow up as noted below.  Physical  Findings: AIMS: Facial and Oral Movements Muscles of Facial Expression: None, normal Lips and Perioral Area: None, normal Jaw: None, normal Tongue: None, normal,Extremity Movements Upper (arms, wrists, hands, fingers): None, normal Lower (legs, knees, ankles, toes): None, normal, Trunk Movements Neck, shoulders, hips: None, normal, Overall Severity Severity of abnormal movements (highest score from questions above): None, normal Incapacitation due to abnormal movements: None, normal Patient's awareness of abnormal movements (rate only patient's report): No Awareness, Dental Status Current problems with teeth and/or dentures?: No Does patient usually wear dentures?: No  CIWA:    COWS:     Musculoskeletal: Strength & Muscle Tone: within normal limits Gait & Station: normal Patient leans: N/A   Psychiatric Specialty Exam:  Presentation  General Appearance:  Bizarre  Eye Contact: Fair  Speech: Clear and Coherent; Normal Rate  Speech Volume: Normal  Handedness: Right  Mood and Affect  Mood: Anxious;  Depressed  Affect: Appropriate; Congruent   Thought Process  Thought Processes: Coherent; Goal Directed  Descriptions of Associations:Intact  Orientation:Full (Time, Place and Person)  Thought Content:Logical  History of Schizophrenia/Schizoaffective disorder:No data recorded Duration of Psychotic Symptoms:No data recorded Hallucinations:Hallucinations: None  Ideas of Reference:None  Suicidal Thoughts:Suicidal Thoughts: No  Homicidal Thoughts:Homicidal Thoughts: No   Sensorium  Memory: Immediate Good; Recent Good  Judgment: Fair  Insight: Fair   Community education officer  Concentration: Good  Attention Span: Good  Recall: [place] of Knowledge: Fair  Language: Good  Psychomotor Activity  Psychomotor Activity: Psychomotor Activity: Normal  Assets  Assets: Communication Skills; Desire for Improvement; Social Support; Physical  Health; Housing  Sleep  Sleep: Sleep: Good Number of Hours of Sleep: 5  Physical Exam: Physical Exam Vitals and nursing note reviewed.  Constitutional:      Appearance: She is normal weight.  HENT:     Head: Normocephalic.     Nose: Nose normal.     Mouth/Throat:     Mouth: Mucous membranes are moist.     Pharynx: Oropharynx is clear.  Eyes:     Pupils: Pupils are equal, round, and reactive to light.  Cardiovascular:     Rate and Rhythm: Normal rate.     Pulses: Normal pulses.  Pulmonary:     Effort: Pulmonary effort is normal.  Abdominal:     General: Abdomen is flat.     Palpations: Abdomen is soft.  Musculoskeletal:        General: Normal range of motion.     Cervical back: Normal range of motion.  Skin:    General: Skin is warm and dry.  Neurological:     Mental Status: She is alert and oriented to person, place, and time. Mental status is at baseline.  Psychiatric:        Attention and Perception: Attention and perception normal. She does not perceive auditory or visual hallucinations.        Mood and Affect: Mood and affect normal.        Speech: Speech normal.        Behavior: Behavior is cooperative.        Thought Content: Thought content is not paranoid or delusional. Thought content does not include homicidal or suicidal ideation. Thought content does not include homicidal or suicidal plan.        Cognition and Memory: Cognition and memory normal.        Judgment: Judgment normal.    Review of Systems  Psychiatric/Behavioral:  Negative for depression and suicidal ideas.   All other systems reviewed and are negative.  Blood pressure 114/82, pulse 96, temperature 98.1 F (36.7 C), temperature source Oral, resp. rate 16, height 5\' 6"  (1.676 m), weight 75.8 kg, SpO2 100 %. Body mass index is 26.95 kg/m.   Social History   Tobacco Use  Smoking Status Every Day   Packs/day: 1.00   Types: Cigarettes  Smokeless Tobacco Current  Tobacco Comments   Vapes  delta 8   Tobacco Cessation:  Prescription not provided because: patient declined   Blood Alcohol level:  Lab Results  Component Value Date   [facility] <11 05/08/2011   ETH <11 88/50/2774    Metabolic Disorder Labs:  Lab Results  Component Value Date   HGBA1C 5.2 01/26/2022   MPG 103 01/26/2022   MPG 97 10/19/2018   No results found for: "PROLACTIN" Lab Results  Component Value Date   CHOL 191 10/19/2018   TRIG  82 10/19/2018   HDL 56 10/19/2018   CHOLHDL 3.4 10/19/2018   VLDL 16 10/19/2018   LDLCALC 119 (H) 10/19/2018   LDLCALC 96 11/07/2016    See Psychiatric Specialty Exam and Suicide Risk Assessment completed by Attending Physician prior to discharge.  Discharge destination:  Home  Is patient on multiple antipsychotic therapies at discharge:  No   Has Patient had three or more failed trials of antipsychotic monotherapy by history:  No  Recommended Plan for Multiple Antipsychotic Therapies: Additional reason(s) for multiple antispychotic treatment:  follow up with outpatient provider for any further necessary medication adjustments.   Discharge Instructions     Diet - low sodium heart healthy   Complete by: As directed    Diet - low sodium heart healthy   Complete by: As directed    Increase activity slowly   Complete by: As directed    Increase activity slowly   Complete by: As directed       Allergies as of 01/31/2022       Reactions   Penicillins Anaphylaxis, Hives   Did it involve swelling of the face/tongue/throat, SOB, or low BP? yes Did it involve sudden or severe rash/hives, skin peeling, or any reaction on the inside of your mouth or nose? yes Did you need to seek medical attention at a hospital or doctor's office? yes When did it last happen?  6 years ago If all above answers are "NO", may proceed with cephalosporin use.   Latex Hives, Itching   Wound Dressing Adhesive         Medication List     STOP taking these medications     mirtazapine 45 MG tablet Commonly known as: REMERON Replaced by: mirtazapine 30 MG disintegrating tablet       TAKE these medications      Indication  gabapentin 300 MG capsule Commonly known as: NEURONTIN Take 1 capsule (300 mg total) by mouth 3 (three) times daily.  Indication: Generalized Anxiety Disorder   hydrOXYzine 50 MG tablet Commonly known as: ATARAX Take 1 tablet (50 mg total) by mouth 3 (three) times daily as needed for up to 10 doses for anxiety.  Indication: Feeling Anxious   lamoTRIgine 100 MG tablet Commonly known as: LAMICTAL Take 1 tablet (100 mg total) by mouth 2 (two) times daily. What changed:  medication strength how much to take when to take this  Indication: Manic-Depression   lurasidone 80 MG Tabs tablet Commonly known as: LATUDA Take 1 tablet (80 mg total) by mouth daily with breakfast. Start taking on: February 01, 2022 What changed:  medication strength how much to take when to take this  Indication: Depressive Phase of Manic-Depression   mirtazapine 30 MG disintegrating tablet Commonly known as: REMERON SOL-TAB Take 1 tablet (30 mg total) by mouth at bedtime. Replaces: mirtazapine 45 MG tablet  Indication: Major Depressive Disorder   nicotine 21 mg/24hr patch Commonly known as: NICODERM CQ - dosed in mg/24 hours Place 1 patch (21 mg total) onto the skin daily. Start taking on: February 01, 2022  Indication: Nicotine Addiction   propranolol 10 MG tablet Commonly known as: INDERAL Take 0.5 tablets (5 mg total) by mouth every 12 (twelve) hours.  Indication: Feeling Anxious   topiramate 50 MG tablet Commonly known as: TOPAMAX Take 1 tablet (50 mg total) by mouth daily. Start taking on: February 01, 2022 What changed:  medication strength how much to take  Indication: Antipsychotic Therapy-Induced Weight Gain   traZODone 100  MG tablet Commonly known as: DESYREL Take 1 tablet (100 mg total) by mouth at bedtime as needed for  sleep. What changed:  medication strength how much to take  Indication: Trouble Sleeping   Ventolin HFA 108 (90 Base) MCG/ACT inhaler Generic drug: albuterol Inhale 2 puffs into the lungs every 6 (six) hours as needed.  Indication: Asthma        Follow-up Information     Services, [facility]. Go on 02/03/2022.   Why: You have an appointment on 02/03/22 at 9:20 am for a hospital follow up assessment, to receive therapy and medication management services. Contact information: [address] [place] [address] [phone]                Follow-up recommendations:   Activity: as tolerated   Diet: heart healthy   Other: -Follow-up with your outpatient psychiatric provider -instructions on appointment date, time, and address (location) are provided to you in discharge paperwork.   -Take your psychiatric medications as prescribed at discharge - instructions are provided to you in the discharge paperwork   -Follow-up with outpatient primary care doctor and other specialists -for management of preventative medicine and chronic medical disease, including: bipolar 1 disorder, anxiety, depression, PTSD   -Testing: Follow-up with outpatient provider for abnormal lab results: K 3.4, glucose 109,    -Recommend abstinence from alcohol, tobacco, and other illicit drug use at discharge.    -If your psychiatric symptoms recur, worsen, or if you have side effects to your psychiatric medications, call your outpatient psychiatric provider, 911, [phone] or go to the nearest emergency department.   -If suicidal thoughts recur, call your outpatient psychiatric provider, 911, 988 or go to the nearest emergency department.  Comments:    Signed: Loletta Parish, NP 01/31/2022, 12:31 PM

## 2022-01-31 NOTE — Progress Notes (Signed)
Patient ID: [name], female   DOB: [birth year], 27 y.o.   MRN: [id number]   Pt ambulatory, alert, and oriented X4 on and off the unit. Education, support, and encouragement provided. Discharge summary/AVS, prescriptions, medications, and follow up appointments reviewed with pt and a copy of the AVS was given to pt. Medications "next dose" was also reviewed with pt and marked/notated on pt's med list on AVS. Suicide safety plan completed, reviewed with this RN, given to the patient, and a copy was placed in the chart. Suicide prevention resources also provided. Pt's belongings in locker #01 returned and belongings sheet signed. Pt denies SI/HI (plan and intention), and AVH, Pt denies any concerns at this time. Pt discharged to lobby.

## 2022-01-31 NOTE — Progress Notes (Signed)
   01/31/22 0600  15 Minute Checks  Location Bedroom  Visual Appearance Calm  Behavior Sleeping  Sleep (Behavioral Health Patients Only)  Calculate sleep? (Click Yes once per 24 hr at 0600 safety check) Yes  Documented sleep last 24 hours 9

## 2022-01-31 NOTE — Progress Notes (Signed)
  [facility] Adult Case Management Discharge Plan :  Will you be returning to the same living situation after discharge:  Yes,  friend Myra Rude [phone]  At discharge, do you have transportation home?:  friend Myra Rude [phone]  Do you have the ability to pay for your medications: Yes,  Insured  Release of information consent forms completed and in the chart;  Patient's signature needed at discharge.  Patient to Follow up at:  Follow-up Information     [facility]. Go on 02/03/2022.   Why: You have an appointment on 02/03/22 at 9:20 am for a hospital follow up assessment, to receive therapy and medication management services. Contact information: [place] [address] [phone]                 Next level of care provider has access to [place] and Suicide Prevention discussed: Yes,  friend Myra Rude [phone]      Has patient been referred to the Quitline?: Patient refused referral  Patient has been referred for addiction treatment: N/A Patient to continue working towards treatment goals after discharge. Patient no longer meets criteria for inpatient criteria per attending physician. Continue taking medications as prescribed, nursing to provide instructions at discharge. Follow up with all scheduled appointments.   Diondre Pulis S Chantia Amalfitano, LCSW 01/31/2022, 4:06 PM

## 2022-03-12 ENCOUNTER — Encounter: Payer: Self-pay | Admitting: Emergency Medicine

## 2022-03-12 ENCOUNTER — Emergency Department
Admission: EM | Admit: 2022-03-12 | Discharge: 2022-03-12 | Disposition: A | Discharge status: Home / Self Care | Payer: Medicaid Other | Attending: Emergency Medicine | Admitting: Emergency Medicine

## 2022-03-12 DIAGNOSIS — B9689 Other specified bacterial agents as the cause of diseases classified elsewhere: Secondary | ICD-10-CM

## 2022-03-12 DIAGNOSIS — N760 Acute vaginitis: Secondary | ICD-10-CM | POA: Insufficient documentation

## 2022-03-12 DIAGNOSIS — Z760 Encounter for issue of repeat prescription: Secondary | ICD-10-CM | POA: Insufficient documentation

## 2022-03-12 LAB — URINALYSIS, ROUTINE W REFLEX MICROSCOPIC
Bacteria, UA: NONE SEEN
Bilirubin Urine: NEGATIVE
Glucose, UA: NEGATIVE mg/dL
Hgb urine dipstick: NEGATIVE
Ketones, ur: NEGATIVE mg/dL
Nitrite: NEGATIVE
Protein, ur: NEGATIVE mg/dL
Specific Gravity, Urine: 1.003 — ABNORMAL LOW (ref 1.005–1.030)
pH: 6 (ref 5.0–8.0)

## 2022-03-12 LAB — CHLAMYDIA/NGC RT PCR (ARMC ONLY)
Chlamydia Tr: NOT DETECTED
N gonorrhoeae: NOT DETECTED

## 2022-03-12 LAB — POC URINE PREG, ED: Preg Test, Ur: NEGATIVE

## 2022-03-12 LAB — WET PREP, GENITAL
Sperm: NONE SEEN
Trich, Wet Prep: NONE SEEN
WBC, Wet Prep HPF POC: <10 (ref <10)
Yeast Wet Prep HPF POC: NONE SEEN

## 2022-03-12 MED ORDER — LAMOTRIGINE 100 MG PO TABS: 100.0000 mg | ORAL_TABLET | Freq: Two times a day (BID) | ORAL | 0 refills | Status: DC

## 2022-03-12 MED ORDER — TOPIRAMATE 50 MG PO TABS: 50.0000 mg | ORAL_TABLET | Freq: Every day | ORAL | 0 refills | Status: DC

## 2022-03-12 MED ORDER — TRAZODONE HCL 100 MG PO TABS: 100.0000 mg | ORAL_TABLET | Freq: Every evening | ORAL | 0 refills | Status: DC | PRN

## 2022-03-12 MED ORDER — METRONIDAZOLE 500 MG PO TABS: 500.0000 mg | ORAL_TABLET | Freq: Two times a day (BID) | ORAL | 0 refills | Status: AC

## 2022-03-12 MED ORDER — HYDROXYZINE HCL 50 MG PO TABS: 50.0000 mg | ORAL_TABLET | Freq: Three times a day (TID) | ORAL | 0 refills | Status: DC | PRN

## 2022-03-12 MED ORDER — GABAPENTIN 300 MG PO CAPS: 300.0000 mg | ORAL_CAPSULE | Freq: Three times a day (TID) | ORAL | 0 refills | Status: AC

## 2022-03-12 MED ORDER — LURASIDONE HCL 80 MG PO TABS: 80.0000 mg | ORAL_TABLET | Freq: Every day | ORAL | 0 refills | Status: DC

## 2022-03-12 MED ORDER — PROPRANOLOL HCL 10 MG PO TABS: 5.0000 mg | ORAL_TABLET | Freq: Two times a day (BID) | ORAL | 0 refills | Status: DC

## 2022-03-12 NOTE — ED Triage Notes (Signed)
Pt reports having sex 2 weeks ago that was unprotected. Pt was told by another female, that her partner had herpes. Pt reports a foul odor and normal discharge. Pt denies any pain. Pt also requesting help for follow up to get medications.

## 2022-03-12 NOTE — ED Notes (Signed)
Pt reports that she had intercourse with someone and was later told that she needed to be checked because he gave herpes to someone else. Pt is tearful and states that she really needs to know if she has herpes and denies any lesions or pain in her genital area, states that she does have a fishy odor that she has noticed, denies pain with urination. Pt also states that she has been off her mental health medication for a week due to being unable to get an appt because the building her appt is in is under Architect
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
## 2022-03-12 NOTE — Discharge Instructions (Addendum)
Your medications were refilled for you.  Please follow-up with your outpatient psychiatry team for further refills.  For your bacterial vaginosis, you may take the Flagyl.  Remember that you cannot drink alcohol with this medicine.  Please return for any new, worsening, or change in symptoms or other concerns.

## 2022-03-20 ENCOUNTER — Ambulatory Visit: Payer: Self-pay | Admitting: Advanced Practice Midwife

## 2022-03-20 ENCOUNTER — Encounter: Payer: Self-pay | Admitting: Advanced Practice Midwife

## 2022-03-20 DIAGNOSIS — T7412XS Child physical abuse, confirmed, sequela: Secondary | ICD-10-CM

## 2022-03-20 DIAGNOSIS — F909 Attention-deficit hyperactivity disorder, unspecified type: Secondary | ICD-10-CM

## 2022-03-20 DIAGNOSIS — Z653 Problems related to other legal circumstances: Secondary | ICD-10-CM

## 2022-03-20 DIAGNOSIS — Z720 Tobacco use: Secondary | ICD-10-CM

## 2022-03-20 DIAGNOSIS — T7412XA Child physical abuse, confirmed, initial encounter: Secondary | ICD-10-CM | POA: Insufficient documentation

## 2022-03-20 DIAGNOSIS — T7421XS Adult sexual abuse, confirmed, sequela: Secondary | ICD-10-CM

## 2022-03-20 DIAGNOSIS — T7421XA Adult sexual abuse, confirmed, initial encounter: Secondary | ICD-10-CM | POA: Insufficient documentation

## 2022-03-20 DIAGNOSIS — Z113 Encounter for screening for infections with a predominantly sexual mode of transmission: Secondary | ICD-10-CM

## 2022-03-20 HISTORY — DX: Attention-deficit hyperactivity disorder, unspecified type: F90.9

## 2022-03-20 LAB — WET PREP FOR TRICH, YEAST, CLUE
Trichomonas Exam: NEGATIVE
Yeast Exam: NEGATIVE

## 2022-03-20 LAB — HM HIV SCREENING LAB: HM HIV Screening: NEGATIVE

## 2022-03-20 LAB — HM HEPATITIS C SCREENING LAB: HM Hepatitis Screen: NEGATIVE
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
## 2022-03-20 NOTE — Progress Notes (Signed)
Pt appointment for STI screening. Seen by Mable Fill. Interested in birth control but wanted to schedule that for a different day. Initial lab results reviewed. Education and emotional support provided.

## 2022-03-24 ENCOUNTER — Encounter: Payer: Self-pay | Admitting: Emergency Medicine

## 2022-03-24 ENCOUNTER — Emergency Department: Payer: Self-pay

## 2022-03-24 ENCOUNTER — Emergency Department
Admission: EM | Admit: 2022-03-24 | Discharge: 2022-03-24 | Disposition: A | Discharge status: Home / Self Care | Payer: Self-pay | Attending: Emergency Medicine | Admitting: Emergency Medicine

## 2022-03-24 ENCOUNTER — Other Ambulatory Visit: Payer: Self-pay

## 2022-03-24 DIAGNOSIS — M79675 Pain in left toe(s): Secondary | ICD-10-CM

## 2022-03-24 DIAGNOSIS — X500XXA Overexertion from strenuous movement or load, initial encounter: Secondary | ICD-10-CM | POA: Insufficient documentation

## 2022-03-24 DIAGNOSIS — S39012A Strain of muscle, fascia and tendon of lower back, initial encounter: Secondary | ICD-10-CM

## 2022-03-24 DIAGNOSIS — M5441 Lumbago with sciatica, right side: Secondary | ICD-10-CM

## 2022-03-24 LAB — POC URINE PREG, ED: Preg Test, Ur: NEGATIVE

## 2022-03-24 MED ORDER — KETOROLAC TROMETHAMINE 30 MG/ML IJ SOLN
30.0000 mg | Freq: Once | INTRAMUSCULAR | Status: AC
  Administered 2022-03-24: 30 mg via INTRAMUSCULAR
  Filled 2022-03-24: qty 1

## 2022-03-24 MED ORDER — PREDNISONE 10 MG PO TABS: ORAL_TABLET | ORAL | 0 refills | Status: DC

## 2022-03-24 MED ORDER — ONDANSETRON 4 MG PO TBDP: 4.0000 mg | ORAL_TABLET | Freq: Three times a day (TID) | ORAL | 0 refills | Status: DC | PRN

## 2022-03-24 MED ORDER — ONDANSETRON 4 MG PO TBDP
4.0000 mg | ORAL_TABLET | Freq: Once | ORAL | Status: AC
  Administered 2022-03-24: 4 mg via ORAL
  Filled 2022-03-24: qty 1

## 2022-03-24 NOTE — ED Provider Notes (Signed)
[facility] Provider Note    Event Date/Time   First MD Initiated Contact with Patient 03/24/22 [phone]     (approximate)   History   Back Pain   HPI  [name] is a 27 y.o. female presents to the ED with complaint of right-sided back pain that began after lifting a box at work approximately 5 days ago.  Patient states that approximately approximately 60 pounds.  She denies any direct trauma to her back or previous back problems.  She has taken some ibuprofen but states due to the pain she has had some nausea and vomited the ibuprofen this morning.  Patient is also experienced some radiculopathy from her low back into her right buttocks.  Patient continues to ambulate without any assistance and denies previous back issues.  She also complains of possible foreign body in her left great toe that has become quite tender.  Patient has a history of anxiety, PTSD, bipolar 1 disorder and ADHD.     Physical Exam   Triage Vital Signs: ED Triage Vitals [03/24/22 0931]  Enc Vitals Group     BP (!) 111/91     Pulse Rate (!) 58     Resp 18     Temp 98.5 F (36.9 C)     Temp Source Oral     SpO2 99 %     Weight      Height      Head Circumference      Peak Flow      Pain Score 7     Pain Loc      Pain Edu?      Excl. in [place]?     Most recent vital signs: Vitals:   03/24/22 0931  BP: (!) 111/91  Pulse: (!) 58  Resp: 18  Temp: 98.5 F (36.9 C)  SpO2: 99%     General: Awake, no distress.  CV:  Good peripheral perfusion.  Resp:  Normal effort.  Abd:  No distention.  Other:  No point tenderness on palpation of the thoracic or lumbar spine however to the right paravertebral muscles there is moderate tenderness that reproduces patient's pain.  Range of motion is slow and guarded.  No ecchymosis, edema or abrasions noted.  Patient is able to move lower extremities without any difficulty and stand without assistance.  Reflexes are 2+ bilaterally.  Good  muscle strength at 5/5.  Also left great toe is tender without evidence of infection.  There is callus formation present to the lateral aspect.  No drainage.   ED Results / Procedures / Treatments   Labs (all labs ordered are listed, but only abnormal results are displayed) Labs Reviewed  POC URINE PREG, ED     RADIOLOGY Left great toe x-ray images were reviewed by me independent of the radiologist and no foreign body was noted.  Radiology report is negative.    PROCEDURES:  Critical Care performed:   Procedures   MEDICATIONS ORDERED IN ED: Medications  ondansetron (ZOFRAN-ODT) disintegrating tablet 4 mg (4 mg Oral Given 03/24/22 0956)  ketorolac (TORADOL) 30 MG/ML injection 30 mg (30 mg Intramuscular Given 03/24/22 1051)     IMPRESSION / MDM / ASSESSMENT AND PLAN / ED COURSE  I reviewed the triage vital signs and the nursing notes.   Differential diagnosis includes, but is not limited to, muscle skeletal pain, lumbar strain, low back pain with radiculopathy, left great toe pain, possible foreign body left great toe.  27 year old female presents to the ED with complaint of right-sided low back pain after lifting a box weighing approximately 60 pounds.  She also has a complaint of possible foreign body to her left great toe that happened several weeks ago.  Pregnancy test was negative and left great toe x-ray images did not show foreign body.  Patient was given Toradol 30 mg IM along with a Zofran due to her nausea secondary to pain.  A prescription for prednisone and Zofran was sent to the pharmacy for patient to begin taking.  Also she was given the name of the podiatrist on-call to follow-up with her left great toe pain as there was no foreign body or signs of infection to her left great toe.  She is encouraged to use ice or heat to her back as needed for discomfort and at the time of discharge had improved.       Patient's presentation is most consistent with acute  complicated illness / injury requiring diagnostic workup.  FINAL CLINICAL IMPRESSION(S) / ED DIAGNOSES   Final diagnoses:  Acute right-sided low back pain with right-sided sciatica  Strain of lumbar region, initial encounter  Great toe pain, left     Rx / DC Orders   ED Discharge Orders          Ordered    predniSONE (DELTASONE) 10 MG tablet        03/24/22 1108    ondansetron (ZOFRAN-ODT) 4 MG disintegrating tablet  Every 8 hours PRN        03/24/22 1108             Note:  This document was prepared using Dragon voice recognition software and may include unintentional dictation errors.   Johnn Hai, PA-C 03/24/22 1250    Lavonia Drafts, MD 03/24/22 [phone]

## 2022-03-24 NOTE — ED Triage Notes (Signed)
Patient to ED for right side back pain that radiates into groin. Patient states she pulled a muscle lifting a 60lb box at work approx 5 days ago. Unable to work today due to pain.

## 2022-03-24 NOTE — Discharge Instructions (Addendum)
Call the offices listed on your discharge papers to obtain a primary care provider.  You may use ice or heat to your back as needed for discomfort.  A prescription for prednisone was sent to the pharmacy for you to begin taking today along with a prescription for Zofran if needed for nausea.  If you continue to have problems with your left great toe you should call Dr. Earleen Newport and make an appointment.  His contact information is listed on your discharge papers.

## 2022-03-25 LAB — GONOCOCCUS CULTURE

## 2022-04-01 ENCOUNTER — Encounter: Payer: Self-pay | Admitting: Emergency Medicine

## 2022-04-01 ENCOUNTER — Other Ambulatory Visit: Payer: Self-pay

## 2022-04-01 ENCOUNTER — Emergency Department
Admission: EM | Admit: 2022-04-01 | Discharge: 2022-04-01 | Disposition: A | Discharge status: Home / Self Care | Payer: Self-pay | Attending: Emergency Medicine | Admitting: Emergency Medicine

## 2022-04-01 DIAGNOSIS — Z20822 Contact with and (suspected) exposure to covid-19: Secondary | ICD-10-CM | POA: Insufficient documentation

## 2022-04-01 DIAGNOSIS — B9789 Other viral agents as the cause of diseases classified elsewhere: Secondary | ICD-10-CM | POA: Insufficient documentation

## 2022-04-01 DIAGNOSIS — J028 Acute pharyngitis due to other specified organisms: Secondary | ICD-10-CM | POA: Insufficient documentation

## 2022-04-01 DIAGNOSIS — F17200 Nicotine dependence, unspecified, uncomplicated: Secondary | ICD-10-CM | POA: Insufficient documentation

## 2022-04-01 DIAGNOSIS — J029 Acute pharyngitis, unspecified: Secondary | ICD-10-CM

## 2022-04-01 LAB — RESP PANEL BY RT-PCR (RSV, FLU A&B, COVID)  RVPGX2
Influenza A by PCR: NEGATIVE
Influenza B by PCR: NEGATIVE
Resp Syncytial Virus by PCR: NEGATIVE
SARS Coronavirus 2 by RT PCR: NEGATIVE

## 2022-04-01 LAB — GROUP A STREP BY PCR: Group A Strep by PCR: NOT DETECTED

## 2022-04-01 MED ORDER — ACETAMINOPHEN 325 MG PO TABS
650.0000 mg | ORAL_TABLET | Freq: Once | ORAL | Status: AC
  Administered 2022-04-01: 650 mg via ORAL
  Filled 2022-04-01: qty 2

## 2022-04-01 NOTE — ED Triage Notes (Signed)
Pt reports cough and congestion yesterday. Woke up today with sore throat and headache.

## 2022-04-01 NOTE — ED Notes (Signed)
Patient left without discharge instructions. Sheran Luz, PA-C is aware and states patient is aware of lab results.

## 2022-04-01 NOTE — Discharge Instructions (Signed)
Your COVID/flu/RSV and strep swabs were negative.  Please return for any new, worsening, or change in symptoms or other concerns.  It was a pleasure caring for you today.
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
## 2022-04-01 NOTE — ED Notes (Signed)
Unable to assess pain due to patient leaving before discharge papers were given.

## 2022-04-08 ENCOUNTER — Ambulatory Visit: Payer: Self-pay

## 2022-04-15 ENCOUNTER — Other Ambulatory Visit: Payer: Self-pay

## 2022-04-15 ENCOUNTER — Emergency Department
Admission: EM | Admit: 2022-04-15 | Discharge: 2022-04-15 | Disposition: A | Discharge status: Home / Self Care | Payer: Self-pay | Attending: Emergency Medicine | Admitting: Emergency Medicine

## 2022-04-15 DIAGNOSIS — R197 Diarrhea, unspecified: Secondary | ICD-10-CM | POA: Insufficient documentation

## 2022-04-15 DIAGNOSIS — R112 Nausea with vomiting, unspecified: Secondary | ICD-10-CM | POA: Insufficient documentation

## 2022-04-15 DIAGNOSIS — R1084 Generalized abdominal pain: Secondary | ICD-10-CM | POA: Insufficient documentation

## 2022-04-15 LAB — COMPREHENSIVE METABOLIC PANEL
ALT: 11 U/L (ref 0–44)
AST: 17 U/L (ref 15–41)
Albumin: 4 g/dL (ref 3.5–5.0)
Alkaline Phosphatase: 59 U/L (ref 38–126)
Anion gap: 10 (ref 5–15)
BUN: 13 mg/dL (ref 6–20)
CO2: 23 mmol/L (ref 22–32)
Calcium: 9 mg/dL (ref 8.9–10.3)
Chloride: 106 mmol/L (ref 98–111)
Creatinine, Ser: 0.73 mg/dL (ref 0.44–1.00)
GFR, Estimated: >60 mL/min (ref >60)
Glucose, Bld: 90 mg/dL (ref 70–99)
Potassium: 3.7 mmol/L (ref 3.5–5.1)
Sodium: 139 mmol/L (ref 135–145)
Total Bilirubin: 0.6 mg/dL (ref 0.3–1.2)
Total Protein: 7.1 g/dL (ref 6.5–8.1)

## 2022-04-15 LAB — CBC
HCT: 44.1 % (ref 36.0–46.0)
Hemoglobin: 14.6 g/dL (ref 12.0–15.0)
MCH: 31.1 pg (ref 26.0–34.0)
MCHC: 33.1 g/dL (ref 30.0–36.0)
MCV: 94 fL (ref 80.0–100.0)
Platelets: 254 10*3/uL (ref 150–400)
RBC: 4.69 MIL/uL (ref 3.87–5.11)
RDW: 12.8 % (ref 11.5–15.5)
WBC: 7.5 10*3/uL (ref 4.0–10.5)
nRBC: 0 % (ref 0.0–0.2)

## 2022-04-15 LAB — URINALYSIS, ROUTINE W REFLEX MICROSCOPIC
Bacteria, UA: NONE SEEN
Bilirubin Urine: NEGATIVE
Glucose, UA: NEGATIVE mg/dL
Hgb urine dipstick: NEGATIVE
Ketones, ur: NEGATIVE mg/dL
Nitrite: NEGATIVE
Protein, ur: NEGATIVE mg/dL
Specific Gravity, Urine: 1.024 (ref 1.005–1.030)
pH: 6 (ref 5.0–8.0)

## 2022-04-15 LAB — LIPASE, BLOOD: Lipase: 26 U/L (ref 11–51)

## 2022-04-15 MED ORDER — LAMOTRIGINE 100 MG PO TABS: 100.0000 mg | ORAL_TABLET | Freq: Two times a day (BID) | ORAL | 1 refills | Status: DC

## 2022-04-15 MED ORDER — TRAZODONE HCL 100 MG PO TABS: 100.0000 mg | ORAL_TABLET | Freq: Every evening | ORAL | 0 refills | Status: DC | PRN

## 2022-04-15 MED ORDER — MIRTAZAPINE 30 MG PO TBDP: 30.0000 mg | ORAL_TABLET | Freq: Every day | ORAL | 1 refills | Status: DC

## 2022-04-15 MED ORDER — DICYCLOMINE HCL 10 MG PO CAPS
10.0000 mg | ORAL_CAPSULE | Freq: Once | ORAL | Status: AC
  Administered 2022-04-15: 10 mg via ORAL
  Filled 2022-04-15: qty 1

## 2022-04-15 MED ORDER — METOCLOPRAMIDE HCL 10 MG PO TABS
10.0000 mg | ORAL_TABLET | Freq: Once | ORAL | Status: AC
  Administered 2022-04-15: 10 mg via ORAL
  Filled 2022-04-15: qty 1

## 2022-04-15 MED ORDER — DICYCLOMINE HCL 10 MG PO CAPS: 10.0000 mg | ORAL_CAPSULE | Freq: Three times a day (TID) | ORAL | 0 refills | Status: DC | PRN

## 2022-04-15 MED ORDER — FAMOTIDINE 20 MG PO TABS
20.0000 mg | ORAL_TABLET | Freq: Once | ORAL | Status: AC
  Administered 2022-04-15: 20 mg via ORAL
  Filled 2022-04-15: qty 1

## 2022-04-15 MED ORDER — PROPRANOLOL HCL 10 MG PO TABS: 5.0000 mg | ORAL_TABLET | Freq: Two times a day (BID) | ORAL | 1 refills | Status: DC

## 2022-04-15 MED ORDER — GABAPENTIN 300 MG PO CAPS: 300.0000 mg | ORAL_CAPSULE | Freq: Three times a day (TID) | ORAL | 1 refills | Status: DC

## 2022-04-15 MED ORDER — METOCLOPRAMIDE HCL 10 MG PO TABS: 10.0000 mg | ORAL_TABLET | Freq: Three times a day (TID) | ORAL | 1 refills | Status: DC | PRN

## 2022-04-15 MED ORDER — TOPIRAMATE 50 MG PO TABS: 50.0000 mg | ORAL_TABLET | Freq: Every day | ORAL | 1 refills | Status: DC

## 2022-04-15 MED ORDER — HYDROXYZINE HCL 50 MG PO TABS: 50.0000 mg | ORAL_TABLET | Freq: Three times a day (TID) | ORAL | 1 refills | Status: DC | PRN

## 2022-04-15 MED ORDER — FAMOTIDINE 20 MG PO TABS: 20.0000 mg | ORAL_TABLET | Freq: Two times a day (BID) | ORAL | 0 refills | Status: DC

## 2022-04-15 NOTE — ED Notes (Signed)
Upreg neg.

## 2022-04-15 NOTE — ED Provider Notes (Signed)
[facility] Provider Note    Event Date/Time   First MD Initiated Contact with Patient 04/15/22 2028     (approximate)   History   Emesis and Abdominal Pain   HPI  [name] is a 27 y.o. female with a history of of bipolar disorder, PTSD, anxiety, and depression who presents with nausea, vomiting, diarrhea, and abdominal discomfort over the last 3 days.  The patient states that she initially was having mostly nausea and multiple episodes of nonbloody, nonbilious vomiting.  This was associated with crampy diffuse abdominal pain which feels like rumbling or gurgling.  She states the vomiting subsided today and she tried to eat a normal meal, but then the nausea and the stomach discomfort returned.  She has had multiple episodes of nonbloody diarrhea each day.  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  She denies any travel or sick contacts.  She reports that she tried taking Zofran and Phenergan at home with no significant relief.  I reviewed the past medical records.  The patient was seen in the ED on 3/5, 2/26, 2/14 for unrelated symptoms.  Her most recent inpatient admission was in January with psychiatry at which time she was treated for an acute exacerbation of her bipolar disorder.   Physical Exam   Triage Vital Signs: ED Triage Vitals  Enc Vitals Group     BP 04/15/22 1727 (!) 143/98     Pulse Rate 04/15/22 1727 (!) 101     Resp 04/15/22 1727 18     Temp 04/15/22 1727 98 F (36.7 C)     Temp Source 04/15/22 1727 Oral     SpO2 04/15/22 1727 100 %     Weight 04/15/22 1723 155 lb (70.3 kg)     Height 04/15/22 1723 5\' 6"  (1.676 m)     Head Circumference --      Peak Flow --      Pain Score 04/15/22 1723 6     Pain Loc --      Pain Edu? --      Excl. in [place]? --     Most recent vital signs: Vitals:   04/15/22 1727 04/15/22 2303  BP: (!) 143/98 107/72  Pulse: (!) 101    Resp: 18 14  Temp: 98 F (36.7 C)   SpO2: 100% 98%     General: Awake, no distress.  CV:  Good peripheral perfusion.  Resp:  Normal effort.  Abd:  Soft with mild diffuse discomfort but no focal tenderness.  No distention.  Other:  No jaundice or scleral icterus.  Moist mucous membranes.   ED Results / Procedures / Treatments   Labs (all labs ordered are listed, but only abnormal results are displayed) Labs Reviewed  URINALYSIS, ROUTINE W REFLEX MICROSCOPIC - Abnormal; Notable for the following components:      Result Value   Color, Urine YELLOW (*)    APPearance HAZY (*)    Leukocytes,Ua TRACE (*)    All other components within normal limits  LIPASE, BLOOD  COMPREHENSIVE METABOLIC PANEL  CBC  POC URINE PREG, ED     EKG    RADIOLOGY    PROCEDURES:  Critical Care performed: No  Procedures   MEDICATIONS ORDERED IN ED: Medications  metoCLOPramide (REGLAN) tablet 10 mg (10 mg Oral Given 04/15/22 2137)  dicyclomine (BENTYL) capsule 10 mg (10 mg Oral Given 04/15/22 2137)  famotidine (PEPCID) tablet 20 mg (20 mg Oral Given 04/15/22 2137)     IMPRESSION / MDM / ASSESSMENT AND PLAN / ED COURSE  I reviewed the triage vital signs and the nursing notes.  27 year old female with PMH as noted above presents with nausea, vomiting, diarrhea, abdominal discomfort over the last 3 days.  The symptoms were somewhat subsiding until the patient attempted to eat today.  On exam she is well-appearing with normal vital signs and the abdomen is soft with no focal tenderness.  Lab workup is unremarkable, with normal electrolytes, normal LFTs and lipase, and no leukocytosis.  Urinalysis is negative for acute findings.  Differential diagnosis includes, but is not limited to, foodborne illness, viral gastroenteritis, gastritis, or other benign etiology.  Given the lack of any abdominal tenderness or concerning lab findings there is no indication for imaging.  The patient does not appear  dehydrated.  We will give symptomatic treatment with Reglan, Bentyl, and famotidine and reassess.  Patient's presentation is most consistent with acute complicated illness / injury requiring diagnostic workup.  ----------------------------------------- 12:20 AM on 04/16/2022 -----------------------------------------  The patient was feeling better with the oral medications given.  She is stable for discharge home.  I counseled her on the results of the workup and on return precautions and she expressed understanding.  She also requested prescriptions for her chronic mental health and other medications since she has been unable to to arrange timely follow-up with her psychiatrist for the next couple of months.  I verified all of the medications and doses with her and have sent new prescriptions for a 30-month supply.     FINAL CLINICAL IMPRESSION(S) / ED DIAGNOSES   Final diagnoses:  Nausea vomiting and diarrhea     Rx / DC Orders   ED Discharge Orders          Ordered    dicyclomine (BENTYL) 10 MG capsule  Every 8 hours PRN        04/15/22 2309    metoCLOPramide (REGLAN) 10 MG tablet  Every 8 hours PRN        04/15/22 2309    famotidine (PEPCID) 20 MG tablet  2 times daily        04/15/22 2309    lamoTRIgine (LAMICTAL) 100 MG tablet  2 times daily        04/15/22 2309    mirtazapine (REMERON SOL-TAB) 30 MG disintegrating tablet  Daily at bedtime        04/15/22 2309    gabapentin (NEURONTIN) 300 MG capsule  3 times daily        04/15/22 2309    hydrOXYzine (ATARAX) 50 MG tablet  3 times daily PRN        04/15/22 2309    topiramate (TOPAMAX) 50 MG tablet  Daily        04/15/22 2309    traZODone (DESYREL) 100 MG tablet  At bedtime PRN        04/15/22 2309    propranolol (INDERAL) 10 MG tablet  Every 12 hours        04/15/22 2309             Note:  This document was prepared using Dragon voice recognition software and may include unintentional dictation errors.     Arta Silence, MD 04/16/22 [phone]

## 2022-04-15 NOTE — ED Triage Notes (Signed)
Pt has abd pain and vomiting and diarrhea for 4 days.  Pt taking zofran and phenergan without relief.  Pt alert  speech clear.

## 2022-04-15 NOTE — Discharge Instructions (Addendum)
Return to the ER for new, worsening, or persistent severe nausea or vomiting, abdominal pain, blood in the stool or in the vomit, fever, or any other new or worsening symptoms that concern you.

## 2022-04-17 ENCOUNTER — Ambulatory Visit (LOCAL_COMMUNITY_HEALTH_CENTER): Payer: Self-pay | Admitting: Family Medicine

## 2022-04-17 ENCOUNTER — Encounter: Payer: Self-pay | Admitting: Family Medicine

## 2022-04-17 ENCOUNTER — Ambulatory Visit: Payer: Self-pay | Admitting: Family Medicine

## 2022-04-17 VITALS — BP 117/79 | HR 112 | Temp 97.8°F | Ht 66.0 in | Wt 161.8 lb

## 2022-04-17 DIAGNOSIS — Z975 Presence of (intrauterine) contraceptive device: Secondary | ICD-10-CM | POA: Insufficient documentation

## 2022-04-17 DIAGNOSIS — Z309 Encounter for contraceptive management, unspecified: Secondary | ICD-10-CM

## 2022-04-17 DIAGNOSIS — Z30430 Encounter for insertion of intrauterine contraceptive device: Secondary | ICD-10-CM

## 2022-04-17 DIAGNOSIS — Z01419 Encounter for gynecological examination (general) (routine) without abnormal findings: Secondary | ICD-10-CM

## 2022-04-17 MED ORDER — PARAGARD INTRAUTERINE COPPER IU IUD
1.0000 | INTRAUTERINE_SYSTEM | Freq: Once | INTRAUTERINE | Status: AC
  Administered 2022-04-17: 1 via INTRAUTERINE
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
## 2022-04-17 NOTE — Progress Notes (Signed)
Pt here for PE, PAP and wants the IUD. Pt counseled on IUD and given opportunity to ask questions. Questions answered.  Harland Dingwall, RN

## 2022-04-24 LAB — IGP, RFX APTIMA HPV ASCU: PAP Smear Comment: 0

## 2022-05-18 IMAGING — CT CT ABD-PELV W/ CM
2 of 4 series · 17 of 46 positions shown, 19 images · IV contrast (APPLIED)
Comparison: March 04, 2017.

CLINICAL DATA: Acute right flank pain.

EXAM:
CT ABDOMEN AND PELVIS WITH CONTRAST
TECHNIQUE: Multidetector CT imaging of the abdomen and pelvis was performed
using the standard protocol following bolus administration of
intravenous contrast.
CONTRAST:  80mL OMNIPAQUE IOHEXOL 350 MG/ML SOLN

[Series 2: axial st · axial · 0.69mm/px · z∈[-470,-65]mm · 14 of 89 slices shown, 16 images]
[im 4/89  soft-tissue]
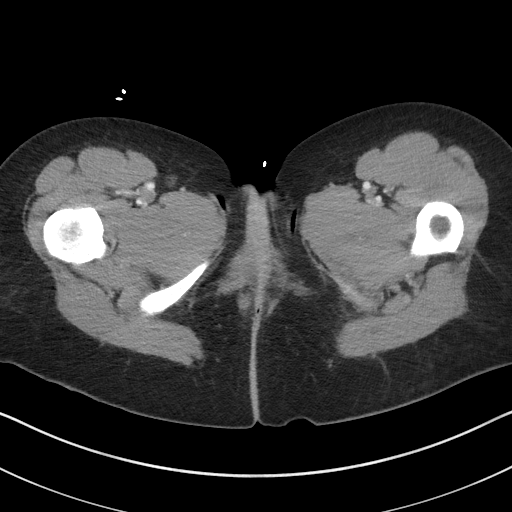
[im 4/89  bone]
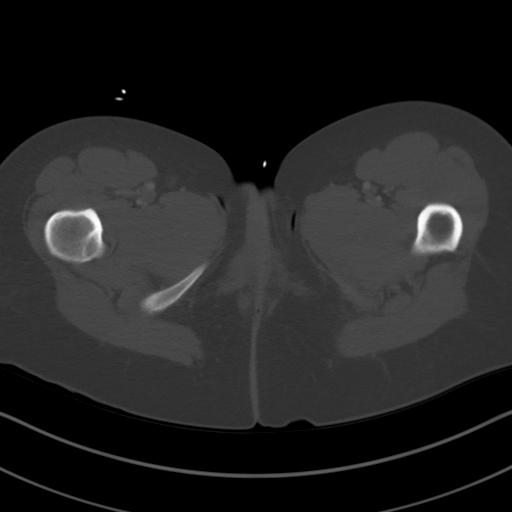
[im 12/89  soft-tissue]
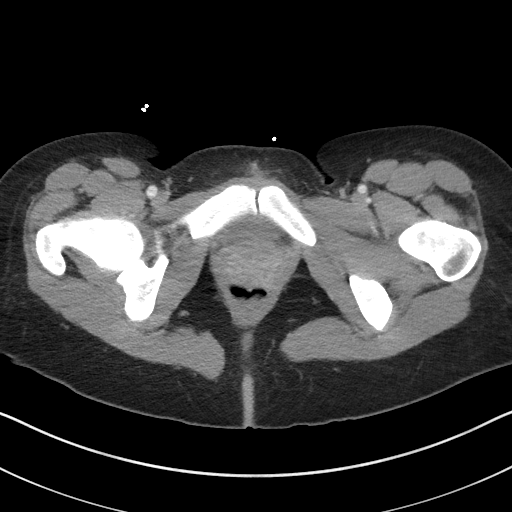
[im 16/89  soft-tissue]
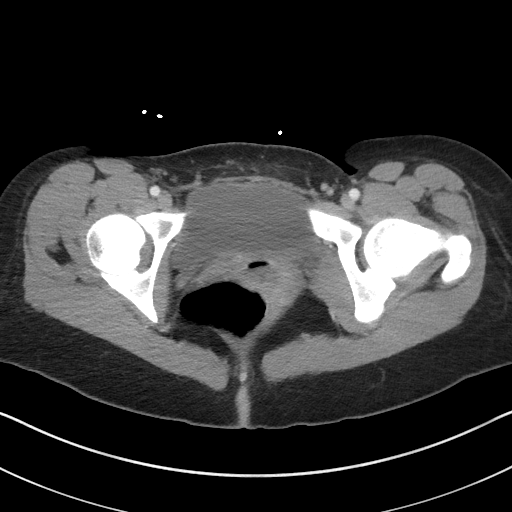
[im 23/89  soft-tissue]
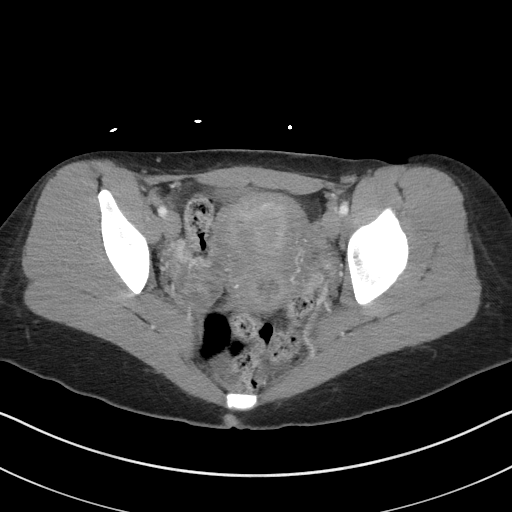
[im 31/89  soft-tissue]
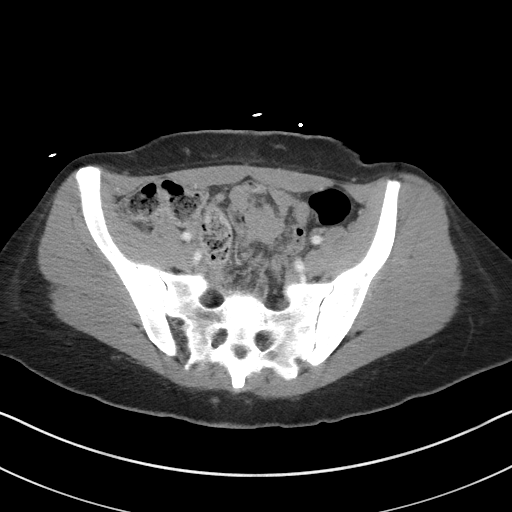
[im 35/89  soft-tissue]
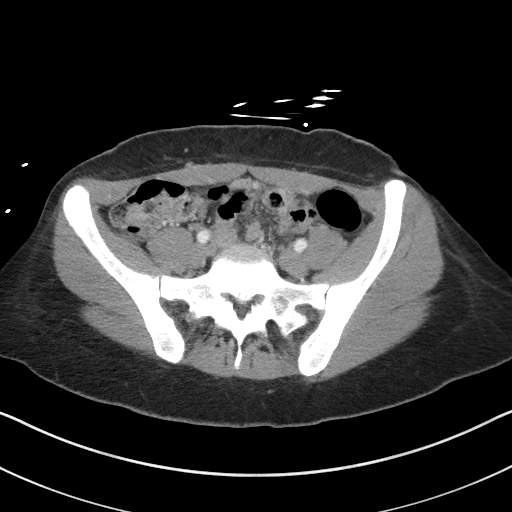
[im 43/89  soft-tissue]
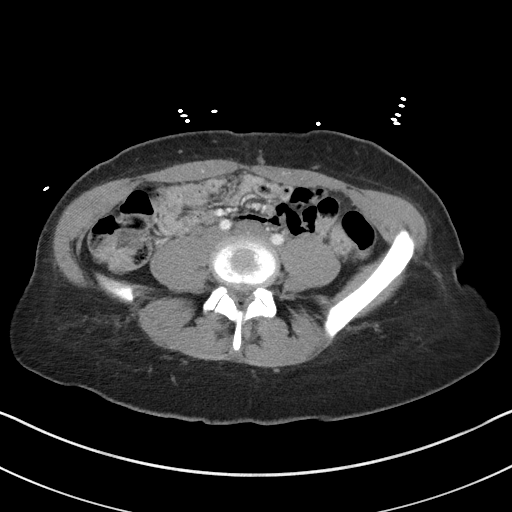
[im 46/89  soft-tissue]
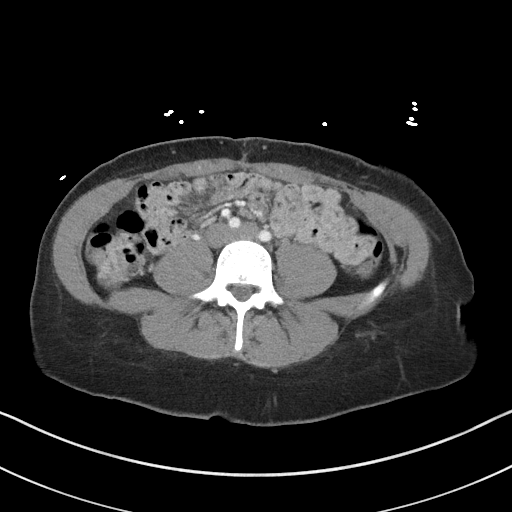
[im 54/89  soft-tissue]
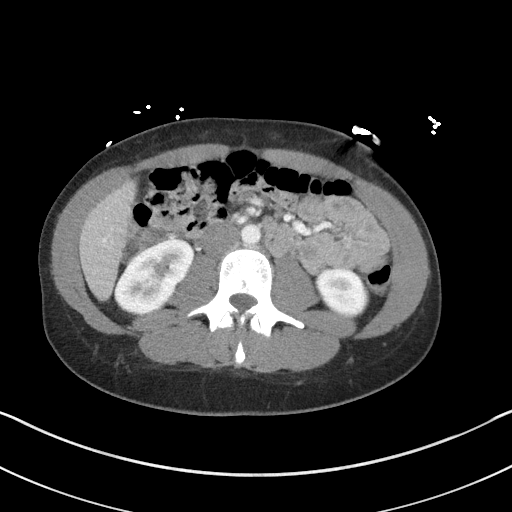
[im 54/89  bone]
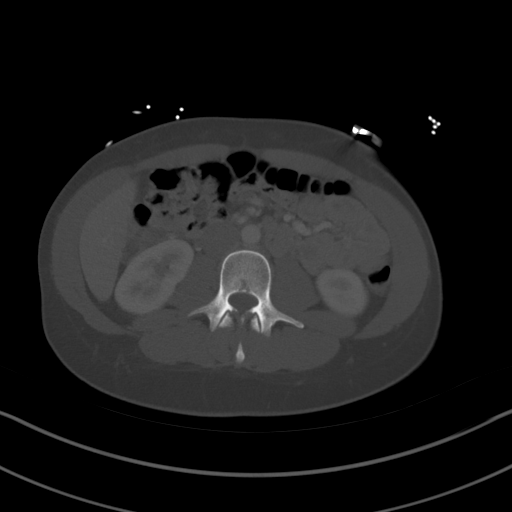
[im 58/89  soft-tissue]
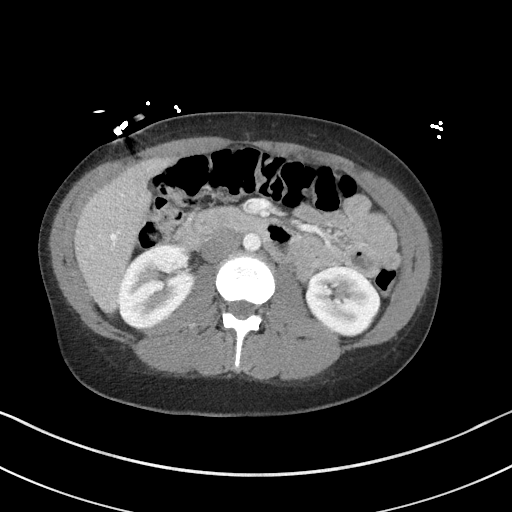
[im 66/89  soft-tissue]
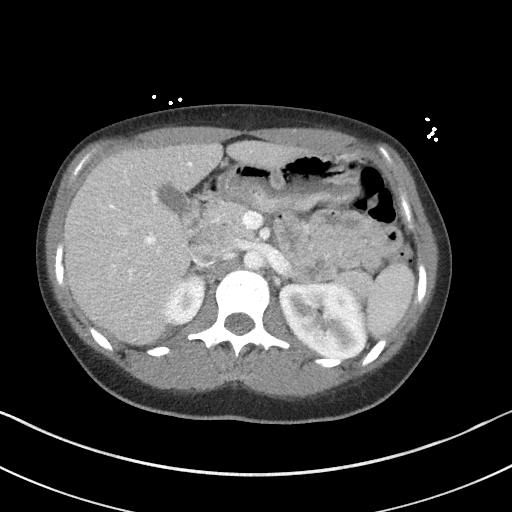
[im 73/89  soft-tissue]
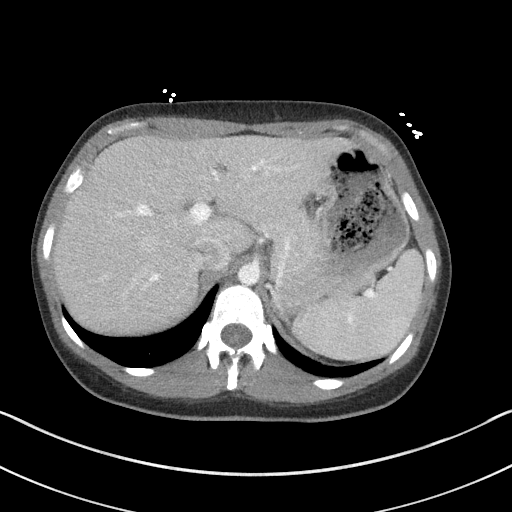
[im 77/89  soft-tissue]
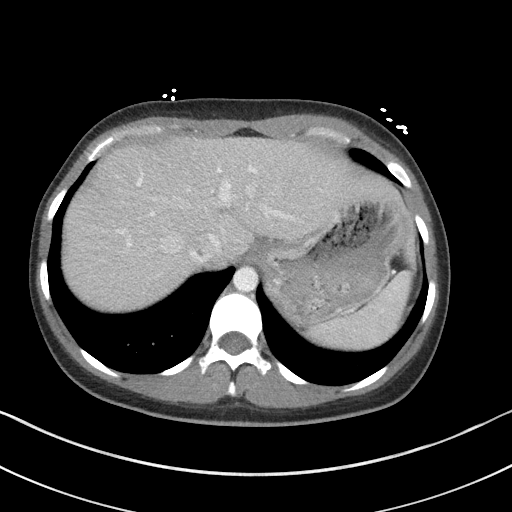
[im 85/89  soft-tissue]
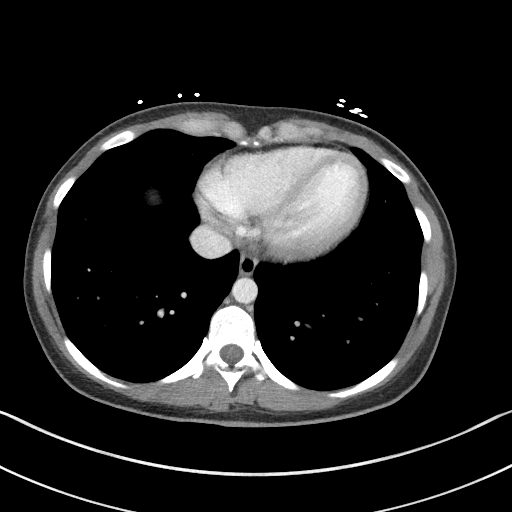

[Series 5: coronal st · coronal · 0.84mm/px · 3 of 79 slices shown]
[im 27/79  soft-tissue]
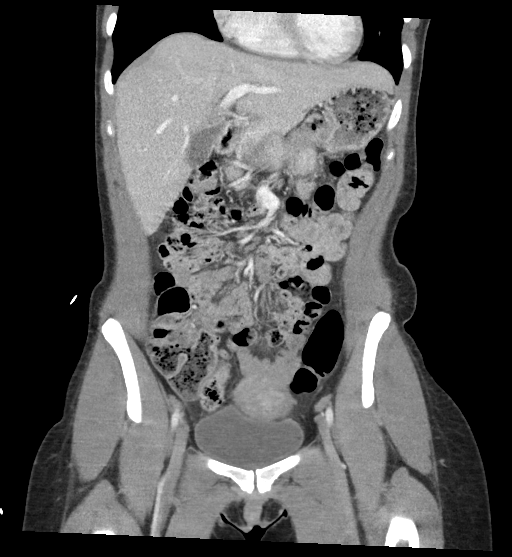
[im 35/79  soft-tissue]
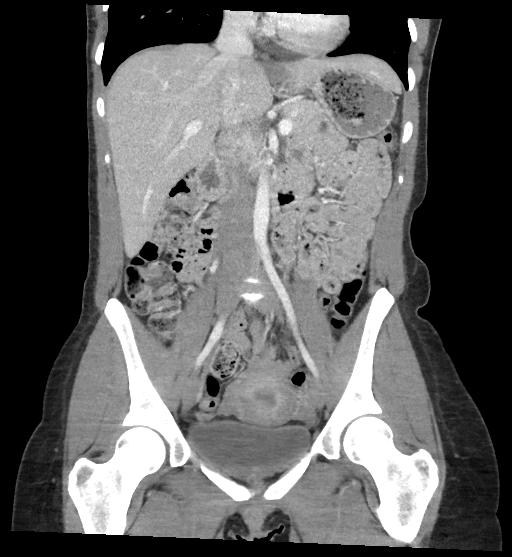
[im 44/79  soft-tissue]
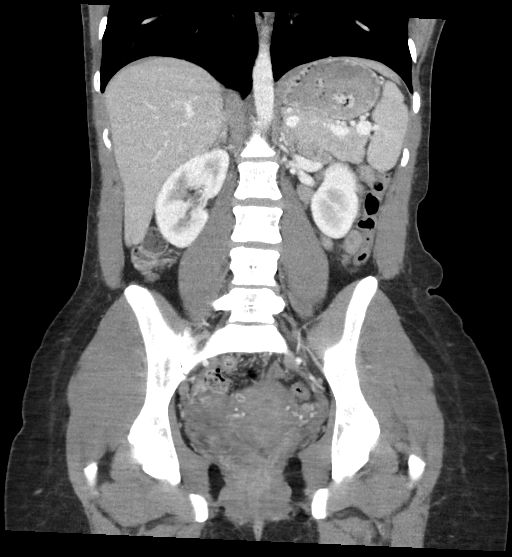

[17 of 46 positions shown; findings below may reference images not displayed]

FINDINGS: Lower chest: No acute abnormality.

Hepatobiliary: No focal liver abnormality is seen. No gallstones,
gallbladder wall thickening, or biliary dilatation.

Pancreas: Unremarkable. No pancreatic ductal dilatation or
surrounding inflammatory changes.

Spleen: Normal in size without focal abnormality.

Adrenals/Urinary Tract: Adrenal glands are unremarkable. Kidneys are
normal, without renal calculi, focal lesion, or hydronephrosis.
Bladder is unremarkable.

Stomach/Bowel: Stomach is within normal limits. Appendix appears
normal. No evidence of bowel wall thickening, distention, or
inflammatory changes.

Vascular/Lymphatic: No significant vascular findings are present. No
enlarged abdominal or pelvic lymph nodes.

Reproductive: Uterus and bilateral adnexa are unremarkable.

Other: No abdominal wall hernia or abnormality. No abdominopelvic
ascites.

Musculoskeletal: No acute or significant osseous findings.
IMPRESSION: No abnormality seen in the abdomen or pelvis.

## 2022-07-09 ENCOUNTER — Other Ambulatory Visit: Payer: Self-pay

## 2022-07-09 ENCOUNTER — Emergency Department
Admission: EM | Admit: 2022-07-09 | Discharge: 2022-07-09 | Disposition: A | Discharge status: Home / Self Care | Payer: Self-pay | Attending: Emergency Medicine | Admitting: Emergency Medicine

## 2022-07-09 ENCOUNTER — Encounter: Payer: Self-pay | Admitting: Emergency Medicine

## 2022-07-09 DIAGNOSIS — Z760 Encounter for issue of repeat prescription: Secondary | ICD-10-CM | POA: Insufficient documentation

## 2022-07-09 MED ORDER — TRAZODONE HCL 100 MG PO TABS: 100.0000 mg | ORAL_TABLET | Freq: Every evening | ORAL | 2 refills | Status: DC | PRN

## 2022-07-09 MED ORDER — TOPIRAMATE 50 MG PO TABS: 50.0000 mg | ORAL_TABLET | Freq: Every day | ORAL | 2 refills | Status: DC

## 2022-07-09 MED ORDER — GABAPENTIN 300 MG PO CAPS: 300.0000 mg | ORAL_CAPSULE | Freq: Three times a day (TID) | ORAL | 2 refills | Status: DC

## 2022-07-09 MED ORDER — MIRTAZAPINE 30 MG PO TBDP: 30.0000 mg | ORAL_TABLET | Freq: Every day | ORAL | 2 refills | Status: DC

## 2022-07-09 MED ORDER — PROPRANOLOL HCL 10 MG PO TABS: 5.0000 mg | ORAL_TABLET | Freq: Two times a day (BID) | ORAL | 2 refills | Status: DC

## 2022-07-09 MED ORDER — LURASIDONE HCL 80 MG PO TABS: 80.0000 mg | ORAL_TABLET | Freq: Every day | ORAL | 2 refills | Status: DC

## 2022-07-09 MED ORDER — HYDROXYZINE PAMOATE 100 MG PO CAPS: 100.0000 mg | ORAL_CAPSULE | Freq: Three times a day (TID) | ORAL | 2 refills | Status: AC

## 2022-07-09 MED ORDER — ALBUTEROL SULFATE HFA 108 (90 BASE) MCG/ACT IN AERS: 2.0000 | INHALATION_SPRAY | Freq: Four times a day (QID) | RESPIRATORY_TRACT | 2 refills | Status: AC | PRN

## 2022-07-09 MED ORDER — LAMOTRIGINE 100 MG PO TABS: 100.0000 mg | ORAL_TABLET | Freq: Two times a day (BID) | ORAL | 2 refills | Status: DC

## 2022-07-09 NOTE — ED Triage Notes (Signed)
Pt requesting refills of BH meds due to history bipolar depression and insomnia. She has been off all meds for several weeks and has not been able to get a doctor who can refill her prescriptions. No physical complaints at this moment.

## 2022-07-09 NOTE — ED Provider Notes (Signed)
[facility] Provider Note  Patient Contact: 5:27 PM (approximate)   History   Medication Refill   HPI  [name] is a 27 y.o. female who presents emergency department requesting medication refills.  Patient is requesting refills of all of her medications.  She does not have a primary care as she does not have insurance and cannot afford to see 1.  She is run out of her medication, it has been roughly a month to 2 months depending on the medications and she is taking her meds.  Patient states that psychiatrically doing okay but has had a coworker Reece Leader and her daughter's father passed away and she is traveling across the country with her daughter and support of her to his visitation.  Patient feels like she needs her medications so that she can put on a even keel.  No suicidal or homicidal ideation now.  All of her meds are on record.     Physical Exam   Triage Vital Signs: ED Triage Vitals  Enc Vitals Group     BP 07/09/22 1707 132/83     Pulse Rate 07/09/22 1707 (!) 104     Resp 07/09/22 1707 16     Temp 07/09/22 1707 98.9 F (37.2 C)     Temp Source 07/09/22 1707 Oral     SpO2 07/09/22 1707 98 %     Weight 07/09/22 1705 161 lb (73 kg)     Height 07/09/22 1705 5\' 6"  (1.676 m)     Head Circumference --      Peak Flow --      Pain Score 07/09/22 1705 0     Pain Loc --      Pain Edu? --      Excl. in GC? --     Most recent vital signs: Vitals:   07/09/22 1707  BP: 132/83  Pulse: (!) 104  Resp: 16  Temp: 98.9 F (37.2 C)  SpO2: 98%     General: Alert and in no acute distress.  Cardiovascular:  Good peripheral perfusion Respiratory: Normal respiratory effort without tachypnea or retractions. Lungs CTAB.  Musculoskeletal: Full range of motion to all extremities.  Neurologic:  No gross focal neurologic deficits are appreciated.  Skin:   No rash noted Other: Patient requesting medications for psychiatric complaint but has no complaints  with her psychiatric health at this time.  No suicidal homicidal ideation.   ED Results / Procedures / Treatments   Labs (all labs ordered are listed, but only abnormal results are displayed) Labs Reviewed - No data to display   EKG     RADIOLOGY    No results found.  PROCEDURES:  Critical Care performed: No  Procedures   MEDICATIONS ORDERED IN ED: Medications - No data to display   IMPRESSION / MDM / ASSESSMENT AND PLAN / ED COURSE  I reviewed the triage vital signs and the nursing notes.                                 Differential diagnosis includes, but is not limited to, bipolar disorder, anxiety, depression, insomnia, medication refill   Patient's presentation is most consistent with acute presentation with potential threat to life or bodily function.   Patient's diagnosis is consistent with medication refill.  Patient presents emergency department requesting refill of her medications.  Patient has been she cannot afford to see a primary care.  Patient is doing well psychiatrically but has experienced a loss of a colleague as well as her daughter's father passed away suddenly.  Patient is requesting meds so that she can remain calm given she will" for her daughter during this time.  Will prescribe medications for the patient at this time.  No indication for patient to see psychiatry currently.. . Patient is given ED precautions to return to the ED for any worsening or new symptoms.     FINAL CLINICAL IMPRESSION(S) / ED DIAGNOSES   Final diagnoses:  Medication refill     Rx / DC Orders   ED Discharge Orders          Ordered    gabapentin (NEURONTIN) 300 MG capsule  3 times daily        07/09/22 1714    lamoTRIgine (LAMICTAL) 100 MG tablet  2 times daily        07/09/22 1714    hydrOXYzine (VISTARIL) 100 MG capsule  3 times daily        07/09/22 1714    lurasidone (LATUDA) 80 MG TABS tablet  Daily with breakfast        07/09/22 1714     propranolol (INDERAL) 10 MG tablet  Every 12 hours        07/09/22 1714    topiramate (TOPAMAX) 50 MG tablet  Daily        07/09/22 1714    albuterol (VENTOLIN HFA) 108 (90 Base) MCG/ACT inhaler  Every 6 hours PRN        07/09/22 1714    traZODone (DESYREL) 100 MG tablet  At bedtime PRN        07/09/22 1714    mirtazapine (REMERON SOL-TAB) 30 MG disintegrating tablet  Daily at bedtime        07/09/22 1714             Note:  This document was prepared using Dragon voice recognition software and may include unintentional dictation errors.   Lanette Hampshire 07/09/22 1730    Chesley Noon, MD 07/09/22 [phone]

## 2023-01-29 IMAGING — CT CT RENAL STONE PROTOCOL
2 of 3 series · 16 of 46 positions shown, 18 images · non-contrast
Comparison: CT October 08, 2020

CLINICAL DATA: Bilateral flank pain concern for renal stone.



[Series 2: stone full standard · axial · 0.71mm/px · z∈[-1054,-648]mm · 13 of 93 slices shown, 15 images]
[im 6/93  soft-tissue]
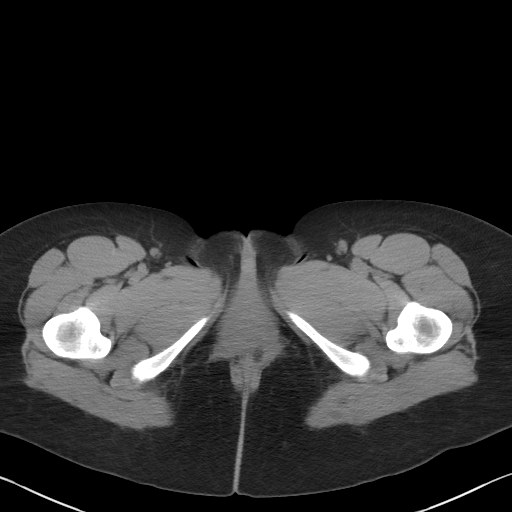
[im 6/93  bone]
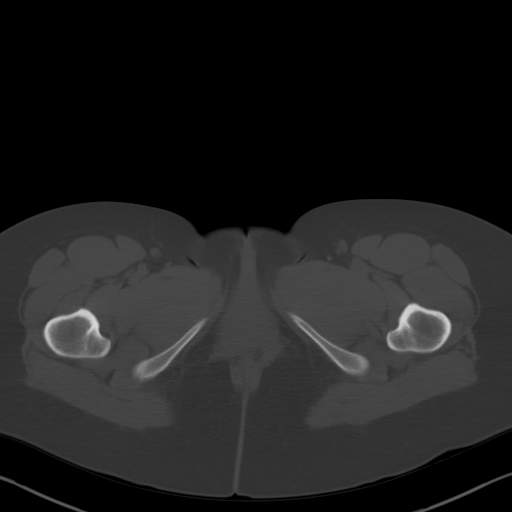
[im 12/93  soft-tissue]
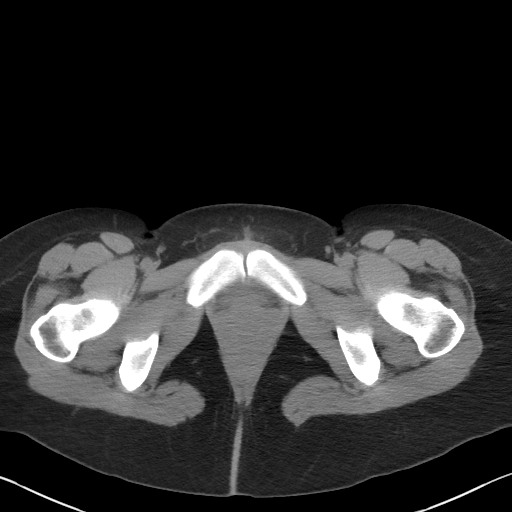
[im 18/93  soft-tissue]
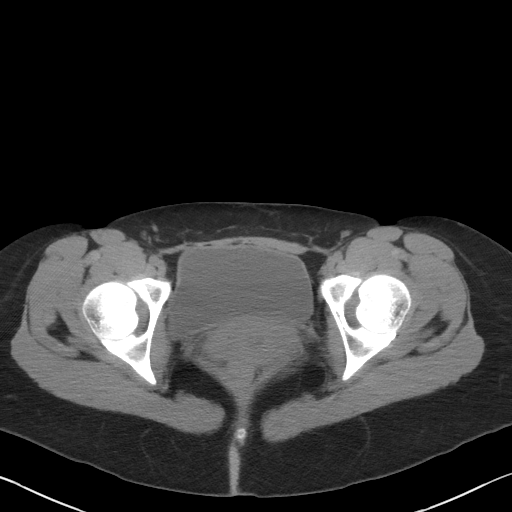
[im 27/93  soft-tissue]
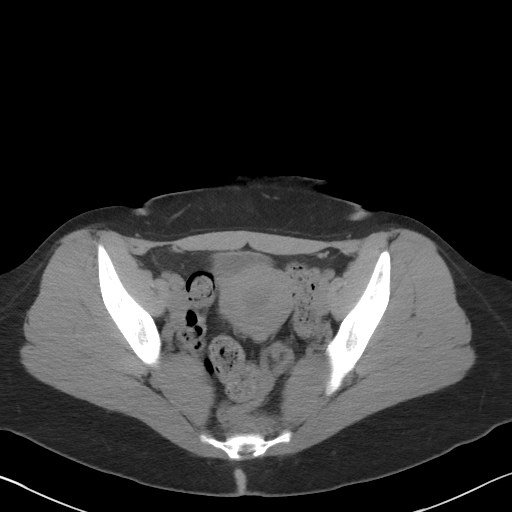
[im 33/93  soft-tissue]
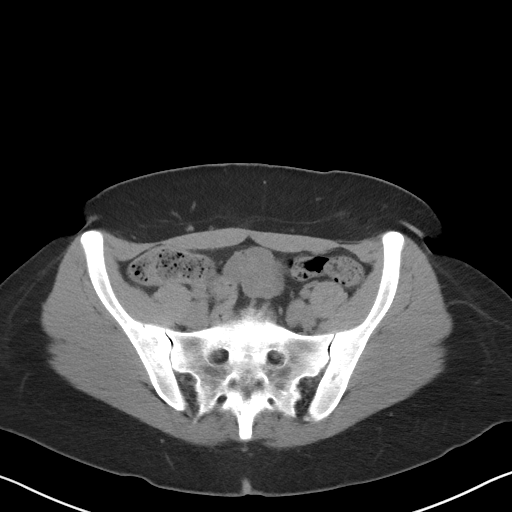
[im 39/93  soft-tissue]
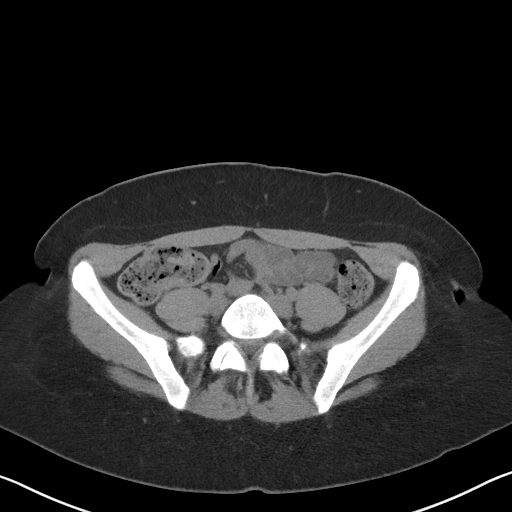
[im 48/93  soft-tissue]
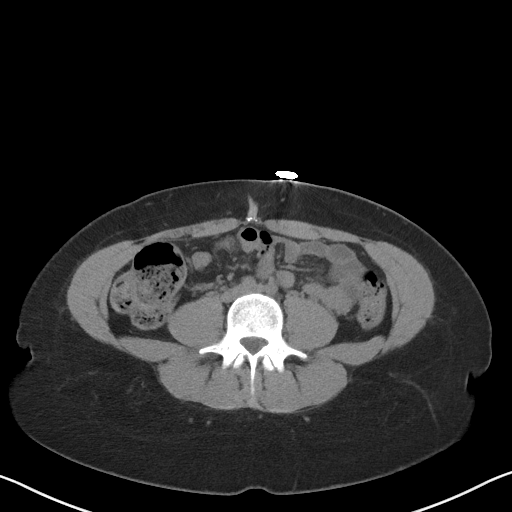
[im 54/93  soft-tissue]
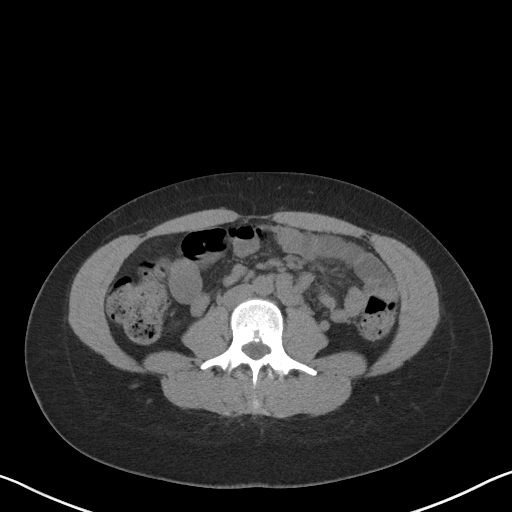
[im 60/93  soft-tissue]
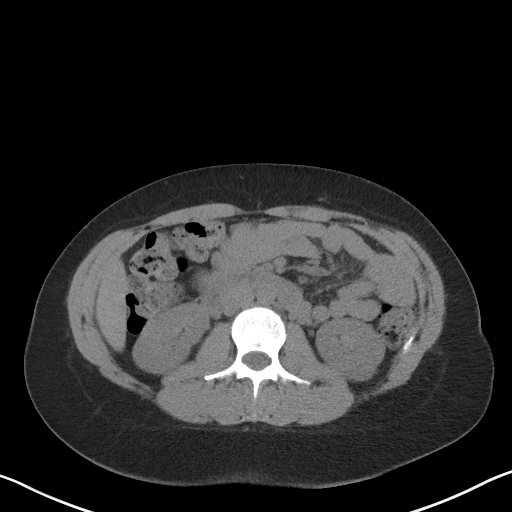
[im 60/93  bone]
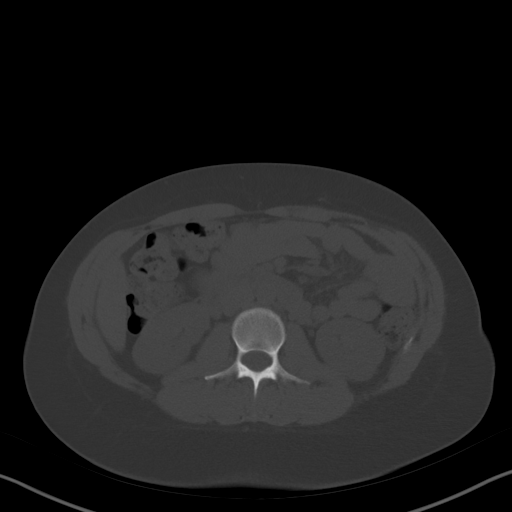
[im 66/93  soft-tissue]
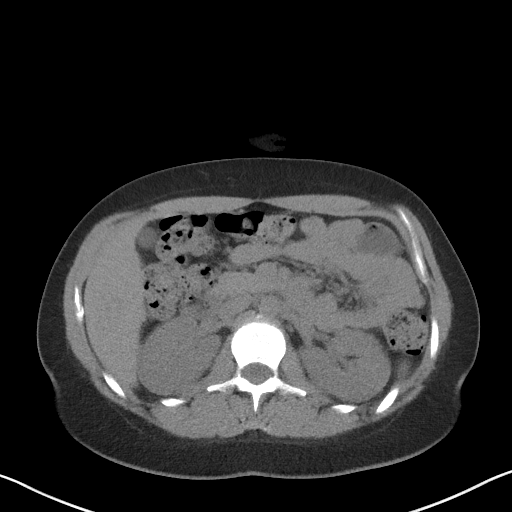
[im 75/93  soft-tissue]
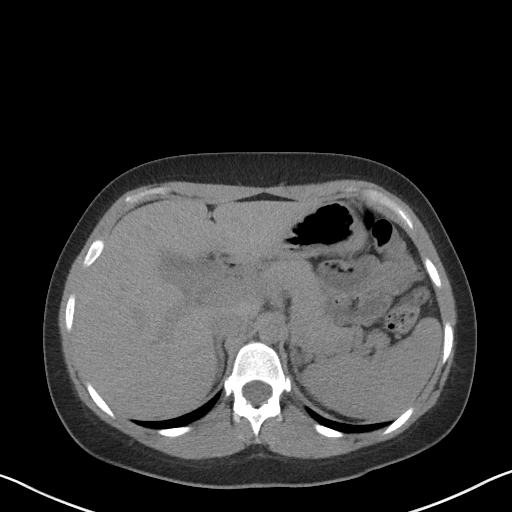
[im 81/93  soft-tissue]
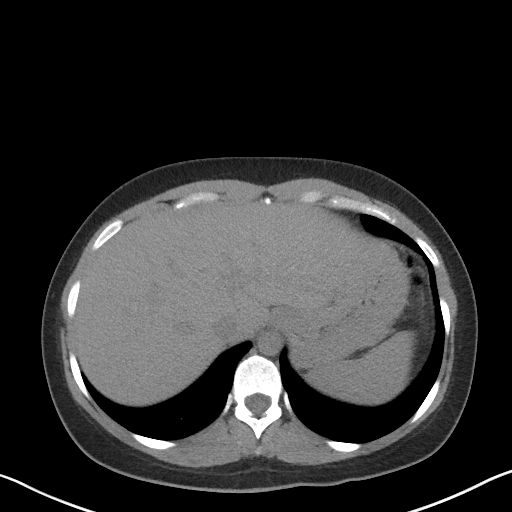
[im 87/93  soft-tissue]
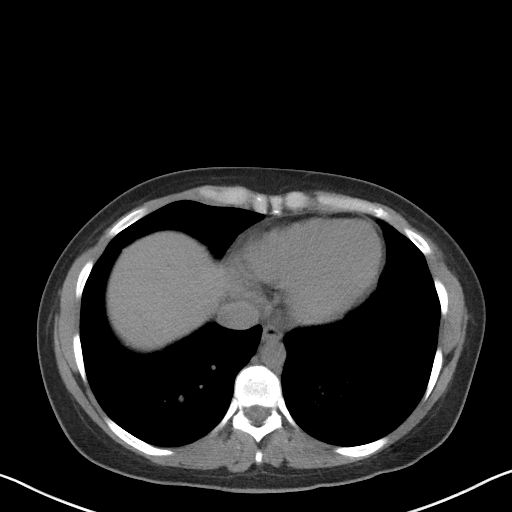

[Series 5: coronal · coronal · 0.66mm/px · 3 of 124 slices shown]
[im 42/124  soft-tissue]
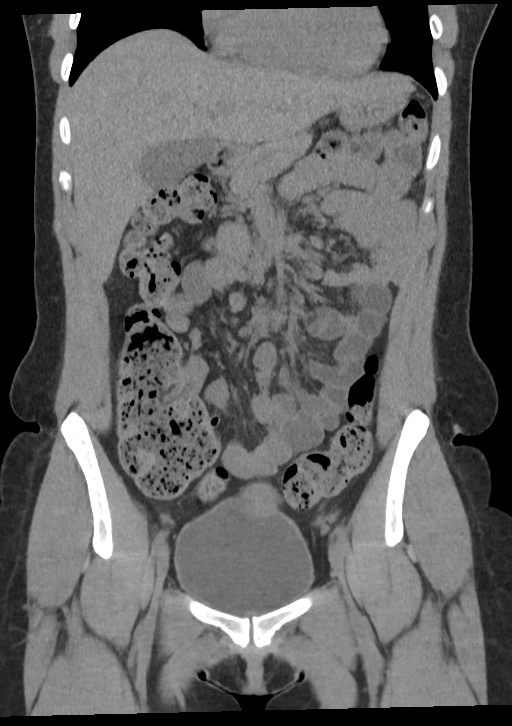
[im 55/124  soft-tissue]
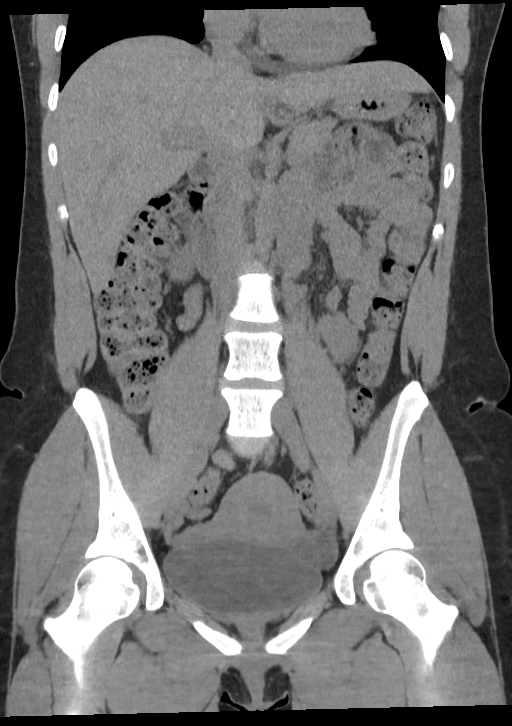
[im 69/124  soft-tissue]
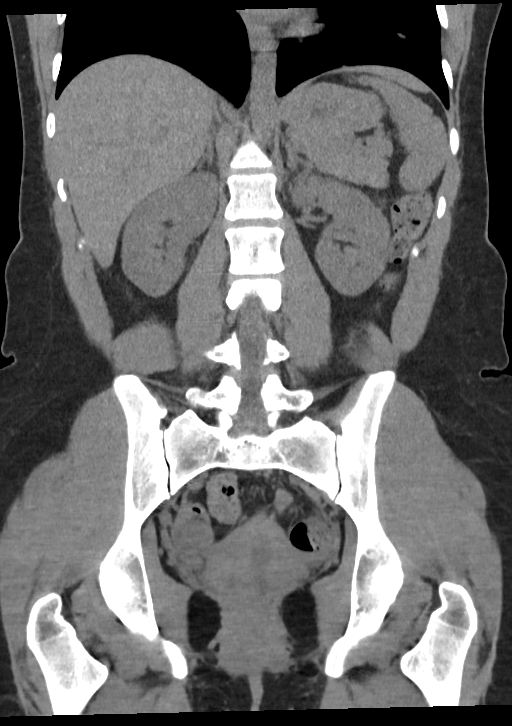

[16 of 46 positions shown; findings below may reference images not displayed]

FINDINGS: Lower chest: No acute abnormality.

Hepatobiliary: No suspicious hepatic lesion on this noncontrast
examination. Gallbladder is unremarkable. No biliary ductal
dilation.

Pancreas: No pancreatic ductal dilation or evidence of acute
inflammation.

Spleen: No splenomegaly or focal splenic lesion.

Adrenals/Urinary Tract: Bilateral adrenal glands appear normal. No
hydronephrosis. No renal, ureteral or bladder calculi identified.
Urinary bladder is unremarkable for degree of distension.

Stomach/Bowel: No radiopaque enteric contrast material was
administered. Stomach is unremarkable for degree of distension. No
pathologic dilation of large or small bowel. The appendix and
terminal ileum appear normal. Moderate volume of formed stool
throughout the colon. No evidence of acute bowel inflammation.

Vascular/Lymphatic: Normal caliber abdominal aorta. No
pathologically enlarged abdominal or pelvic lymph nodes.

Reproductive: The uterus and bilateral adnexa are normal in non
contrasted CT appearance for a reproductive age female.

Other: Trace pelvic free fluid is within physiologic normal limits.

Musculoskeletal: No acute or significant osseous findings.
IMPRESSION: 1. No acute abnormality of the abdomen or pelvis. Specifically, no
evidence of obstructive uropathy.
2. Moderate volume of formed stool throughout the colon. Correlate
for constipation.

## 2023-02-24 ENCOUNTER — Other Ambulatory Visit: Payer: Self-pay

## 2023-02-24 ENCOUNTER — Other Ambulatory Visit (HOSPITAL_BASED_OUTPATIENT_CLINIC_OR_DEPARTMENT_OTHER): Payer: Self-pay

## 2023-02-24 ENCOUNTER — Emergency Department (HOSPITAL_BASED_OUTPATIENT_CLINIC_OR_DEPARTMENT_OTHER)
Admission: EM | Admit: 2023-02-24 | Discharge: 2023-02-24 | Disposition: A | Discharge status: Home / Self Care | Payer: MEDICAID | Attending: Emergency Medicine | Admitting: Emergency Medicine

## 2023-02-24 DIAGNOSIS — Z91040 Latex allergy status: Secondary | ICD-10-CM | POA: Insufficient documentation

## 2023-02-24 DIAGNOSIS — J069 Acute upper respiratory infection, unspecified: Secondary | ICD-10-CM | POA: Insufficient documentation

## 2023-02-24 DIAGNOSIS — Z20822 Contact with and (suspected) exposure to covid-19: Secondary | ICD-10-CM | POA: Diagnosis not present

## 2023-02-24 DIAGNOSIS — R059 Cough, unspecified: Secondary | ICD-10-CM | POA: Diagnosis present

## 2023-02-24 LAB — RESP PANEL BY RT-PCR (RSV, FLU A&B, COVID)  RVPGX2
Influenza A by PCR: NEGATIVE
Influenza B by PCR: NEGATIVE
Resp Syncytial Virus by PCR: NEGATIVE
SARS Coronavirus 2 by RT PCR: NEGATIVE

## 2023-02-24 MED ORDER — ONDANSETRON 4 MG PO TBDP
ORAL_TABLET | ORAL | 0 refills | Status: AC
Start: 2023-02-24 — End: ?
  Filled 2023-02-24: qty 20, 5d supply, fill #0

## 2023-02-24 MED ORDER — MIRTAZAPINE 30 MG PO TBDP
30.0000 mg | ORAL_TABLET | Freq: Every day | ORAL | 0 refills | Status: AC
  Filled 2023-02-24: qty 30, 30d supply, fill #0

## 2023-02-24 MED ORDER — TOPIRAMATE 50 MG PO TABS
50.0000 mg | ORAL_TABLET | Freq: Every day | ORAL | 0 refills | Status: AC
  Filled 2023-02-24: qty 30, 30d supply, fill #0

## 2023-02-24 MED ORDER — BENZONATATE 100 MG PO CAPS
100.0000 mg | ORAL_CAPSULE | Freq: Three times a day (TID) | ORAL | 0 refills | Status: AC
  Filled 2023-02-24: qty 21, 7d supply, fill #0

## 2023-02-24 MED ORDER — PROPRANOLOL HCL 10 MG PO TABS
5.0000 mg | ORAL_TABLET | Freq: Two times a day (BID) | ORAL | 0 refills | Status: AC
  Filled 2023-02-24: qty 30, 30d supply, fill #0

## 2023-02-24 MED ORDER — GABAPENTIN 300 MG PO CAPS
300.0000 mg | ORAL_CAPSULE | Freq: Three times a day (TID) | ORAL | 0 refills | Status: AC
  Filled 2023-02-24: qty 90, 30d supply, fill #0

## 2023-02-24 MED ORDER — LURASIDONE HCL 80 MG PO TABS
80.0000 mg | ORAL_TABLET | Freq: Every day | ORAL | 0 refills | Status: AC
  Filled 2023-02-24: qty 30, 30d supply, fill #0

## 2023-02-24 MED ORDER — TRAZODONE HCL 100 MG PO TABS
100.0000 mg | ORAL_TABLET | Freq: Every evening | ORAL | 0 refills | Status: AC | PRN
  Filled 2023-02-24: qty 30, 30d supply, fill #0

## 2023-02-24 MED ORDER — LAMOTRIGINE 100 MG PO TABS
100.0000 mg | ORAL_TABLET | Freq: Two times a day (BID) | ORAL | 0 refills | Status: AC
  Filled 2023-02-24: qty 60, 30d supply, fill #0

## 2023-02-24 NOTE — Discharge Instructions (Signed)
Take tylenol 2 pills 4 times a day and motrin 4 pills 3 times a day.  Drink plenty of fluids.  Return for worsening shortness of breath, headache, confusion. Follow up with your family doctor.

## 2023-02-24 NOTE — ED Triage Notes (Signed)
Pt caox4, ambulatory NAD c/o headache, nausea, fatigue, sore throat, non-productive, nasal congestion, fever. Afebrile at present. Pt states she hasn't been on her meds since Sept d/t insurance reasons, requesting refill on meds as well.

## 2023-02-24 NOTE — ED Provider Notes (Signed)
[place] EMERGENCY DEPARTMENT AT [facility] Provider Note   CSN: [id number] Arrival date & time: 02/24/23  0913     History  Chief Complaint  Patient presents with   Cough    [name] is a 28 y.o. female.  28 yo F with a chief complaint of cough congestion going on for the better part of the week.  Her child has been sick with a similar illness this week.  Child was brought to the doctor and had a respiratory virus panel that was negative.  Apparently the flu is going through her child's daycare.  Has been able to eat and drink without issue.   Cough      Home Medications Prior to Admission medications   Medication Sig Start Date End Date Taking? Authorizing Provider  benzonatate (TESSALON) 100 MG capsule Take 1 capsule (100 mg total) by mouth every 8 (eight) hours. 02/24/23  Yes Melene Plan, DO  ondansetron (ZOFRAN-ODT) 4 MG disintegrating tablet 4mg  ODT q4 hours prn nausea/vomit 02/24/23  Yes Melene Plan, DO  albuterol (VENTOLIN HFA) 108 (90 Base) MCG/ACT inhaler Inhale 2 puffs into the lungs every 6 (six) hours as needed for wheezing or shortness of breath. 07/09/22   Cuthriell, Delorise Royals, PA-C  gabapentin (NEURONTIN) 300 MG capsule Take 1 capsule (300 mg total) by mouth 3 (three) times daily. 03/12/22   Poggi, Eileen Stanford E, PA-C  gabapentin (NEURONTIN) 300 MG capsule Take 1 capsule (300 mg total) by mouth 3 (three) times daily. 02/24/23 04/25/23  Melene Plan, DO  lamoTRIgine (LAMICTAL) 100 MG tablet Take 1 tablet (100 mg total) by mouth 2 (two) times daily. 02/24/23 04/25/23  Melene Plan, DO  lurasidone (LATUDA) 80 MG TABS tablet Take 1 tablet (80 mg total) by mouth daily with breakfast. 02/24/23 05/25/23  Melene Plan, DO  mirtazapine (REMERON SOL-TAB) 30 MG disintegrating tablet Take 1 tablet (30 mg total) by mouth at bedtime. 02/24/23 05/25/23  Melene Plan, DO  propranolol (INDERAL) 10 MG tablet Take 0.5 tablets (5 mg total) by mouth every 12 (twelve) hours. 02/24/23 05/25/23   Melene Plan, DO  topiramate (TOPAMAX) 50 MG tablet Take 1 tablet (50 mg total) by mouth daily. 02/24/23 05/25/23  Melene Plan, DO  traZODone (DESYREL) 100 MG tablet Take 1 tablet (100 mg total) by mouth at bedtime as needed for sleep. 02/24/23 05/25/23  Melene Plan, DO      Allergies    Penicillins, Latex, and Wound dressing adhesive    Review of Systems   Review of Systems  Respiratory:  Positive for cough.     Physical Exam Updated Vital Signs BP (!) 132/95 (BP Location: Right Arm)   Pulse 98   Temp 98.6 F (37 C)   Resp 17   Ht 5\' 6"  (1.676 m)   Wt 72.6 kg   LMP 01/19/2023 (Approximate)   SpO2 100%   BMI 25.82 kg/m  Physical Exam Vitals and nursing note reviewed.  Constitutional:      General: She is not in acute distress.    Appearance: She is well-developed. She is not diaphoretic.  HENT:     Head: Normocephalic and atraumatic.     Comments: Swollen turbinates, posterior nasal drip, tm normal bilaterally.   Eyes:     Pupils: Pupils are equal, round, and reactive to light.  Cardiovascular:     Rate and Rhythm: Normal rate and regular rhythm.     Heart sounds: No murmur heard.    No friction rub. No  gallop.  Pulmonary:     Effort: Pulmonary effort is normal.     Breath sounds: No wheezing or rales.  Abdominal:     General: There is no distension.     Palpations: Abdomen is soft.     Tenderness: There is no abdominal tenderness.  Musculoskeletal:        General: No tenderness.     Cervical back: Normal range of motion and neck supple.  Skin:    General: Skin is warm and dry.  Neurological:     Mental Status: She is alert and oriented to person, place, and time.  Psychiatric:        Behavior: Behavior normal.     ED Results / Procedures / Treatments   Labs (all labs ordered are listed, but only abnormal results are displayed) Labs Reviewed  RESP PANEL BY RT-PCR (RSV, FLU A&B, COVID)  [id number]    EKG None  Radiology No results  found.  Procedures Procedures    Medications Ordered in ED Medications - No data to display  ED Course/ Medical Decision Making/ A&P                                 Medical Decision Making Risk Prescription drug management.   28 yo F with a chief complaints of cough congestion going on for a week.  She is well-appearing and nontoxic.  Clear lung sounds on my exam no tachypnea do not feel she benefit from chest imaging.  COVID flu and RSV are negative.  Will treat supportively.  PCP follow-up.  11:53 AM:  I have discussed the diagnosis/risks/treatment options with the patient.  Evaluation and diagnostic testing in the emergency department does not suggest an emergent condition requiring admission or immediate intervention beyond what has been performed at this time.  They will follow up with PCP. We also discussed returning to the ED immediately if new or worsening sx occur. We discussed the sx which are most concerning (e.g., sudden worsening pain, fever, inability to tolerate by mouth) that necessitate immediate return. Medications administered to the patient during their visit and any new prescriptions provided to the patient are listed below.  Medications given during this visit Medications - No data to display   The patient appears reasonably screen and/or stabilized for discharge and I doubt any other medical condition or other [facility] requiring further screening, evaluation, or treatment in the ED at this time prior to discharge.          Final Clinical Impression(s) / ED Diagnoses Final diagnoses:  Viral URI with cough    Rx / DC Orders ED Discharge Orders          Ordered    lamoTRIgine (LAMICTAL) 100 MG tablet  2 times daily        02/24/23 1148    lurasidone (LATUDA) 80 MG TABS tablet  Daily with breakfast        02/24/23 1148    mirtazapine (REMERON SOL-TAB) 30 MG disintegrating tablet  Daily at bedtime        02/24/23 1148    propranolol (INDERAL) 10 MG  tablet  Every 12 hours        02/24/23 1148    topiramate (TOPAMAX) 50 MG tablet  Daily        02/24/23 1148    traZODone (DESYREL) 100 MG tablet  At bedtime PRN  02/24/23 1148    gabapentin (NEURONTIN) 300 MG capsule  3 times daily        02/24/23 1148    benzonatate (TESSALON) 100 MG capsule  Every 8 hours        02/24/23 1148    ondansetron (ZOFRAN-ODT) 4 MG disintegrating tablet        02/24/23 1148              [place], DO 02/24/23 1153

## 2023-03-06 ENCOUNTER — Other Ambulatory Visit (HOSPITAL_BASED_OUTPATIENT_CLINIC_OR_DEPARTMENT_OTHER): Payer: Self-pay
